# Patient Record
Sex: Male | Born: 1940
Health system: Southern US, Community
[De-identification: ages and names within clinical notes are randomized; demographics above are authoritative.]

## PROBLEM LIST (undated history)

## (undated) DIAGNOSIS — K8050 Calculus of bile duct without cholangitis or cholecystitis without obstruction: Secondary | ICD-10-CM

## (undated) DIAGNOSIS — E785 Hyperlipidemia, unspecified: Secondary | ICD-10-CM

## (undated) DIAGNOSIS — I219 Acute myocardial infarction, unspecified: Secondary | ICD-10-CM

## (undated) DIAGNOSIS — I209 Angina pectoris, unspecified: Secondary | ICD-10-CM

## (undated) DIAGNOSIS — I10 Essential (primary) hypertension: Secondary | ICD-10-CM

## (undated) DIAGNOSIS — E663 Overweight: Secondary | ICD-10-CM

## (undated) DIAGNOSIS — H269 Unspecified cataract: Secondary | ICD-10-CM

## (undated) DIAGNOSIS — H3530 Unspecified macular degeneration: Secondary | ICD-10-CM

## (undated) DIAGNOSIS — I2510 Atherosclerotic heart disease of native coronary artery without angina pectoris: Secondary | ICD-10-CM

## (undated) DIAGNOSIS — G459 Transient cerebral ischemic attack, unspecified: Secondary | ICD-10-CM

## (undated) DIAGNOSIS — K810 Acute cholecystitis: Secondary | ICD-10-CM

## (undated) HISTORY — DX: Unspecified macular degeneration: H35.30

## (undated) HISTORY — PX: CATARACT EXTRACTION W/ INTRAOCULAR LENS  IMPLANT, BILATERAL: SHX1307

## (undated) HISTORY — DX: Acute cholecystitis: K81.0

## (undated) HISTORY — PX: KNEE SURGERY: SHX244

## (undated) HISTORY — DX: Unspecified cataract: H26.9

## (undated) HISTORY — DX: Hyperlipidemia, unspecified: E78.5

## (undated) HISTORY — DX: Essential (primary) hypertension: I10

## (undated) HISTORY — DX: Overweight: E66.3

## (undated) HISTORY — DX: Atherosclerotic heart disease of native coronary artery without angina pectoris: I25.10

---
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
## 1978-11-12 HISTORY — PX: KNEE SURGERY: SHX244

## 1998-04-11 ENCOUNTER — Encounter: Payer: Self-pay | Admitting: Emergency Medicine

## 1998-04-11 ENCOUNTER — Emergency Department (HOSPITAL_COMMUNITY): Admission: EM | Admit: 1998-04-11 | Discharge: 1998-04-11 | Payer: Self-pay | Admitting: Emergency Medicine

## 2000-08-17 ENCOUNTER — Emergency Department (HOSPITAL_COMMUNITY): Admission: EM | Admit: 2000-08-17 | Discharge: 2000-08-17 | Payer: Self-pay

## 2003-01-28 ENCOUNTER — Emergency Department (HOSPITAL_COMMUNITY): Admission: EM | Admit: 2003-01-28 | Discharge: 2003-01-28 | Payer: Self-pay | Admitting: Emergency Medicine

## 2005-03-13 DIAGNOSIS — I2510 Atherosclerotic heart disease of native coronary artery without angina pectoris: Secondary | ICD-10-CM

## 2005-03-13 DIAGNOSIS — I219 Acute myocardial infarction, unspecified: Secondary | ICD-10-CM

## 2005-03-13 HISTORY — DX: Atherosclerotic heart disease of native coronary artery without angina pectoris: I25.10

## 2005-03-13 HISTORY — DX: Acute myocardial infarction, unspecified: I21.9

## 2005-03-16 ENCOUNTER — Inpatient Hospital Stay (HOSPITAL_COMMUNITY): Admission: EM | Admit: 2005-03-16 | Discharge: 2005-03-19 | Payer: Self-pay | Admitting: Emergency Medicine

## 2005-03-17 ENCOUNTER — Encounter: Payer: Self-pay | Admitting: Cardiology

## 2005-03-17 HISTORY — PX: CARDIAC CATHETERIZATION: SHX172

## 2006-10-23 ENCOUNTER — Ambulatory Visit: Payer: Self-pay | Admitting: Internal Medicine

## 2006-11-06 ENCOUNTER — Ambulatory Visit: Payer: Self-pay | Admitting: Internal Medicine

## 2006-11-06 ENCOUNTER — Encounter: Payer: Self-pay | Admitting: Internal Medicine

## 2007-09-24 ENCOUNTER — Encounter: Payer: Self-pay | Admitting: Cardiology

## 2008-03-13 DIAGNOSIS — H269 Unspecified cataract: Secondary | ICD-10-CM

## 2008-03-13 HISTORY — DX: Unspecified cataract: H26.9

## 2010-04-25 ENCOUNTER — Ambulatory Visit (INDEPENDENT_AMBULATORY_CARE_PROVIDER_SITE_OTHER): Payer: Medicare Other | Admitting: Cardiology

## 2010-04-25 DIAGNOSIS — I252 Old myocardial infarction: Secondary | ICD-10-CM

## 2010-04-25 DIAGNOSIS — E7800 Pure hypercholesterolemia, unspecified: Secondary | ICD-10-CM

## 2010-04-25 DIAGNOSIS — I2510 Atherosclerotic heart disease of native coronary artery without angina pectoris: Secondary | ICD-10-CM

## 2010-04-25 DIAGNOSIS — I10 Essential (primary) hypertension: Secondary | ICD-10-CM

## 2010-07-29 NOTE — Discharge Summary (Signed)
[name], [name]              ACCOUNT NO.:  [id number]   MEDICAL RECORD NO.:  [id number]          PATIENT TYPE:  INP   LOCATION:  3735                         FACILITY:  [facility]   PHYSICIAN:  Marcie Mowers, M.D.DATE OF BIRTH:  [birth year]   DATE OF ADMISSION:  03/16/2005  DATE OF DISCHARGE:  03/19/2005                                 DISCHARGE SUMMARY   PRIMARY CARE PHYSICIAN:  The patient does not have a primary care physician.  He does plan to see Dr. Jarome Matin one week after discharge.  He has  the contact information and will call and make appointment.   DISCHARGE DIAGNOSIS:  1.  Acute non-ST-elevation myocardial infarction.  2.  Dyslipidemia.  3.  Overweight with body mass index of 29.  4.  Low-grade fever.   DISCHARGE MEDICATIONS:  1.  Metoprolol 12.5 mg 1 pill twice daily.  2.  Aspirin 325 mg 1 pill daily.  3.  Zocor 40 mg p.o. daily.  4.  Avelox 400 mg 1 pill daily for 7 days.  5.  Sublingual nitroglycerin tablets 0.4 mg 1 pill to be repeated every 5      minutes for a total of 3 times for acute chest pain.   CONSULTATIONS:  Kindly provided by Dr. Peter Swaziland, cardiology.   DISPOSITION:  The patient is discharged to home to follow up with his  primary care physician.   PROCEDURES:  The patient underwent a cardiac catheterization on March 17, 2005.  The findings were as follows.  1.  Left main coronary normal.  2.  A 50 to 70% stenosis of mid LAD.  3.  Occlusion of left circumflex coronary artery without collateral flow.  4.  Right coronary artery large and dominant and normal in appearance.  5.  Focal hypokinesia of mid inferior lateral wall with overall well      preserved left ventricular systolic function and ejection fraction of      55%.   The left circumflex coronary artery was thought to be the culprit lesion.   HISTORY AND PHYSICAL:  For detailed History and Physical, please refer to  the note from March 16, 2004.  Briefly, a  70 year old Caucasian male was  admitted secondary to 3-day history of atypical chest pain which was  intermittent in nature with periodic sharp and dull episodes, and there was  some radiation to the right upper quadrant.  This was not associated with  any nausea, vomiting, or diaphoresis.  The patient's risk factors include  age, family history.   LABORATORY DATA:  For admission lab data, please refer to the H&P note.  Overall, initially 3 sets of cardiac enzymes were ordered.  By the third  set, his troponin had turned positive at 1.35 units.  At this time, the  patient was still with chest pain.  TSH was normal at 0.952.  A fasting  lipid profile was abnormal with total cholesterol of 129, HDL very low at  29, triglycerides at 65 mg/dl, and LDL cholesterol normal at 97 mg/dl.  Urinalysis done on the day of December was normal.  There was some increase  in his white blood cell count on the day of discharge with white blood cell  count being 14,000, hemoglobin 13.5, hematocrit 44.3, platelet count of  177,000.  His basic metabolic panel was within normal limits.   Radiology data: Chest x-ray obtained on March 16, 2005, showed bibasilar  atelectasis with low lung volumes, mild cardiomegaly.  Number 2 chest x-ray  done PA and lateral March 19, 2005, showed improved aeration in lower lobes  with residual left retrocardiac subsegmental atelectasis versus infiltrate  and stable mild cardiomegaly.   HOSPITAL COURSE:  #1.  NON-ST-ELEVATION MYOCARDIAL INFARCTION:  With the chief complaint of  chest pain, the patient was admitted to telemetry.  With cardiac enzymes  turning positive, cardiology was consulted, and the patient underwent  cardiac catheterization with results explained above.  The patient was  medically managed.  He was started on metoprolol, aspirin.  He was placed on  heparin drip until he underwent cardiac catheterization, and he was also  started on a statin.  Overall, his  hospital stay was uneventful.  He did  well after the cardiac catheterization.  His chest pain had resolved by the  day of discharge.  The patient will follow up with Dr. Swaziland in one week  after discharge.   #2.  PYREXIA: The patient had a low-grade temperature of 100 degrees F on  the day of discharge.  Therefore, urinalysis and chest x-ray were obtained  with results as explained above.  The patient did not have any new symptoms  like cough, headache, chest pain, shortness of breath, abdominal complaints,  urinary complaints, etc.  Given no clinical symptoms, only a lab finding of  leukocytosis and low-grade temperature, chest x-ray finding of possible  infiltrate which was improving, the patient was discharged with 7-day course  of Avelox p.o.   #3.  DYSLIPIDEMIA:  The patient was found to have a low HDL of 29.  The  patient was started initially on Crestor, being discharged on Zocor.   Up until this current admission, the patient did not have a primary care  physician and did not have regular medical followup.  The patient does plan  to see Dr. Jarome Matin, has contact information, and will make an  appointment to see him one week after discharge.  The patient will also  follow up with Dr. Swaziland one week after discharge.           ______________________________  Marcie Mowers, M.D.     PMJ/MEDQ  D:  03/19/2005  T:  03/19/2005  Job:  [id number]   cc:   Barry Dienes. Eloise Harman, M.D.  Fax: [phone]   Peter M. Swaziland, M.D.  Fax: [phone]

## 2010-07-29 NOTE — Cardiovascular Report (Signed)
[name], [name]              ACCOUNT NO.:  [id number]   MEDICAL RECORD NO.:  [id number]          PATIENT TYPE:  INP   LOCATION:  3735                         FACILITY:  [facility]   PHYSICIAN:  Peter M. Swaziland, M.D.  DATE OF BIRTH:  1940-12-24   DATE OF PROCEDURE:  03/17/2005  DATE OF DISCHARGE:                              CARDIAC CATHETERIZATION   INDICATIONS FOR PROCEDURE:  The patient is a 70 year old white male who  presents with a non-Q-wave myocardial infarction.  No prior history of  coronary disease or family history of early coronary disease   PROCEDURE:  Left heart catheterization, coronary left ventricular  angiography.   EQUIPMENT USED:  6-French 4 cm right and left Judkins catheter, 6-  Frenchpigtail catheter, 6-French arterial sheath.   ACCESS:  Via the right femoral artery using standard Seldinger technique.   MEDICATIONS:  Local anesthesia 1% Xylocaine.   CONTRAST:  150 mL of Omnipaque.   HEMODYNAMIC DATA:  Aortic pressure was 135/77 with a mean of 101.  Left  ventricle pressure is 137 with EDP of 19 mmHg.   ANGIOGRAPHIC DATA:  The left coronary arises and distributes normally.   Left main coronary appears normal.   The left anterior descending artery has 50-70% stenosis in the mid vessel  and is somewhat eccentric.  The first diagonal is without significant  disease.   The left circumflex coronary is a moderate size vessel.  It is occluded in  the mid vessel without collateral flow   The right coronary artery is a large dominant vessel and appears normal.   Left ventricular angiography was performed in RAO and LAO cranial views.  This demonstrates focal hypokinesia of the mid inferior lateral wall with  overall well-preserved left ventricular systolic function.  Ejection  fraction is estimated at 55%. There is no mitral regurgitation or prolapse.   FINAL INTERPRETATION:  1.  Single-vessel obstructive atherosclerotic coronary disease with total  occlusion of the mid left circumflex      coronary. There is borderline obstructive disease in the mid left      anterior descending.  2.  Well-preserved left ventricular systolic function.   PLAN:  Would recommend medical therapy.           ______________________________  Peter M. Swaziland, M.D.     PMJ/MEDQ  D:  03/17/2005  T:  03/18/2005  Job:  [id number]   cc:   Isidor Holts, M.D.

## 2010-07-29 NOTE — Consult Note (Signed)
[name], [name]              ACCOUNT NO.:  [id number]   MEDICAL RECORD NO.:  [id number]          PATIENT TYPE:  INP   LOCATION:  3735                         FACILITY:  [facility]   PHYSICIAN:  Peter M. Swaziland, M.D.  DATE OF BIRTH:  06/08/1940   DATE OF CONSULTATION:  03/17/2005  DATE OF DISCHARGE:                                   CONSULTATION   HISTORY OF PRESENT ILLNESS:  Mr. Lipsky is a very pleasant 70 year old  white male previously healthy, who presented to the emergency room  department last night for evaluation of chest pain.  Patient reports he had  a GI illness approximately six days ago with nausea, vomiting and diarrhea.  This resolved over the next two days.  Yesterday, he developed acute onset  of lower substernal and epigastric pain and fullness.  There was some  radiation to his right pectoral region.  He had no associated shortness of  breath.  He did have some diaphoresis.  He states the severe pain lasts  approximately 30 minutes.  He came to the emergency room.  He received  sublingual and IV nitroglycerin without relief.  He did finally get relief  after IV morphine.  He had no further chest pain during the night.  Patient  has no history of hypertension or diabetes.  His cholesterol status has been  unknown.  He has no primary physician.   PAST MEDICAL HISTORY:  His past medical history is benign.  He does have a  history of arthritis.   MEDICATIONS:  He has been on no medications.   ALLERGIES:  NO KNOWN ALLERGIES.   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  He is single and has no children.  He denies tobacco or  alcohol use.   FAMILY HISTORY:  Father died at age 54 with myocardial infarction.  His  mother and three sisters are alive and well.   REVIEW OF SYSTEMS:  He has had no further diarrhea or abdominal pain.  He  has had no history of bleeding.  No history of TIA or stroke.  Denies any  edema,  orthopnea, or PND.  Other review of systems are negative.   PHYSICAL EXAMINATION:  GENERAL APPEARANCE:  The patient is an obese white  male in no apparent distress.  VITAL SIGNS:  Blood pressure is 101/60, pulse is 50 and sinus rhythm.  He is  afebrile.  HEENT:  Normocephalic and atraumatic.  Pupils are equal, round, reactive to  light and accommodation.  Extraocular movements are full.  Oropharynx is  clear.  NECK:  Supple without JVD, adenopathy, thyromegaly or bruits.  LUNGS:  Clear to auscultation and percussion.  CARDIOVASCULAR:  Regular rate and rhythm without murmurs, rubs, gallops, or  clicks.  ABDOMEN:  Soft and nontender and obese.  He has no masses or  hepatosplenomegaly.  EXTREMITIES:  His femoral and pedal pulses are 2+ and symmetric.  He has no  edema or cyanosis.  NEUROLOGIC:  Nonfocal.   LABORATORY DATA:  His chest x-ray shows mild cardiomegaly with  bibasilar  atelectasis.   ECG on admission does show some subtle ST elevation inferiorly.  His ECG is  now normal.   Point of care cardiac enzymes were negative x3, but subsequent CPK was 384  with 66.2 MB and troponin was elevated at 1.35.  CBC, BMET and coags are  normal.  BNP level is 29.  D-dimer level is less than 0.22.   IMPRESSION:  1.  Non-Q-wave myocardial infarction.  2.  Obesity.   PLAN:  Will continue on aspirin, IV nitroglycerin and heparin and low dose  beta-blockade.  Will check his lipid status.  Will plan cardiac  catheterization with possible intervention later today.           ______________________________  Peter M. Swaziland, M.D.     PMJ/MEDQ  D:  03/17/2005  T:  03/17/2005  Job:  [id number]   cc:   Isidor Holts, M.D.

## 2010-07-29 NOTE — H&P (Signed)
[name], [name]              ACCOUNT NO.:  [id number]   MEDICAL RECORD NO.:  [id number]          PATIENT TYPE:  EMS   LOCATION:  MAJO                         FACILITY:  [facility]   PHYSICIAN:  Danae Chen, M.D.DATE OF BIRTH:  [birth year]   DATE OF ADMISSION:  03/16/2005  DATE OF DISCHARGE:                                HISTORY & PHYSICAL   PRIMARY CARE PHYSICIAN:  Unassigned.   CHIEF COMPLAINT:  Chest pain.   HISTORY OF PRESENT ILLNESS:  Patient is a pleasant 70 year old gentleman  with no primary care physician who has not sought any medical attention in  many years who presents with a three-day history of nonspecific chest pain.  He describes it as intermittent in nature, sometimes sharp and sometimes  dull in the right upper quadrant.  This chest pain is non-radiating, is not  associated with nausea, vomiting, or diaphoresis.  Not associated with  exertion.  He does get some relief with antacid use.  Cardiac risk factors  include age.  He has never smoked and quit drinking 15 years ago.  Is  unaware whether he has high cholesterol.  No history of hypertension or  diabetes to his knowledge.  His father did die of a heart attack at age 64.  He has siblings which are still alive with no evidence of heart disease that  he is aware of.  He has no recent weight gain or weight loss; however, four  days prior to the onset of his chest discomfort he did have a viral  gastroenteritis with some diarrhea and decreased appetite.   PAST MEDICAL HISTORY:  As noted above.   PAST SURGICAL HISTORY:  Left knee replacement.   FAMILY HISTORY:  Father died of coronary artery disease at age 66.   SOCIAL HISTORY:  He has never smoked and quit drinking 15 years ago.  He is  single, never married.  No children.  He is a retired Academic librarian.   REVIEW OF SYSTEMS:  Per the HPI.   PHYSICAL EXAMINATION:  GENERAL:  Alert and oriented, no acute distress.  VITAL  SIGNS:  Temperature 97.5, blood pressure 120/72, pulse 73, O2  saturation is 97% on room air.  HEENT:  Oropharynx is clear.  Neck is supple.  No lymphadenopathy.  Pupils  are equal and reactive.  LUNGS:  Clear to auscultation.  HEART:  Regular with normal S1, S2.  No murmurs appreciated.  ABDOMEN:  Soft, nontender.  No rebound.  No guarding.  He has no chest wall  tenderness to palpation.  EXTREMITIES:  He has 2+ femoral pulses, 2+ dorsalis pedis pulses.  No  peripheral edema.  NEUROLOGIC:  Nonfocal.   EKG showing normal sinus rhythm.   LABORATORIES:  Sodium 136, potassium 3.7, BUN 15, creatinine 1.1.  Initial  point of care cardiac enzymes:  Myoglobin 136, CK-MB 1.7, troponin less than  0.05.  Second set troponin less than 0.05.  White count of 8.6, hemoglobin  15.9, platelet of 214.  INR of 1.  Chest x-ray showing some bibasilar  atelectasis with mild cardiomegaly.   IMPRESSION:  71 year old with minimal cardiac risk factors presenting with  atypical chest pain.  Given the patient's age and family history it is  advisable to admit the patient to telemetry, get serial cardiac enzymes,  place him on low dose beta blocker, aspirin, pain control, oxygen, and  telemetry monitoring.  In the morning we will ask cardiology to see him for  risk stratification.  He is a good candidate for a Cardiolite study.  Will  keep him n.p.o. after midnight for that.  Also check a fasting lipid panel  and a thyroid panel as well and check a 2-D echocardiogram.      Danae Chen, M.D.  Electronically Signed     RLK/MEDQ  D:  03/16/2005  T:  03/17/2005  Job:  [id number]

## 2010-08-05 ENCOUNTER — Other Ambulatory Visit: Payer: Self-pay | Admitting: *Deleted

## 2010-08-05 MED ORDER — SIMVASTATIN 80 MG PO TABS: 80.0000 mg | ORAL_TABLET | Freq: Every day | ORAL | Status: DC

## 2010-08-05 MED ORDER — METOPROLOL SUCCINATE ER 50 MG PO TB24: 50.0000 mg | ORAL_TABLET | Freq: Every day | ORAL | Status: DC

## 2010-08-05 MED ORDER — RAMIPRIL 10 MG PO CAPS: 10.0000 mg | ORAL_CAPSULE | Freq: Every day | ORAL | Status: DC

## 2010-08-05 NOTE — Telephone Encounter (Signed)
escribe medication per fax request  

## 2011-03-09 ENCOUNTER — Emergency Department (HOSPITAL_COMMUNITY)
Admission: EM | Admit: 2011-03-09 | Discharge: 2011-03-09 | Disposition: A | Discharge status: Home / Self Care | Payer: Medicare Other | Source: Home / Self Care

## 2011-03-09 ENCOUNTER — Encounter: Payer: Self-pay | Admitting: Emergency Medicine

## 2011-03-09 DIAGNOSIS — R05 Cough: Secondary | ICD-10-CM

## 2011-03-09 DIAGNOSIS — J069 Acute upper respiratory infection, unspecified: Secondary | ICD-10-CM

## 2011-03-09 HISTORY — DX: Acute myocardial infarction, unspecified: I21.9

## 2011-03-09 MED ORDER — BENZONATATE 100 MG PO CAPS: ORAL_CAPSULE | ORAL | Status: AC

## 2011-03-09 NOTE — ED Notes (Signed)
Monday night reports chills, "just could't get warm". Then developed a tickle in throat thought to be drainage in throat.   The next day had a cough that has continued.

## 2011-03-09 NOTE — ED Provider Notes (Signed)
Medical screening examination/treatment/procedure(s) were performed by non-physician practitioner and as supervising physician I was immediately available for consultation/collaboration.   KINDL,JAMES DOUGLAS MD.    James Douglas Kindl, MD 03/09/11 1617 

## 2011-03-09 NOTE — ED Provider Notes (Signed)
History     CSN: [id number]  Arrival date & time 03/09/11  1055   None     Chief Complaint  Patient presents with  . URI    (Consider location/radiation/quality/duration/timing/severity/associated sxs/prior treatment) HPI Comments: Pt states she felt chilled 3 nights ago. The next day developed post nasal drainage which is causing a tickle in his throat and making him cough. "Feels just like a feather." The cough is nonproductive. No fever. His symptoms improve with use of cough drops. He was unable to get an appt with his PCP today and is concerned that he may be getting the flu. No dyspnea or wheezing.   The history is provided by the patient.    Past Medical History  Diagnosis Date  . Myocardial infarction   . High cholesterol     Past Surgical History  Procedure Date  . Knee surgery     History reviewed. No pertinent family history.  History  Substance Use Topics  . Smoking status: Never Smoker   . Smokeless tobacco: Not on file  . Alcohol Use: No      Review of Systems  Constitutional: Positive for chills. Negative for fever.  HENT: Positive for postnasal drip. Negative for ear pain, congestion, sore throat, rhinorrhea, sneezing, trouble swallowing and sinus pressure.   Respiratory: Positive for cough. Negative for shortness of breath and wheezing.   Cardiovascular: Negative for chest pain and leg swelling.    Allergies  Review of patient's allergies indicates no known allergies.  Home Medications   Current Outpatient Rx  Name Route Sig Dispense Refill  . ASPIRIN 81 MG PO TABS Oral Take 81 mg by mouth daily.      Marland Kitchen BENZONATATE 100 MG PO CAPS  1-2 caps every 8 hrs prn cough 30 capsule 0  . METOPROLOL SUCCINATE ER 50 MG PO TB24 Oral Take 1 tablet (50 mg total) by mouth daily. 90 tablet 3  . RAMIPRIL 10 MG PO CAPS Oral Take 1 capsule (10 mg total) by mouth daily. 90 capsule 3  . SIMVASTATIN 80 MG PO TABS Oral Take 1 tablet (80 mg total) by mouth at  bedtime. 90 tablet 3    BP 144/84  Pulse 85  Temp(Src) 98.4 F (36.9 C) (Oral)  Resp 16  SpO2 96%  Physical Exam  Nursing note and vitals reviewed. Constitutional: He appears well-developed and well-nourished. No distress.  HENT:  Head: Normocephalic and atraumatic.  Right Ear: Tympanic membrane, external ear and ear canal normal.  Left Ear: Tympanic membrane, external ear and ear canal normal.  Nose: Nose normal.  Mouth/Throat: Uvula is midline, oropharynx is clear and moist and mucous membranes are normal. No oropharyngeal exudate, posterior oropharyngeal edema or posterior oropharyngeal erythema.  Neck: Neck supple.  Cardiovascular: Normal rate, regular rhythm and normal heart sounds.   Pulmonary/Chest: Effort normal and breath sounds normal. No respiratory distress.       No cough noted during visit  Lymphadenopathy:    He has no cervical adenopathy.  Neurological: He is alert.  Skin: Skin is warm and dry.  Psychiatric: He has a normal mood and affect.    ED Course  Procedures (including critical care time)  Labs Reviewed - No data to display No results found.   1. Acute URI   2. Cough       MDM  Exam neg, afebrile.         Melody Comas, [place] 03/09/11 1550

## 2011-03-09 NOTE — ED Notes (Signed)
Provided coke at patient request, ice as well

## 2011-04-26 DIAGNOSIS — H3530 Unspecified macular degeneration: Secondary | ICD-10-CM | POA: Diagnosis not present

## 2011-04-26 DIAGNOSIS — Z961 Presence of intraocular lens: Secondary | ICD-10-CM | POA: Diagnosis not present

## 2011-04-26 DIAGNOSIS — H26499 Other secondary cataract, unspecified eye: Secondary | ICD-10-CM | POA: Diagnosis not present

## 2011-05-25 ENCOUNTER — Ambulatory Visit: Payer: Medicare Other | Admitting: Cardiology

## 2011-05-26 ENCOUNTER — Encounter: Payer: Self-pay | Admitting: Cardiology

## 2011-05-26 ENCOUNTER — Ambulatory Visit (INDEPENDENT_AMBULATORY_CARE_PROVIDER_SITE_OTHER): Payer: Medicare Other | Admitting: Cardiology

## 2011-05-26 VITALS — BP 154/82 | HR 79 | Ht 74.0 in | Wt 238.0 lb

## 2011-05-26 DIAGNOSIS — I10 Essential (primary) hypertension: Secondary | ICD-10-CM

## 2011-05-26 DIAGNOSIS — I219 Acute myocardial infarction, unspecified: Secondary | ICD-10-CM | POA: Insufficient documentation

## 2011-05-26 DIAGNOSIS — I2510 Atherosclerotic heart disease of native coronary artery without angina pectoris: Secondary | ICD-10-CM | POA: Diagnosis not present

## 2011-05-26 DIAGNOSIS — E785 Hyperlipidemia, unspecified: Secondary | ICD-10-CM | POA: Diagnosis not present

## 2011-05-26 NOTE — Assessment & Plan Note (Signed)
Continue simvastatin. We'll followup on his fasting lab work in 3 weeks.

## 2011-05-26 NOTE — Patient Instructions (Signed)
We will schedule you for a nuclear stress test.  Continue your current medication.  I will see you back in 6 months.

## 2011-05-26 NOTE — Assessment & Plan Note (Signed)
Blood pressure is elevated today but he reports it has been better. He can reassess when he comes in for his stress test and he has a visit with Dr. Wylene Simmer in 3 weeks. If it remains elevated we may need to up his medical therapy.

## 2011-05-26 NOTE — Progress Notes (Signed)
[name] Date of Birth: October 29, 1940 Medical Record #[id number]  History of Present Illness: [name] is seen today for followup. He has a known history of coronary disease with a myocardial infarction in 2007. Cardiac catheterization at that time demonstrated occlusion of the mid left circumflex coronary. He had a 50-70% stenosis in the mid LAD that was eccentric. He continues to do very well. He has had no significant chest pain or shortness of breath. He walks 4-5 miles a day 4 days a week. He denies any palpitations or edema. He thinks that his blood pressure has been under control. He is scheduled for complete physical in 3 weeks with Dr. Wylene Simmer and will have blood work done at that time. His last stress test was in July of 2009. He was able to walk 8 minutes on the Bruce protocol. He had 1 mm of ST depression in leads 2, V4 through V6. His Myoview study showed some infarct and ischemia in the inferior lateral wall segments. Ejection fraction was 54%.  Current Outpatient Prescriptions on File Prior to Visit  Medication Sig Dispense Refill  . aspirin 81 MG tablet Take 81 mg by mouth daily.        . metoprolol (TOPROL XL) 50 MG 24 hr tablet Take 1 tablet (50 mg total) by mouth daily.  90 tablet  3  . ramipril (ALTACE) 10 MG capsule Take 1 capsule (10 mg total) by mouth daily.  90 capsule  3  . simvastatin (ZOCOR) 80 MG tablet Take 1 tablet (80 mg total) by mouth at bedtime.  90 tablet  3    No Known Allergies  Past Medical History  Diagnosis Date  . Myocardial infarction 1/07    Acute non-ST-elevation myocardial infarction  . Dyslipidemia   . Overweight     with body mass index of 29  . HTN (hypertension)   . CAD (coronary artery disease) 2007    occlusion of mid LCX, 50-70% LAD    Past Surgical History  Procedure Date  . Knee surgery     arthroscopic left knee surgery  . Cardiac catheterization 03/17/2005    Ejection fraction is estimated at 55%    History    Smoking status  . Never Smoker   Smokeless tobacco  . Not on file    History  Alcohol Use No    Family History  Problem Relation Age of Onset  . Heart disease Father     Review of Systems: As noted in history of present illness.  All other systems were reviewed and are negative.  Physical Exam: BP 154/82  Pulse 79  Ht 6\' 2"  (1.88 m)  Wt 238 lb (107.956 kg)  BMI 30.56 kg/m2 The patient is alert and oriented x 3.  The mood and affect are normal.  The skin is warm and dry.  Color is normal.  The HEENT exam reveals that the sclera are nonicteric.  The mucous membranes are moist.  The carotids are 2+ without bruits.  There is no thyromegaly.  There is no JVD.  The lungs are clear.  The chest wall is non tender.  The heart exam reveals a regular rate with a normal S1 and S2.  There are no murmurs, gallops, or rubs.  The PMI is not displaced.   Abdominal exam reveals good bowel sounds.  There is no guarding or rebound.  There is no hepatosplenomegaly or tenderness.  There are no masses.  Exam of the legs reveal no  clubbing, cyanosis, or edema.  The legs are without rashes.  The distal pulses are intact.  Cranial nerves II - XII are intact.  Motor and sensory functions are intact.  The gait is normal.  LABORATORY DATA: ECG today demonstrates normal sinus rhythm with a normal ECG.  Assessment / Plan:

## 2011-05-26 NOTE — Assessment & Plan Note (Signed)
He has been occlusion of the midcircumflex. He has been asymptomatic. He is due for followup stress test and we will schedule him for a stress Myoview study. Understandably this will continue to show ischemia in the inferior lateral distribution but we are really concerned about any new perfusion abnormality.

## 2011-06-08 DIAGNOSIS — I10 Essential (primary) hypertension: Secondary | ICD-10-CM | POA: Diagnosis not present

## 2011-06-08 DIAGNOSIS — E785 Hyperlipidemia, unspecified: Secondary | ICD-10-CM | POA: Diagnosis not present

## 2011-06-08 DIAGNOSIS — R7301 Impaired fasting glucose: Secondary | ICD-10-CM | POA: Diagnosis not present

## 2011-06-08 DIAGNOSIS — Z125 Encounter for screening for malignant neoplasm of prostate: Secondary | ICD-10-CM | POA: Diagnosis not present

## 2011-06-15 DIAGNOSIS — Z1212 Encounter for screening for malignant neoplasm of rectum: Secondary | ICD-10-CM | POA: Diagnosis not present

## 2011-06-16 ENCOUNTER — Other Ambulatory Visit: Payer: Self-pay | Admitting: Cardiology

## 2011-06-16 DIAGNOSIS — I2510 Atherosclerotic heart disease of native coronary artery without angina pectoris: Secondary | ICD-10-CM | POA: Diagnosis not present

## 2011-06-16 DIAGNOSIS — Z125 Encounter for screening for malignant neoplasm of prostate: Secondary | ICD-10-CM | POA: Diagnosis not present

## 2011-06-16 DIAGNOSIS — E785 Hyperlipidemia, unspecified: Secondary | ICD-10-CM | POA: Diagnosis not present

## 2011-06-16 DIAGNOSIS — Z0000 Encounter for general adult medical examination without abnormal findings: Secondary | ICD-10-CM | POA: Diagnosis not present

## 2011-06-16 DIAGNOSIS — I10 Essential (primary) hypertension: Secondary | ICD-10-CM | POA: Diagnosis not present

## 2011-07-04 ENCOUNTER — Other Ambulatory Visit: Payer: Self-pay | Admitting: *Deleted

## 2011-07-04 MED ORDER — RAMIPRIL 10 MG PO CAPS: 10.0000 mg | ORAL_CAPSULE | Freq: Every day | ORAL | Status: DC

## 2011-07-04 MED ORDER — METOPROLOL SUCCINATE ER 50 MG PO TB24: 50.0000 mg | ORAL_TABLET | Freq: Every day | ORAL | Status: DC

## 2011-07-04 MED ORDER — SIMVASTATIN 80 MG PO TABS: 80.0000 mg | ORAL_TABLET | Freq: Every day | ORAL | Status: DC

## 2011-07-05 ENCOUNTER — Other Ambulatory Visit: Payer: Self-pay | Admitting: Cardiology

## 2011-07-05 NOTE — Telephone Encounter (Signed)
Pt needs refill of metoprolol 50mg , ramipril 10mg , and simvastatin 80mg  called into [facility]

## 2011-07-10 ENCOUNTER — Encounter (HOSPITAL_COMMUNITY): Payer: Medicare Other

## 2011-08-08 ENCOUNTER — Ambulatory Visit (HOSPITAL_COMMUNITY): Payer: Medicare Other

## 2011-08-14 ENCOUNTER — Ambulatory Visit (HOSPITAL_COMMUNITY): Payer: Medicare Other | Attending: Cardiology | Admitting: Radiology

## 2011-08-14 VITALS — BP 163/93 | Ht 74.0 in | Wt 234.0 lb

## 2011-08-14 DIAGNOSIS — R079 Chest pain, unspecified: Secondary | ICD-10-CM

## 2011-08-14 DIAGNOSIS — I2510 Atherosclerotic heart disease of native coronary artery without angina pectoris: Secondary | ICD-10-CM | POA: Insufficient documentation

## 2011-08-14 DIAGNOSIS — I252 Old myocardial infarction: Secondary | ICD-10-CM | POA: Insufficient documentation

## 2011-08-14 DIAGNOSIS — I10 Essential (primary) hypertension: Secondary | ICD-10-CM | POA: Diagnosis not present

## 2011-08-14 DIAGNOSIS — R61 Generalized hyperhidrosis: Secondary | ICD-10-CM | POA: Diagnosis not present

## 2011-08-14 DIAGNOSIS — E785 Hyperlipidemia, unspecified: Secondary | ICD-10-CM | POA: Insufficient documentation

## 2011-08-14 DIAGNOSIS — Z8249 Family history of ischemic heart disease and other diseases of the circulatory system: Secondary | ICD-10-CM | POA: Diagnosis not present

## 2011-08-14 MED ORDER — TECHNETIUM TC 99M TETROFOSMIN IV KIT
11.0000 | PACK | Freq: Once | INTRAVENOUS | Status: AC | PRN
  Administered 2011-08-14: 11 via INTRAVENOUS

## 2011-08-14 MED ORDER — TECHNETIUM TC 99M TETROFOSMIN IV KIT
33.0000 | PACK | Freq: Once | INTRAVENOUS | Status: AC | PRN
  Administered 2011-08-14: 33 via INTRAVENOUS

## 2011-08-14 NOTE — Progress Notes (Signed)
[facility] SITE 3 NUCLEAR MED [address] [place] [address] [phone]  Cardiology Nuclear Med Study  [name] is a 71 y.o. male     MRN : [id number]     DOB: April 21, 1940  Procedure Date: 08/14/2011  Nuclear Med Background Indication for Stress Test:  Evaluation for Ischemia with history of Known ischemia in inferolateral region History:  '07 MI,Heart Catherization:CFX occluded and LAD 50-70% eccentric and EF=55%;Echo:EF=55-65% and '09 MPS:EF=54% and infarct/ischemia of inferolateral wall Cardiac Risk Factors: Family History - CAD, Hypertension and Lipids  Symptoms:  Diaphoresis   Nuclear Pre-Procedure Caffeine/Decaff Intake:  None > 12 hrs NPO After: 7:00pm   Lungs:  clear  IV 0.9% NS with Angio Cath:  20g  IV Site: R Hand x 1, tolerated well IV Started by:  Irean Hong, RN  Chest Size (in):  46 Cup Size: n/a  Height: 6\' 2"  (1.88 m)  Weight:  234 lb (106.142 kg)  BMI:  Body mass index is 30.04 kg/(m^2). Tech Comments:  Held toprol x 24 hrs    Nuclear Med Study 1 or 2 day study: 1 day  Stress Test Type:  Stress  Reading MD: Willa Rough, MD  Order Authorizing Provider:  Peter Swaziland, MD  Resting Radionuclide: Technetium 58m Tetrofosmin  Resting Radionuclide Dose: 11.0 mCi   Stress Radionuclide:  Technetium 60m Tetrofosmin  Stress Radionuclide Dose: 33.0 mCi           Stress Protocol Rest HR: 77 Stress HR: 136  Rest BP: 163/93 Stress BP: 206/94  Exercise Time (min): 6:49 METS: 7.0   Predicted Max HR: 148 bpm % Max HR: 91.89 bpm Rate Pressure Product: 84696   Dose of Adenosine (mg):  n/a Dose of Lexiscan: n/a mg  Dose of Atropine (mg): n/a Dose of Dobutamine: n/a mcg/kg/min (at max HR)  Stress Test Technologist: Cathlyn Parsons, RN  Nuclear Technologist:  Domenic Polite, CNMT     Rest Procedure:  Myocardial perfusion imaging was performed at rest 45 minutes following the intravenous administration of Technetium 17m  Tetrofosmin. Rest ECG: NSR - Normal EKG  Stress Procedure:  The patient performed treadmill exercise using a Bruce  Protocol for 6:49 minutes. The patient stopped due to fatigue and patient had chest tightness 2/10.  There were  ST-T wave changes noted. Patient had PVC's with couplets and rare PAC. Technetium 21m Tetrofosmin was injected at peak exercise and myocardial perfusion imaging was performed after a brief delay. Stress ECG: There is mild inferolateral ST depression that occurs at peak stress and in the early recovery period.  QPS Raw Data Images:  Patient motion noted; appropriate software correction applied. Stress Images:  There is a moderate area of myocardium affected  with decreased activity with stress. This includes moderate decreased activity at the base of the inferior wall, moderate to severe decreased activity at the base and mid and apical inferolateral wall. There is also mild to moderate decreased activity at the base and mid anterolateral wall. Rest Images:  At rest there is only mild decreased activity at the base of the inferior wall. There is only moderate decreased activity in the segments of the inferolateral wall. There is mild decreased activity at the base and mid anterolateral wall. Subtraction (SDS):  There is reversibility in the segments that are outlined above. This is compatible with some peri-infarct ischemia in the inferior, inferolateral, and anterolateral walls. Transient Ischemic Dilatation (Normal <1.22):  1.01 Lung/Heart Ratio (Normal <0.45):  0.35  Quantitative Gated Spect Images QGS EDV:  101 ml QGS ESV:  40 ml  Impression Exercise Capacity:  There is fair exercise capacity. BP Response:  Early in stress the patient's systolic pressure increases from 160-206. The pressure stays in this area during stress. Clinical Symptoms:  The patient did have mild chest pain. ECG Impression:  EKGs reveal mild ST changes. This is compatible with mild ischemia. In  addition,  two ventricular couplets are seen during stress. Comparison with Prior Nuclear Study:   Please refer to the comments below  Overall Impression:  The study is abnormal. There is a moderate sized scar that affects the base of the inferior wall, base and mid inferolateral wall, and the base and mid anterolateral wall. There is also moderate ischemia within this area. There is a paper copy  of the original hardcopy from the study of 2009. The impression is given that the current findings are similar to 2009. However it is impossible to compare them completely.  LV Ejection Fraction: 60%.  LV Wall Motion:  There is some decreased motion in the inferolateral wall. However overall ejection fraction is well maintained.  Willa Rough, MD

## 2011-09-15 DIAGNOSIS — H35059 Retinal neovascularization, unspecified, unspecified eye: Secondary | ICD-10-CM | POA: Diagnosis not present

## 2011-09-15 DIAGNOSIS — Z961 Presence of intraocular lens: Secondary | ICD-10-CM | POA: Diagnosis not present

## 2011-09-15 DIAGNOSIS — H3530 Unspecified macular degeneration: Secondary | ICD-10-CM | POA: Diagnosis not present

## 2011-09-15 DIAGNOSIS — H353290 Exudative age-related macular degeneration, unspecified eye, stage unspecified: Secondary | ICD-10-CM | POA: Diagnosis not present

## 2011-10-03 DIAGNOSIS — H353290 Exudative age-related macular degeneration, unspecified eye, stage unspecified: Secondary | ICD-10-CM | POA: Diagnosis not present

## 2011-10-03 DIAGNOSIS — H35059 Retinal neovascularization, unspecified, unspecified eye: Secondary | ICD-10-CM | POA: Diagnosis not present

## 2011-10-24 ENCOUNTER — Encounter: Payer: Self-pay | Admitting: Internal Medicine

## 2011-11-10 DIAGNOSIS — H35059 Retinal neovascularization, unspecified, unspecified eye: Secondary | ICD-10-CM | POA: Diagnosis not present

## 2011-11-10 DIAGNOSIS — H353290 Exudative age-related macular degeneration, unspecified eye, stage unspecified: Secondary | ICD-10-CM | POA: Diagnosis not present

## 2011-12-01 ENCOUNTER — Emergency Department (HOSPITAL_COMMUNITY): Payer: Medicare Other

## 2011-12-01 ENCOUNTER — Inpatient Hospital Stay (HOSPITAL_COMMUNITY)
Admission: EM | Admit: 2011-12-01 | Discharge: 2011-12-07 | DRG: 057 | Disposition: A | Discharge status: Skilled Nursing Facility | Payer: Medicare Other | Attending: Internal Medicine | Admitting: Internal Medicine

## 2011-12-01 ENCOUNTER — Encounter (HOSPITAL_COMMUNITY): Payer: Self-pay | Admitting: Emergency Medicine

## 2011-12-01 DIAGNOSIS — I6789 Other cerebrovascular disease: Secondary | ICD-10-CM | POA: Diagnosis not present

## 2011-12-01 DIAGNOSIS — Z23 Encounter for immunization: Secondary | ICD-10-CM

## 2011-12-01 DIAGNOSIS — G20A1 Parkinson's disease without dyskinesia, without mention of fluctuations: Principal | ICD-10-CM | POA: Diagnosis present

## 2011-12-01 DIAGNOSIS — E46 Unspecified protein-calorie malnutrition: Secondary | ICD-10-CM | POA: Diagnosis present

## 2011-12-01 DIAGNOSIS — I10 Essential (primary) hypertension: Secondary | ICD-10-CM | POA: Diagnosis present

## 2011-12-01 DIAGNOSIS — R509 Fever, unspecified: Secondary | ICD-10-CM | POA: Diagnosis not present

## 2011-12-01 DIAGNOSIS — Z7982 Long term (current) use of aspirin: Secondary | ICD-10-CM

## 2011-12-01 DIAGNOSIS — R5383 Other fatigue: Secondary | ICD-10-CM | POA: Diagnosis not present

## 2011-12-01 DIAGNOSIS — E860 Dehydration: Secondary | ICD-10-CM | POA: Diagnosis not present

## 2011-12-01 DIAGNOSIS — J0100 Acute maxillary sinusitis, unspecified: Secondary | ICD-10-CM | POA: Diagnosis present

## 2011-12-01 DIAGNOSIS — D72829 Elevated white blood cell count, unspecified: Secondary | ICD-10-CM | POA: Diagnosis present

## 2011-12-01 DIAGNOSIS — E538 Deficiency of other specified B group vitamins: Secondary | ICD-10-CM | POA: Diagnosis not present

## 2011-12-01 DIAGNOSIS — E785 Hyperlipidemia, unspecified: Secondary | ICD-10-CM | POA: Diagnosis present

## 2011-12-01 DIAGNOSIS — R410 Disorientation, unspecified: Secondary | ICD-10-CM | POA: Diagnosis present

## 2011-12-01 DIAGNOSIS — Z79899 Other long term (current) drug therapy: Secondary | ICD-10-CM

## 2011-12-01 DIAGNOSIS — G20 Parkinson's disease: Principal | ICD-10-CM | POA: Diagnosis present

## 2011-12-01 DIAGNOSIS — E663 Overweight: Secondary | ICD-10-CM | POA: Diagnosis present

## 2011-12-01 DIAGNOSIS — D696 Thrombocytopenia, unspecified: Secondary | ICD-10-CM | POA: Diagnosis present

## 2011-12-01 DIAGNOSIS — I252 Old myocardial infarction: Secondary | ICD-10-CM

## 2011-12-01 DIAGNOSIS — R531 Weakness: Secondary | ICD-10-CM | POA: Diagnosis present

## 2011-12-01 DIAGNOSIS — R6889 Other general symptoms and signs: Secondary | ICD-10-CM | POA: Diagnosis not present

## 2011-12-01 DIAGNOSIS — R404 Transient alteration of awareness: Secondary | ICD-10-CM | POA: Diagnosis not present

## 2011-12-01 DIAGNOSIS — I2510 Atherosclerotic heart disease of native coronary artery without angina pectoris: Secondary | ICD-10-CM | POA: Diagnosis present

## 2011-12-01 DIAGNOSIS — R269 Unspecified abnormalities of gait and mobility: Secondary | ICD-10-CM | POA: Diagnosis present

## 2011-12-01 HISTORY — DX: Angina pectoris, unspecified: I20.9

## 2011-12-01 LAB — CBC WITH DIFFERENTIAL/PLATELET
Basophils Absolute: 0 10*3/uL (ref 0.0–0.1)
Basophils Relative: 0 % (ref 0–1)
Lymphocytes Relative: 12 % (ref 12–46)
MCHC: 36 g/dL (ref 30.0–36.0)
Neutro Abs: 9.9 10*3/uL — ABNORMAL HIGH (ref 1.7–7.7)
Neutrophils Relative %: 82 % — ABNORMAL HIGH (ref 43–77)
Platelets: 134 10*3/uL — ABNORMAL LOW (ref 150–400)
RDW: 12.8 % (ref 11.5–15.5)
WBC: 12 10*3/uL — ABNORMAL HIGH (ref 4.0–10.5)

## 2011-12-01 LAB — BASIC METABOLIC PANEL
Chloride: 98 mEq/L (ref 96–112)
Creatinine, Ser: 1.01 mg/dL (ref 0.50–1.35)
GFR calc Af Amer: 84 mL/min — ABNORMAL LOW (ref >90)
Potassium: 3.4 mEq/L — ABNORMAL LOW (ref 3.5–5.1)
Sodium: 135 mEq/L (ref 135–145)

## 2011-12-01 LAB — AMMONIA: Ammonia: 10 umol/L — ABNORMAL LOW (ref 11–60)

## 2011-12-01 MED ORDER — SODIUM CHLORIDE 0.9 % IV SOLN
1000.0000 mL | Freq: Once | INTRAVENOUS | Status: AC
  Administered 2011-12-01: 1000 mL via INTRAVENOUS

## 2011-12-01 MED ORDER — SODIUM CHLORIDE 0.9 % IV SOLN
1000.0000 mL | INTRAVENOUS | Status: DC
  Administered 2011-12-02: 1000 mL via INTRAVENOUS

## 2011-12-01 MED ORDER — SODIUM CHLORIDE 0.9 % IV SOLN
1000.0000 mL | Freq: Once | INTRAVENOUS | Status: AC
  Administered 2011-12-02: 1000 mL via INTRAVENOUS

## 2011-12-01 NOTE — ED Notes (Signed)
Pt here with generalized body aches and fever with weakness x 1 week; pt denies cough

## 2011-12-01 NOTE — ED Provider Notes (Signed)
History     CSN: [id number]  Arrival date & time 12/01/11  1729   First MD Initiated Contact with Patient 12/01/11 2122      Chief Complaint  Patient presents with  . Weakness  . Generalized Body Aches  . Fever    (Consider location/radiation/quality/duration/timing/severity/associated sxs/prior treatment) HPI Comments: [name] is a 71 y.o. Male who has been ill for several days with general achiness, low-grade fever, and decreased appetite. He has not taken his temperature at home. When he stands to walk he is unsteady. His sister feels like he is very different from his baseline. He denies recent fever, chills, nausea, vomiting, weakness, or dizziness. He is taking his usual medications. There are no other aggravating or palliative factors.  Patient is a 71 y.o. male presenting with weakness and fever. The history is provided by the patient.  Weakness The primary symptoms include fever.  Additional symptoms include weakness.  Fever Primary symptoms of the febrile illness include fever.    Past Medical History  Diagnosis Date  . Myocardial infarction 1/07    Acute non-ST-elevation myocardial infarction  . Dyslipidemia   . Overweight     with body mass index of 29  . HTN (hypertension)   . CAD (coronary artery disease) 2007    occlusion of mid LCX, 50-70% LAD    Past Surgical History  Procedure Date  . Knee surgery     arthroscopic left knee surgery  . Cardiac catheterization 03/17/2005    Ejection fraction is estimated at 55%    Family History  Problem Relation Age of Onset  . Heart disease Father     History  Substance Use Topics  . Smoking status: Never Smoker   . Smokeless tobacco: Not on file  . Alcohol Use: No      Review of Systems  Constitutional: Positive for fever.  Neurological: Positive for weakness.  All other systems reviewed and are negative.    Allergies  Review of patient's allergies indicates no known allergies.  Home  Medications   Current Outpatient Rx  Name Route Sig Dispense Refill  . ASPIRIN 81 MG PO TABS Oral Take 81 mg by mouth daily.      Marland Kitchen METOPROLOL SUCCINATE ER 50 MG PO TB24 Oral Take 1 tablet (50 mg total) by mouth daily. 90 tablet 3  . RAMIPRIL 10 MG PO CAPS Oral Take 1 capsule (10 mg total) by mouth daily. 90 capsule 3  . SIMVASTATIN 80 MG PO TABS Oral Take 1 tablet (80 mg total) by mouth at bedtime. 90 tablet 3    BP 139/79  Pulse 113  Temp 98.2 F (36.8 C) (Oral)  Resp 18  SpO2 100%  Physical Exam  Nursing note and vitals reviewed. Constitutional: He is oriented to person, place, and time. He appears well-developed and well-nourished.  HENT:  Head: Normocephalic and atraumatic.  Right Ear: External ear normal.  Left Ear: External ear normal.       Dry mucous membranes  Eyes: Conjunctivae normal and EOM are normal. Pupils are equal, round, and reactive to light.  Neck: Normal range of motion and phonation normal. Neck supple.  Cardiovascular: Normal rate, regular rhythm, normal heart sounds and intact distal pulses.   Pulmonary/Chest: Effort normal and breath sounds normal. No respiratory distress. He has no wheezes. He exhibits no bony tenderness.  Abdominal: Soft. Normal appearance. There is no tenderness.  Musculoskeletal: Normal range of motion. He exhibits no edema and no tenderness.  Mild weakness, requiring help to sit up, and stay in a seated position on a stretcher.  Neurological: He is alert and oriented to person, place, and time. He has normal strength. No cranial nerve deficit or sensory deficit. He exhibits normal muscle tone. Coordination normal.  Skin: Skin is warm, dry and intact.  Psychiatric: He has a normal mood and affect. His behavior is normal. Judgment and thought content normal.    ED Course  Procedures (including critical care time) IV fluid bolus, 2 L, and maintenance fluids, with normal saline  Orthostatics positive for  hypotension  Ambulation trial. He was able to ambulate, with mild one person assistance    Labs Reviewed  CBC WITH DIFFERENTIAL - Abnormal; Notable for the following:    WBC 12.0 (*)     Platelets 134 (*)     Neutrophils Relative 82 (*)     Neutro Abs 9.9 (*)     All other components within normal limits  BASIC METABOLIC PANEL - Abnormal; Notable for the following:    Potassium 3.4 (*)     Glucose, Bld 141 (*)     GFR calc non Af Amer 72 (*)     GFR calc Af Amer 84 (*)     All other components within normal limits  AMMONIA - Abnormal; Notable for the following:    Ammonia 10 (*)     All other components within normal limits  URINALYSIS, ROUTINE W REFLEX MICROSCOPIC  URINE RAPID DRUG SCREEN (HOSP PERFORMED)  BLOOD GAS, ARTERIAL   Dg Chest 2 View  12/01/2011  *RADIOLOGY REPORT*  Clinical Data: Weakness.  Generalized body aches.  Fever.  CHEST - 2 VIEW  Comparison: Chest x-ray 03/19/2005.  Findings: Lung volumes are low.  No definite consolidative airspace disease.  No pleural effusions.  Pulmonary vasculature and the cardiomediastinal silhouette are within normal limits allowing for the low lung volumes and patient rotation to the left. Atherosclerosis in the thoracic aorta.  IMPRESSION: 1.  Low lung volumes without radiographic evidence of acute cardiopulmonary disease. 2.  Atherosclerosis.   Original Report Authenticated By: Florencia Reasons, M.D.    Ct Head Wo Contrast  12/01/2011  *RADIOLOGY REPORT*  Clinical Data: Weakness, fever, body aches  CT HEAD WITHOUT CONTRAST  Technique:  Contiguous axial images were obtained from the base of the skull through the vertex without contrast.  Comparison: None.  Findings: No evidence of parenchymal hemorrhage or extra-axial fluid collection. No mass lesion, mass effect, or midline shift.  No CT evidence of acute infarction.  Old right basal ganglia lacunar infarct.  Subcortical white matter and periventricular small vessel ischemic changes.  Intracranial atherosclerosis.  Near complete opacification of the left maxillary sinus, likely chronic.  The mastoid air cells are unopacified.  No evidence of calvarial fracture.  IMPRESSION: No evidence of acute intracranial abnormality.  Near complete opacification of the left maxillary sinus, likely chronic.   Original Report Authenticated By: Charline Bills, M.D.      1. Delirium   2. Dehydration       MDM  Nonspecific delirium, with dehydration. No evident source for infection. Doubt encephalitis, or meningitis. No significant metabolic instability. Doubt impending vascular collapse.   Plan: Admit- PCP service       Flint Melter, MD 12/02/11 [phone]

## 2011-12-02 ENCOUNTER — Encounter (HOSPITAL_COMMUNITY): Payer: Self-pay | Admitting: Internal Medicine

## 2011-12-02 DIAGNOSIS — R488 Other symbolic dysfunctions: Secondary | ICD-10-CM | POA: Diagnosis not present

## 2011-12-02 DIAGNOSIS — R509 Fever, unspecified: Secondary | ICD-10-CM | POA: Diagnosis not present

## 2011-12-02 DIAGNOSIS — R404 Transient alteration of awareness: Secondary | ICD-10-CM | POA: Diagnosis not present

## 2011-12-02 DIAGNOSIS — E46 Unspecified protein-calorie malnutrition: Secondary | ICD-10-CM | POA: Diagnosis present

## 2011-12-02 DIAGNOSIS — E539 Vitamin B deficiency, unspecified: Secondary | ICD-10-CM | POA: Diagnosis not present

## 2011-12-02 DIAGNOSIS — G20 Parkinson's disease: Secondary | ICD-10-CM | POA: Diagnosis not present

## 2011-12-02 DIAGNOSIS — Z23 Encounter for immunization: Secondary | ICD-10-CM | POA: Diagnosis not present

## 2011-12-02 DIAGNOSIS — I252 Old myocardial infarction: Secondary | ICD-10-CM | POA: Diagnosis not present

## 2011-12-02 DIAGNOSIS — E860 Dehydration: Secondary | ICD-10-CM | POA: Diagnosis not present

## 2011-12-02 DIAGNOSIS — I119 Hypertensive heart disease without heart failure: Secondary | ICD-10-CM | POA: Diagnosis not present

## 2011-12-02 DIAGNOSIS — R5381 Other malaise: Secondary | ICD-10-CM | POA: Diagnosis not present

## 2011-12-02 DIAGNOSIS — D696 Thrombocytopenia, unspecified: Secondary | ICD-10-CM | POA: Diagnosis present

## 2011-12-02 DIAGNOSIS — R269 Unspecified abnormalities of gait and mobility: Secondary | ICD-10-CM | POA: Diagnosis present

## 2011-12-02 DIAGNOSIS — Z79899 Other long term (current) drug therapy: Secondary | ICD-10-CM | POA: Diagnosis not present

## 2011-12-02 DIAGNOSIS — E785 Hyperlipidemia, unspecified: Secondary | ICD-10-CM | POA: Diagnosis present

## 2011-12-02 DIAGNOSIS — R279 Unspecified lack of coordination: Secondary | ICD-10-CM | POA: Diagnosis not present

## 2011-12-02 DIAGNOSIS — E663 Overweight: Secondary | ICD-10-CM | POA: Diagnosis present

## 2011-12-02 DIAGNOSIS — R531 Weakness: Secondary | ICD-10-CM | POA: Diagnosis present

## 2011-12-02 DIAGNOSIS — D72829 Elevated white blood cell count, unspecified: Secondary | ICD-10-CM | POA: Diagnosis present

## 2011-12-02 DIAGNOSIS — I2510 Atherosclerotic heart disease of native coronary artery without angina pectoris: Secondary | ICD-10-CM | POA: Diagnosis present

## 2011-12-02 DIAGNOSIS — R4182 Altered mental status, unspecified: Secondary | ICD-10-CM | POA: Diagnosis not present

## 2011-12-02 DIAGNOSIS — I10 Essential (primary) hypertension: Secondary | ICD-10-CM | POA: Diagnosis present

## 2011-12-02 DIAGNOSIS — J0100 Acute maxillary sinusitis, unspecified: Secondary | ICD-10-CM | POA: Diagnosis present

## 2011-12-02 DIAGNOSIS — R262 Difficulty in walking, not elsewhere classified: Secondary | ICD-10-CM | POA: Diagnosis not present

## 2011-12-02 DIAGNOSIS — M6281 Muscle weakness (generalized): Secondary | ICD-10-CM | POA: Diagnosis not present

## 2011-12-02 DIAGNOSIS — G20A1 Parkinson's disease without dyskinesia, without mention of fluctuations: Secondary | ICD-10-CM | POA: Diagnosis not present

## 2011-12-02 DIAGNOSIS — J329 Chronic sinusitis, unspecified: Secondary | ICD-10-CM | POA: Diagnosis not present

## 2011-12-02 DIAGNOSIS — R410 Disorientation, unspecified: Secondary | ICD-10-CM | POA: Diagnosis present

## 2011-12-02 DIAGNOSIS — Z7982 Long term (current) use of aspirin: Secondary | ICD-10-CM | POA: Diagnosis not present

## 2011-12-02 DIAGNOSIS — E538 Deficiency of other specified B group vitamins: Secondary | ICD-10-CM | POA: Diagnosis not present

## 2011-12-02 LAB — URINALYSIS, ROUTINE W REFLEX MICROSCOPIC
Ketones, ur: 40 mg/dL — AB
Leukocytes, UA: NEGATIVE
Nitrite: NEGATIVE
Specific Gravity, Urine: 1.035 — ABNORMAL HIGH (ref 1.005–1.030)
pH: 5.5 (ref 5.0–8.0)

## 2011-12-02 LAB — URINE MICROSCOPIC-ADD ON

## 2011-12-02 LAB — CREATININE, SERUM
Creatinine, Ser: 1.02 mg/dL (ref 0.50–1.35)
GFR calc Af Amer: 83 mL/min — ABNORMAL LOW (ref >90)

## 2011-12-02 LAB — RAPID URINE DRUG SCREEN, HOSP PERFORMED
Barbiturates: NOT DETECTED
Cocaine: NOT DETECTED

## 2011-12-02 LAB — CBC
Hemoglobin: 15.2 g/dL (ref 13.0–17.0)
MCV: 86.2 fL (ref 78.0–100.0)
Platelets: 110 10*3/uL — ABNORMAL LOW (ref 150–400)
RBC: 4.94 MIL/uL (ref 4.22–5.81)
WBC: 12.4 10*3/uL — ABNORMAL HIGH (ref 4.0–10.5)

## 2011-12-02 LAB — INFLUENZA PANEL BY PCR (TYPE A & B): Influenza A By PCR: NEGATIVE

## 2011-12-02 MED ORDER — ASPIRIN 81 MG PO TABS: 81.0000 mg | ORAL_TABLET | Freq: Every day | ORAL | Status: DC

## 2011-12-02 MED ORDER — SODIUM CHLORIDE 0.9 % IJ SOLN
3.0000 mL | Freq: Two times a day (BID) | INTRAMUSCULAR | Status: DC
  Administered 2011-12-05 – 2011-12-06 (×2): 3 mL via INTRAVENOUS

## 2011-12-02 MED ORDER — ACETAMINOPHEN 650 MG RE SUPP: 650.0000 mg | Freq: Four times a day (QID) | RECTAL | Status: DC | PRN

## 2011-12-02 MED ORDER — ALUM & MAG HYDROXIDE-SIMETH 200-200-20 MG/5ML PO SUSP: 30.0000 mL | Freq: Four times a day (QID) | ORAL | Status: DC | PRN

## 2011-12-02 MED ORDER — ONDANSETRON HCL 4 MG/2ML IJ SOLN
4.0000 mg | Freq: Three times a day (TID) | INTRAMUSCULAR | Status: AC | PRN
  Administered 2011-12-02: 4 mg via INTRAVENOUS
  Filled 2011-12-02: qty 2

## 2011-12-02 MED ORDER — ASPIRIN EC 81 MG PO TBEC
81.0000 mg | DELAYED_RELEASE_TABLET | Freq: Every day | ORAL | Status: DC
  Administered 2011-12-02 – 2011-12-07 (×6): 81 mg via ORAL
  Filled 2011-12-02 (×6): qty 1

## 2011-12-02 MED ORDER — ACETAMINOPHEN 325 MG PO TABS: 650.0000 mg | ORAL_TABLET | Freq: Four times a day (QID) | ORAL | Status: DC | PRN

## 2011-12-02 MED ORDER — ASPIRIN EC 81 MG PO TBEC: 81.0000 mg | DELAYED_RELEASE_TABLET | Freq: Every day | ORAL | Status: DC

## 2011-12-02 MED ORDER — ZOLPIDEM TARTRATE 5 MG PO TABS: 5.0000 mg | ORAL_TABLET | Freq: Every evening | ORAL | Status: DC | PRN

## 2011-12-02 MED ORDER — POTASSIUM CHLORIDE IN NACL 20-0.9 MEQ/L-% IV SOLN
INTRAVENOUS | Status: DC
  Administered 2011-12-02 – 2011-12-06 (×4): via INTRAVENOUS
  Filled 2011-12-02 (×8): qty 1000

## 2011-12-02 MED ORDER — OXYMETAZOLINE HCL 0.05 % NA SOLN
1.0000 | Freq: Two times a day (BID) | NASAL | Status: DC
  Administered 2011-12-02 – 2011-12-06 (×8): 1 via NASAL
  Filled 2011-12-02: qty 15

## 2011-12-02 MED ORDER — ENOXAPARIN SODIUM 40 MG/0.4ML ~~LOC~~ SOLN
40.0000 mg | SUBCUTANEOUS | Status: DC
  Administered 2011-12-02 – 2011-12-03 (×2): 40 mg via SUBCUTANEOUS
  Filled 2011-12-02 (×3): qty 0.4

## 2011-12-02 MED ORDER — METOPROLOL SUCCINATE ER 50 MG PO TB24
50.0000 mg | ORAL_TABLET | Freq: Every day | ORAL | Status: DC
  Administered 2011-12-02 – 2011-12-07 (×6): 50 mg via ORAL
  Filled 2011-12-02 (×6): qty 1

## 2011-12-02 MED ORDER — PROMETHAZINE HCL 12.5 MG PO TABS: 12.5000 mg | ORAL_TABLET | Freq: Four times a day (QID) | ORAL | Status: DC | PRN

## 2011-12-02 MED ORDER — SODIUM CHLORIDE 0.9 % IV SOLN
INTRAVENOUS | Status: DC
  Administered 2011-12-02: 04:00:00 via INTRAVENOUS

## 2011-12-02 MED ORDER — ATORVASTATIN CALCIUM 40 MG PO TABS
40.0000 mg | ORAL_TABLET | Freq: Every day | ORAL | Status: DC
  Administered 2011-12-02 – 2011-12-06 (×5): 40 mg via ORAL
  Filled 2011-12-02 (×6): qty 1

## 2011-12-02 MED ORDER — DEXTROSE 5 % IV SOLN
1.0000 g | INTRAVENOUS | Status: DC
  Administered 2011-12-02 – 2011-12-03 (×2): 1 g via INTRAVENOUS
  Filled 2011-12-02 (×4): qty 10

## 2011-12-02 NOTE — ED Notes (Signed)
Darcie (sister): [phone] (cell), [phone] (home).

## 2011-12-02 NOTE — H&P (Signed)
[name] is an 71 y.o. male.   Chief Complaint: I feel very weak HPI:  The patient is a 71 year old Caucasian man with several medical problems who was brought to the emergency room by his sister for evaluation of several days of generalized achiness, low-grade fever, and markedly decreased food and fluid intake. He has recently become unsteady with gait as very different from his baseline, according to his sister who was present during exam, [name] (cell telephone [phone] and home telephone [phone]). The patient also complained of mild rhinitis, but denied headache, change in vision, neck pain, cough, shortness of breath, chest pain, abdominal pain, nausea, vomiting, diarrhea, dysuria, frequency, or recent fall. This evening he had a workup by the emergency room physician there was most significant for leukocytosis with a head CT scan indicating left maxillary sinusitis. Because of his profound weakness associated with dehydration and gait instability, he was admitted for further evaluation.  Past Medical History  Diagnosis Date  . Myocardial infarction 1/07    Acute non-ST-elevation myocardial infarction  . Dyslipidemia   . Overweight     with body mass index of 29  . HTN (hypertension)   . CAD (coronary artery disease) 2007    occlusion of mid LCX, 50-70% LAD    Medications Prior to Admission  Medication Sig Dispense Refill  . aspirin 81 MG tablet Take 81 mg by mouth daily.        . metoprolol succinate (TOPROL XL) 50 MG 24 hr tablet Take 1 tablet (50 mg total) by mouth daily.  90 tablet  3  . ramipril (ALTACE) 10 MG capsule Take 1 capsule (10 mg total) by mouth daily.  90 capsule  3  . simvastatin (ZOCOR) 80 MG tablet Take 1 tablet (80 mg total) by mouth at bedtime.  90 tablet  3    ADDITIONAL HOME MEDICATIONS: See medication administration record  PHYSICIANS INVOLVED IN CARE: Guerry Bruin (primary care)  Past Surgical History  Procedure Date  . Knee surgery      arthroscopic left knee surgery  . Cardiac catheterization 03/17/2005    Ejection fraction is estimated at 55%    Family History  Problem Relation Age of Onset  . Heart disease Father      Social History:  reports that he has never smoked. He does not have any smokeless tobacco history on file. He reports that he does not drink alcohol or use illicit drugs.  Allergies: No Known Allergies   ROS: Heart disease and high blood pressure  PHYSICAL EXAM: Blood pressure 158/69, pulse 86, temperature 98.9 F (37.2 C), temperature source Oral, resp. rate 29, height 6\' 1"  (1.854 m), weight 99.791 kg (220 lb), SpO2 93.00%. In general, patient is an elderly white man who was sleepy but easily arousable while lying at 30 elevation head of bed. He was a good historian. HEENT exam was significant for mild rhinitis, neck was supple without jugular venous distention or carotid bruit, chest was clear to auscultation, heart had a regular rate and rhythm without significant murmur or gallop, abdomen had normal bowel sounds and no hepatosplenomegaly or tenderness, extremities were without cyanosis, clubbing, or edema and the pedal pulses were normal. He was alert and oriented x3, and able to move all extremities well. Cerebellar function and gait were not assessed.  Results for orders placed during the hospital encounter of 12/01/11 (from the past 48 hour(s))  CBC WITH DIFFERENTIAL     Status: Abnormal   Collection Time  12/01/11  5:38 PM      Component Value Range Comment   WBC 12.0 (*) 4.0 - 10.5 K/uL    RBC 5.44  4.22 - 5.81 MIL/uL    Hemoglobin 16.7  13.0 - 17.0 g/dL    HCT 16.1  09.6 - 04.5 %    MCV 85.3  78.0 - 100.0 fL    MCH 30.7  26.0 - 34.0 pg    MCHC 36.0  30.0 - 36.0 g/dL    RDW 40.9  81.1 - 91.4 %    Platelets 134 (*) 150 - 400 K/uL    Neutrophils Relative 82 (*) 43 - 77 %    Neutro Abs 9.9 (*) 1.7 - 7.7 K/uL    Lymphocytes Relative 12  12 - 46 %    Lymphs Abs 1.4  0.7 - 4.0 K/uL     Monocytes Relative 6  3 - 12 %    Monocytes Absolute 0.7  0.1 - 1.0 K/uL    Eosinophils Relative 0  0 - 5 %    Eosinophils Absolute 0.0  0.0 - 0.7 K/uL    Basophils Relative 0  0 - 1 %    Basophils Absolute 0.0  0.0 - 0.1 K/uL   BASIC METABOLIC PANEL     Status: Abnormal   Collection Time   12/01/11  5:38 PM      Component Value Range Comment   Sodium 135  135 - 145 mEq/L    Potassium 3.4 (*) 3.5 - 5.1 mEq/L    Chloride 98  96 - 112 mEq/L    CO2 21  19 - 32 mEq/L    Glucose, Bld 141 (*) 70 - 99 mg/dL    BUN 14  6 - 23 mg/dL    Creatinine, Ser 7.82  0.50 - 1.35 mg/dL    Calcium 9.0  8.4 - 95.6 mg/dL    GFR calc non Af Amer 72 (*) >90 mL/min    GFR calc Af Amer 84 (*) >90 mL/min   AMMONIA     Status: Abnormal   Collection Time   12/01/11 10:31 PM      Component Value Range Comment   Ammonia 10 (*) 11 - 60 umol/L   URINALYSIS, ROUTINE W REFLEX MICROSCOPIC     Status: Abnormal   Collection Time   12/02/11  1:14 AM      Component Value Range Comment   Color, Urine AMBER (*) YELLOW BIOCHEMICALS MAY BE AFFECTED BY COLOR   APPearance CLOUDY (*) CLEAR    Specific Gravity, Urine 1.035 (*) 1.005 - 1.030    pH 5.5  5.0 - 8.0    Glucose, UA NEGATIVE  NEGATIVE mg/dL    Hgb urine dipstick NEGATIVE  NEGATIVE    Bilirubin Urine MODERATE (*) NEGATIVE    Ketones, ur 40 (*) NEGATIVE mg/dL    Protein, ur 213 (*) NEGATIVE mg/dL    Urobilinogen, UA 1.0  0.0 - 1.0 mg/dL    Nitrite NEGATIVE  NEGATIVE    Leukocytes, UA NEGATIVE  NEGATIVE   URINE RAPID DRUG SCREEN (HOSP PERFORMED)     Status: Normal   Collection Time   12/02/11  1:14 AM      Component Value Range Comment   Opiates NONE DETECTED  NONE DETECTED    Cocaine NONE DETECTED  NONE DETECTED    Benzodiazepines NONE DETECTED  NONE DETECTED    Amphetamines NONE DETECTED  NONE DETECTED    Tetrahydrocannabinol NONE DETECTED  NONE  DETECTED    Barbiturates NONE DETECTED  NONE DETECTED   URINE MICROSCOPIC-ADD ON     Status: Abnormal    Collection Time   12/02/11  1:14 AM      Component Value Range Comment   Squamous Epithelial / LPF RARE  RARE    WBC, UA 0-2  <3 WBC/hpf    RBC / HPF 0-2  <3 RBC/hpf    Bacteria, UA FEW (*) RARE    Urine-Other MUCOUS PRESENT      Dg Chest 2 View  12/01/2011  *RADIOLOGY REPORT*  Clinical Data: Weakness.  Generalized body aches.  Fever.  CHEST - 2 VIEW  Comparison: Chest x-ray 03/19/2005.  Findings: Lung volumes are low.  No definite consolidative airspace disease.  No pleural effusions.  Pulmonary vasculature and the cardiomediastinal silhouette are within normal limits allowing for the low lung volumes and patient rotation to the left. Atherosclerosis in the thoracic aorta.  IMPRESSION: 1.  Low lung volumes without radiographic evidence of acute cardiopulmonary disease. 2.  Atherosclerosis.   Original Report Authenticated By: Florencia Reasons, M.D.    Ct Head Wo Contrast  12/01/2011  *RADIOLOGY REPORT*  Clinical Data: Weakness, fever, body aches  CT HEAD WITHOUT CONTRAST  Technique:  Contiguous axial images were obtained from the base of the skull through the vertex without contrast.  Comparison: None.  Findings: No evidence of parenchymal hemorrhage or extra-axial fluid collection. No mass lesion, mass effect, or midline shift.  No CT evidence of acute infarction.  Old right basal ganglia lacunar infarct.  Subcortical white matter and periventricular small vessel ischemic changes. Intracranial atherosclerosis.  Near complete opacification of the left maxillary sinus, likely chronic.  The mastoid air cells are unopacified.  No evidence of calvarial fracture.  IMPRESSION: No evidence of acute intracranial abnormality.  Near complete opacification of the left maxillary sinus, likely chronic.   Original Report Authenticated By: Charline Bills, M.D.      Assessment/Plan #1 Fever and Fatigue:  Unclear cause and likely due to acute left maxillary sinusitis versus a viral syndrome. We will treat him  with rocephin and afrin and check a rapid flu test. #2 Dehydration: mild and will be treated with continued IVF.  #3 CAD: stable on current meds, willl hold altace for now due to dehydration.   Tad Fancher G 12/02/2011, 7:41 AM

## 2011-12-02 NOTE — ED Notes (Signed)
Pt ambulated in hall with assistance 

## 2011-12-03 DIAGNOSIS — I10 Essential (primary) hypertension: Secondary | ICD-10-CM | POA: Diagnosis not present

## 2011-12-03 DIAGNOSIS — R509 Fever, unspecified: Secondary | ICD-10-CM | POA: Diagnosis not present

## 2011-12-03 DIAGNOSIS — E860 Dehydration: Secondary | ICD-10-CM | POA: Diagnosis not present

## 2011-12-03 DIAGNOSIS — M6281 Muscle weakness (generalized): Secondary | ICD-10-CM | POA: Diagnosis not present

## 2011-12-03 LAB — CBC
HCT: 41.1 % (ref 39.0–52.0)
Hemoglobin: 14.1 g/dL (ref 13.0–17.0)
RBC: 4.66 MIL/uL (ref 4.22–5.81)
WBC: 12.5 10*3/uL — ABNORMAL HIGH (ref 4.0–10.5)

## 2011-12-03 LAB — COMPREHENSIVE METABOLIC PANEL
BUN: 15 mg/dL (ref 6–23)
Calcium: 8.3 mg/dL — ABNORMAL LOW (ref 8.4–10.5)
GFR calc Af Amer: 85 mL/min — ABNORMAL LOW (ref >90)
Glucose, Bld: 105 mg/dL — ABNORMAL HIGH (ref 70–99)
Total Protein: 5.9 g/dL — ABNORMAL LOW (ref 6.0–8.3)

## 2011-12-03 NOTE — Progress Notes (Signed)
Subjective: He is feeling better today with less weakness and his appetite is improving. He denies headache, nausea, or abdominal pain Objective: Vital signs in last 24 hours: Temp:  [97.7 F (36.5 C)-100.5 F (38.1 C)] 97.7 F (36.5 C) (09/22 0603) Pulse Rate:  [82-85] 85  (09/22 0603) Resp:  [20-21] 20  (09/22 0603) BP: (127-180)/(70-81) 149/79 mmHg (09/22 0603) SpO2:  [91 %-93 %] 92 % (09/22 0603) Weight change:    Intake/Output from previous day: 09/21 0701 - 09/22 0700 In: 1080 [P.O.:480; I.V.:600] Out: -    General appearance: alert, cooperative and no distress Resp: clear to auscultation bilaterally Cardio: regular rate and rhythm, S1, S2 normal, no murmur, click, rub or gallop GI: soft, non-tender; bowel sounds normal; no masses,  no organomegaly Neurologic: Grossly normal  Lab Results:  Basename 12/03/11 0540 12/02/11 0751  WBC 12.5* 12.4*  HGB 14.1 15.2  HCT 41.1 42.6  PLT 105* 110*   BMET  Basename 12/03/11 0540 12/02/11 0751 12/01/11 1738  NA 136 -- 135  K 3.6 -- 3.4*  CL 102 -- 98  CO2 24 -- 21  GLUCOSE 105* -- 141*  BUN 15 -- 14  CREATININE 1.00 1.02 --  CALCIUM 8.3* -- 9.0   CMET CMP     Component Value Date/Time   NA 136 12/03/2011 0540   K 3.6 12/03/2011 0540   CL 102 12/03/2011 0540   CO2 24 12/03/2011 0540   GLUCOSE 105* 12/03/2011 0540   BUN 15 12/03/2011 0540   CREATININE 1.00 12/03/2011 0540   CALCIUM 8.3* 12/03/2011 0540   PROT 5.9* 12/03/2011 0540   ALBUMIN 3.2* 12/03/2011 0540   AST 29 12/03/2011 0540   ALT 43 12/03/2011 0540   ALKPHOS 52 12/03/2011 0540   BILITOT 1.6* 12/03/2011 0540   GFRNONAA 73* 12/03/2011 0540   GFRAA 85* 12/03/2011 0540    CBG (last 3)  No results found for this basename: GLUCAP:3 in the last 72 hours  INR RESULTS:   No results found for this basename: INR, PROTIME     Studies/Results: Dg Chest 2 View  12/01/2011  *RADIOLOGY REPORT*  Clinical Data: Weakness.  Generalized body aches.  Fever.  CHEST - 2 VIEW   Comparison: Chest x-ray 03/19/2005.  Findings: Lung volumes are low.  No definite consolidative airspace disease.  No pleural effusions.  Pulmonary vasculature and the cardiomediastinal silhouette are within normal limits allowing for the low lung volumes and patient rotation to the left. Atherosclerosis in the thoracic aorta.  IMPRESSION: 1.  Low lung volumes without radiographic evidence of acute cardiopulmonary disease. 2.  Atherosclerosis.   Original Report Authenticated By: Florencia Reasons, M.D.    Ct Head Wo Contrast  12/01/2011  *RADIOLOGY REPORT*  Clinical Data: Weakness, fever, body aches  CT HEAD WITHOUT CONTRAST  Technique:  Contiguous axial images were obtained from the base of the skull through the vertex without contrast.  Comparison: None.  Findings: No evidence of parenchymal hemorrhage or extra-axial fluid collection. No mass lesion, mass effect, or midline shift.  No CT evidence of acute infarction.  Old right basal ganglia lacunar infarct.  Subcortical white matter and periventricular small vessel ischemic changes. Intracranial atherosclerosis.  Near complete opacification of the left maxillary sinus, likely chronic.  The mastoid air cells are unopacified.  No evidence of calvarial fracture.  IMPRESSION: No evidence of acute intracranial abnormality.  Near complete opacification of the left maxillary sinus, likely chronic.   Original Report Authenticated By: Charline Bills, M.D.  Medications: I have reviewed the patient's current medications.  Assessment/Plan: #1 Weakness:  From acute sinusitis and improving with IV antibiotics. We will have PT evaluation today and if ambulation has improved then he may be able to be discharged in 24-48 hours #2 Acute Sinusitis: improved with rocephin #3 Thrombocytopenia: mild and of uncertain cause and we will recheck tomorrow #4 CAD: stable with no angina sxs  LOS: 2 days   Sayre Mazor G 12/03/2011, 12:30 PM

## 2011-12-04 DIAGNOSIS — R509 Fever, unspecified: Secondary | ICD-10-CM | POA: Diagnosis not present

## 2011-12-04 DIAGNOSIS — M6281 Muscle weakness (generalized): Secondary | ICD-10-CM | POA: Diagnosis not present

## 2011-12-04 DIAGNOSIS — I10 Essential (primary) hypertension: Secondary | ICD-10-CM | POA: Diagnosis not present

## 2011-12-04 DIAGNOSIS — E860 Dehydration: Secondary | ICD-10-CM | POA: Diagnosis not present

## 2011-12-04 LAB — CBC
Hemoglobin: 13.5 g/dL (ref 13.0–17.0)
MCH: 30 pg (ref 26.0–34.0)
MCV: 86.9 fL (ref 78.0–100.0)
RBC: 4.5 MIL/uL (ref 4.22–5.81)

## 2011-12-04 MED ORDER — AMOXICILLIN-POT CLAVULANATE 875-125 MG PO TABS
1.0000 | ORAL_TABLET | Freq: Two times a day (BID) | ORAL | Status: DC
  Administered 2011-12-04 – 2011-12-07 (×7): 1 via ORAL
  Filled 2011-12-04 (×8): qty 1

## 2011-12-04 NOTE — Progress Notes (Signed)
Utilization review completed.  

## 2011-12-04 NOTE — Progress Notes (Signed)
Subjective: Ate a small amount of breakfast this AM.  In bed all day yesterday as his PT was ordered after noon.  Feels ok, but a bit tired this morning.  Objective: Vital signs in last 24 hours: Temp:  [98.5 F (36.9 C)-99.3 F (37.4 C)] 98.5 F (36.9 C) (09/23 0500) Pulse Rate:  [68-78] 68  (09/23 0500) Resp:  [20-21] 21  (09/23 0500) BP: (150-161)/(75-88) 150/82 mmHg (09/23 0500) SpO2:  [94 %-95 %] 95 % (09/23 0500) Weight change:  Last BM Date: 11/29/11  Intake/Output from previous day: 09/22 0701 - 09/23 0700 In: 1160 [P.O.:960; I.V.:200] Out: -  Intake/Output this shift:  General appearance: alert, cooperative and no distress  Resp: clear to auscultation bilaterally  Cardio: regular rate and rhythm, S1, S2 normal, no murmur, click, rub or gallop  GI: soft, non-tender; bowel sounds normal; no masses, no organomegaly  Neurologic: Grossly normal, no focal deficits, memory ok, but a bit less spry than his usual.   Lab Results:  Basename 12/04/11 0458 12/03/11 0540  WBC 8.3 12.5*  HGB 13.5 14.1  HCT 39.1 41.1  PLT 92* 105*   BMET  Basename 12/03/11 0540 12/02/11 0751 12/01/11 1738  NA 136 -- 135  K 3.6 -- 3.4*  CL 102 -- 98  CO2 24 -- 21  GLUCOSE 105* -- 141*  BUN 15 -- 14  CREATININE 1.00 1.02 --  CALCIUM 8.3* -- 9.0    Studies/Results: No results found.  Medications:  I have reviewed the patient's current medications. Scheduled:   . amoxicillin-clavulanate  1 tablet Oral Q12H  . aspirin EC  81 mg Oral Daily  . atorvastatin  40 mg Oral q1800  . metoprolol succinate  50 mg Oral Daily  . oxymetazoline  1 spray Each Nare BID  . sodium chloride  3 mL Intravenous Q12H  . DISCONTD: cefTRIAXone (ROCEPHIN)  IV  1 g Intravenous Q24H  . DISCONTD: enoxaparin (LOVENOX) injection  40 mg Subcutaneous Q24H   Continuous:   . 0.9 % NaCl with KCl 20 mEq / L 50 mL/hr at 12/03/11 1235   ZOX:[id number], acetaminophen, alum & mag hydroxide-simeth, promethazine,  zolpidem  Assessment/Plan: 1) Sinusitis.  Will change to PO Augmentin today to make sure that he tolerates it. 2) Weakness.  Will be seen by PT today and assessed.  May need SNF time if is not able to function at home by himself.  Will also get social work to help arrange home health monitoring. 3) CAD.  Continue current meds, no sign of recurrence. 4) Thrombocytopenia- As is a bit lower this AM, will d/c his Lovenox and change to SCD's.  No signs of blood loss.  Dispo: Depends on how he does with PT, home vs SNF short term.  LOS: 3 days   Idabell Picking W 12/04/2011, 8:00 AM

## 2011-12-04 NOTE — Care Management Note (Signed)
    Page 1 of 2   12/07/2011     1:27:12 PM   CARE MANAGEMENT NOTE 12/07/2011  Patient:  [name], [name]   Account Number:  [id number]  Date Initiated:  12/04/2011  Documentation initiated by:  Donn Pierini  Subjective/Objective Assessment:   Pt admitted with weakness/ Sinusitis     Action/Plan:   PTA pt lived at home with sister, PT eval   Anticipated DC Date:  12/07/2011   Anticipated DC Plan:  SKILLED NURSING FACILITY  In-house referral  Clinical Social Worker      DC Planning Services  CM consult      Choice offered to / List presented to:             [facility] agency  [facility]   Status of service:  Completed, signed off Medicare Important Message given?  YES (If response is "NO", the following Medicare IM given date fields will be blank) Date Medicare IM given:  12/01/2011 Date Additional Medicare IM given:    Discharge Disposition:  SKILLED NURSING FACILITY  Per UR Regulation:  Reviewed for med. necessity/level of care/duration of stay  If discussed at Long Length of Stay Meetings, dates discussed:   12/07/2011    Comments:  PCP- Guerry Bruin W  12/06/11- 1400- Donn Pierini RN, BSN [phone] Pt more confused plan now is for SNF, CSW consulted for placement needs.  12/04/11- 1400- Donn Pierini RN, BSN [phone] Spoke with pt at bedside regarding PT recommendations for HH-PT- per conversation pt states that he lives at home with sister (who still drives) and that he does not use any DME at home. Pt agreeable to [facility] if needed and would possibly like a RW for home (wants to see how he does with therapy). List of HH agencies for [employer] given to pt- per pt choice he would like to use [facility] for any HH and DME needs.  Per pt he uses mail order for his prescription needs and will have transportation home. NCM to follow for [facility] orders and d/c planning

## 2011-12-04 NOTE — Evaluation (Signed)
Physical Therapy Evaluation Patient Details Name: [name] MRN: [id number] DOB: 1940/08/30 Today's Date: 12/04/2011 Time: 4098-1191 PT Time Calculation (min): 34 min  PT Assessment / Plan / Recommendation Clinical Impression  Pt. was admitted with delirium and dehydration, weakness from acute sinusitis.  Presents to PT with decrease in his usual state of independence due to generalized weakness and deconditioning from acute illness.  will benefit from acute PT to address these issues for return home at time of DC.      PT Assessment  Patient needs continued PT services    Follow Up Recommendations  Home health PT;Supervision/Assistance - 24 hour;Other (comment) (initial 24 hour assist)    Barriers to Discharge None Pt. lives with sister who he says drives and can assist with anything he may need.    Equipment Recommendations  Rolling walker with 5" wheels;Other (comment) (possible need for RW, further TBD)    Recommendations for Other Services OT consult   Frequency Min 3X/week    Precautions / Restrictions Precautions Precautions: Fall Restrictions Weight Bearing Restrictions: No   Pertinent Vitals/Pain Npo pain, O2 sats stable on room air with walking      Mobility  Bed Mobility Bed Mobility: Supine to Sit;Sit to Supine Supine to Sit: 6: Modified independent (Device/Increase time);With rails;HOB elevated Sit to Supine: Not Tested (comment) Details for Bed Mobility Assistance: Pt. managed transition well Transfers Transfers: Sit to Stand;Stand to Sit Sit to Stand: 4: Min guard;From bed;With upper extremity assist Stand to Sit: 4: Min guard;With upper extremity assist;To chair/3-in-1 Details for Transfer Assistance: Need for slight increased time to complete, good technique Ambulation/Gait Ambulation/Gait Assistance: 4: Min guard Ambulation Distance (Feet): 200 Feet Assistive device: None Ambulation/Gait Assistance Details: Pt. ambluates with slight forward  inclination of his trunk which decreases his stability minimally.  Good tolerance for longer distance of around nursing unit, though fatigues more quickly than his usual baseline. Gait Pattern: Step-through pattern;Trunk flexed Stairs: No    Exercises General Exercises - Lower Extremity Ankle Circles/Pumps: Both;AROM;15 reps;Seated Long Arc Quad: AROM;Both;15 reps;Seated Hip ABduction/ADduction: AROM;15 reps;Both;Seated Hip Flexion/Marching: AROM;Both;15 reps;Seated   PT Diagnosis: Generalized weakness;Difficulty walking  PT Problem List: Decreased strength;Decreased activity tolerance;Decreased balance;Decreased mobility;Decreased knowledge of use of DME PT Treatment Interventions: DME instruction;Gait training;Stair training;Functional mobility training;Therapeutic activities;Therapeutic exercise;Balance training;Patient/family education   PT Goals Acute Rehab PT Goals PT Goal Formulation: With patient Time For Goal Achievement: 12/11/11 Potential to Achieve Goals: Good Pt will go Supine/Side to Sit: Independently PT Goal: Supine/Side to Sit - Progress: Goal set today Pt will go Sit to Supine/Side: Independently PT Goal: Sit to Supine/Side - Progress: Goal set today Pt will go Sit to Stand: with modified independence PT Goal: Sit to Stand - Progress: Goal set today Pt will go Stand to Sit: with modified independence PT Goal: Stand to Sit - Progress: Goal set today Pt will Ambulate: >150 feet;with modified independence;with least restrictive assistive device PT Goal: Ambulate - Progress: Goal set today Pt will Go Up / Down Stairs: 3-5 stairs;with rail(s);with min assist PT Goal: Up/Down Stairs - Progress: Goal set today  Visit Information  Last PT Received On: 12/04/11 Assistance Needed: +1    Subjective Data  Subjective: "I feel weak" Patient Stated Goal: Hopes to return home and get stronger   Prior Functioning  Home Living Lives With: Family;Other (Comment)  (sister) Available Help at Discharge: Family;Available 24 hours/day Type of Home: House Home Access: Stairs to enter [employer] of Steps: 3 Entrance Stairs-Rails: Can  reach both;Right;Left Home Layout: One level Bathroom Shower/Tub: Tub/shower unit;Curtain Firefighter: Standard Home Adaptive Equipment: Walker - standard;Bedside commode/3-in-1;Straight cane Prior Function Level of Independence: Independent Able to Take Stairs?: Yes Driving: Yes Vocation: Retired Musician: No difficulties Dominant Hand: Right    Cognition  Overall Cognitive Status: Appears within functional limits for tasks assessed/performed Arousal/Alertness: Awake/alert Orientation Level: Oriented X4 / Intact Behavior During Session: WFL for tasks performed    Extremity/Trunk Assessment Right Upper Extremity Assessment RUE ROM/Strength/Tone: [facility] for tasks assessed Left Upper Extremity Assessment LUE ROM/Strength/Tone: WFL for tasks assessed Right Lower Extremity Assessment RLE ROM/Strength/Tone: Within functional levels RLE Sensation: WFL - Light Touch RLE Coordination: WFL - gross/fine motor Left Lower Extremity Assessment LLE ROM/Strength/Tone: Within functional levels LLE Sensation: WFL - Light Touch LLE Coordination: WFL - gross/fine motor Trunk Assessment Trunk Assessment: Normal   Balance Balance Balance Assessed: Yes Static Standing Balance Static Standing - Balance Support: No upper extremity supported;During functional activity Static Standing - Level of Assistance: 5: Stand by assistance Single Leg Stance - Right Leg: 3  Single Leg Stance - Left Leg: 2   End of Session PT - End of Session Equipment Utilized During Treatment: Gait belt Activity Tolerance: Patient limited by fatigue Patient left: in chair;with call bell/phone within reach;with chair alarm set Nurse Communication: Mobility status  GP     [name] 12/04/2011, 10:08 AM Weldon Picking PT Acute Rehab Services [phone] Bee[place] [phone]

## 2011-12-04 NOTE — Progress Notes (Signed)
CSW received referral for potential disposition needs. Per chart review, and physical therapy evaluation, pt is recommended to return home with home health services and initial 24 hour assistance. CSW informed RN case Production designer, theatre/television/film. Please re-consult csw if further csw needs arise. Marland KitchenNo further Clinical Social Work needs, signing off.   Catha Gosselin, Theresia Majors  [phone] .12/04/2011 11:09am

## 2011-12-05 ENCOUNTER — Encounter (HOSPITAL_COMMUNITY): Payer: Self-pay | Admitting: General Practice

## 2011-12-05 DIAGNOSIS — M6281 Muscle weakness (generalized): Secondary | ICD-10-CM | POA: Diagnosis not present

## 2011-12-05 DIAGNOSIS — E860 Dehydration: Secondary | ICD-10-CM | POA: Diagnosis not present

## 2011-12-05 DIAGNOSIS — R404 Transient alteration of awareness: Secondary | ICD-10-CM | POA: Diagnosis not present

## 2011-12-05 DIAGNOSIS — R509 Fever, unspecified: Secondary | ICD-10-CM | POA: Diagnosis not present

## 2011-12-05 DIAGNOSIS — I10 Essential (primary) hypertension: Secondary | ICD-10-CM | POA: Diagnosis not present

## 2011-12-05 LAB — BASIC METABOLIC PANEL
BUN: 8 mg/dL (ref 6–23)
Calcium: 8.4 mg/dL (ref 8.4–10.5)
Chloride: 105 mEq/L (ref 96–112)
Creatinine, Ser: 0.85 mg/dL (ref 0.50–1.35)
GFR calc Af Amer: >90 mL/min (ref >90)
GFR calc non Af Amer: 85 mL/min — ABNORMAL LOW (ref >90)

## 2011-12-05 LAB — CBC
HCT: 39.4 % (ref 39.0–52.0)
MCHC: 34.8 g/dL (ref 30.0–36.0)
Platelets: 110 10*3/uL — ABNORMAL LOW (ref 150–400)
RDW: 12.9 % (ref 11.5–15.5)
WBC: 7.5 10*3/uL (ref 4.0–10.5)

## 2011-12-05 LAB — TROPONIN I: Troponin I: <0.3 ng/mL (ref <0.30)

## 2011-12-05 LAB — CK TOTAL AND CKMB (NOT AT ARMC): Relative Index: INVALID (ref 0.0–2.5)

## 2011-12-05 LAB — VITAMIN B12: Vitamin B-12: 202 pg/mL — ABNORMAL LOW (ref 211–911)

## 2011-12-05 LAB — TSH: TSH: 0.993 u[IU]/mL (ref 0.350–4.500)

## 2011-12-05 MED ORDER — INFLUENZA VIRUS VACC SPLIT PF IM SUSP
0.5000 mL | INTRAMUSCULAR | Status: AC
  Administered 2011-12-06: 0.5 mL via INTRAMUSCULAR
  Filled 2011-12-05: qty 0.5

## 2011-12-05 MED ORDER — CYANOCOBALAMIN 1000 MCG/ML IJ SOLN
1000.0000 ug | Freq: Once | INTRAMUSCULAR | Status: AC
  Administered 2011-12-05: 1000 ug via INTRAMUSCULAR
  Filled 2011-12-05: qty 1

## 2011-12-05 NOTE — Progress Notes (Signed)
Physical Therapy Treatment Patient Details Name: [name] MRN: [id number] DOB: 1940/11/27 Today's Date: 12/05/2011 Time: 4132-4401 PT Time Calculation (min): 39 min  PT Assessment / Plan / Recommendation Comments on Treatment Session  pt presents with Delirium.  pt with decline in status today, requiring A for all aspects of mobility and demos tremors throughout session.  pt with flat affect, slow to process, festinating gait, flexed posture, freezes with turns.  pt to go to MRI today.  Feel at present, pt not safe to D/C to home with sister with disabilities, pt will need SNF at D/C.      Follow Up Recommendations  Skilled nursing facility    Barriers to Discharge        Equipment Recommendations  Rolling walker with 5" wheels    Recommendations for Other Services OT consult  Frequency Min 3X/week   Plan Discharge plan needs to be updated;Frequency remains appropriate    Precautions / Restrictions Precautions Precautions: Fall Restrictions Weight Bearing Restrictions: No   Pertinent Vitals/Pain Denies pain.      Mobility  Bed Mobility Bed Mobility: Supine to Sit;Sitting - Scoot to Edge of Bed Supine to Sit: 4: Min assist;With rails;HOB elevated Sitting - Scoot to Edge of Bed: 5: Supervision Details for Bed Mobility Assistance: pt moves slowly and needed increased time for initiation.   Transfers Transfers: Sit to Stand;Stand to Sit Sit to Stand: 4: Min assist;With upper extremity assist;From bed;From chair/3-in-1;With armrests Stand to Sit: 4: Min guard;With upper extremity assist;To chair/3-in-1 Details for Transfer Assistance: cues for UE use, anterior wt shift with sit to stand.  pt needs increased time to process and initiate movement.  Mildly unsteady.   Ambulation/Gait Ambulation/Gait Assistance: 4: Min guard;4: Min Environmental consultant (Feet): 200 Feet Assistive device: Rolling walker Ambulation/Gait Assistance Details: pt unsteady and requires  MinGuard- MinA to maintain balance.  pt with difficulty negotiating obstacles with RW.  pt with full body tremors throughout mobility and demos festinating gait with very short strides and increased number of steps needed to complete turns.   Gait Pattern: Step-through pattern;Decreased stride length Gait velocity: slow Stairs: No Wheelchair Mobility Wheelchair Mobility: No    Exercises     PT Diagnosis:    PT Problem List:   PT Treatment Interventions:     PT Goals Acute Rehab PT Goals Time For Goal Achievement: 12/11/11 Potential to Achieve Goals: Good PT Goal: Supine/Side to Sit - Progress: Progressing toward goal PT Goal: Sit to Supine/Side - Progress: Progressing toward goal PT Goal: Sit to Stand - Progress: Progressing toward goal PT Goal: Stand to Sit - Progress: Progressing toward goal PT Goal: Ambulate - Progress: Progressing toward goal  Visit Information  Last PT Received On: 12/05/11 Assistance Needed: +1    Subjective Data  Subjective: I feel worse today.     Cognition  Overall Cognitive Status: Impaired Area of Impairment: Attention;Memory;Problem solving Arousal/Alertness: Awake/alert Orientation Level: Oriented X4 / Intact Behavior During Session: WFL for tasks performed Current Attention Level: Selective Memory Deficits: pt with decreased recall of events of the last few days.   Problem Solving: pt seems slow to process and needs increased time to answer questions.      Balance  Balance Balance Assessed: Yes Static Standing Balance Static Standing - Balance Support: Bilateral upper extremity supported Static Standing - Level of Assistance: 5: Stand by assistance  End of Session PT - End of Session Equipment Utilized During Treatment: Gait belt Activity Tolerance: Patient limited by  fatigue Patient left: in chair;with call bell/phone within reach;with chair alarm set;with family/visitor present Nurse Communication: Mobility status   GP      Sunny Schlein, [place] [phone] 12/05/2011, 12:23 PM

## 2011-12-05 NOTE — Progress Notes (Signed)
Subjective: He is clearly not back to baseline.  Had difficulty getting from bathroom to bed this AM, needed help of nurse and PT.  His mind is not clear either.  He is oriented, but slower than normal.  Denies CP/SOB, but felt "weak" this AM.  Objective: Vital signs in last 24 hours: Temp:  [97.4 F (36.3 C)-98.6 F (37 C)] 97.8 F (36.6 C) (09/24 0500) Pulse Rate:  [72-84] 72  (09/24 0500) Resp:  [18-22] 18  (09/24 0500) BP: (135-163)/(79-96) 135/79 mmHg (09/24 0500) SpO2:  [94 %-96 %] 95 % (09/24 0500) Weight change:  Last BM Date: 12/04/11  Intake/Output from previous day: 09/23 0701 - 09/24 0700 In: 1200 [P.O.:600; I.V.:600] Out: -  Intake/Output this shift:   General appearance: alert, cooperative and no distress  Resp: clear to auscultation bilaterally  Cardio: regular rate and rhythm, S1, S2 normal, no murmur, click, rub or gallop  GI: soft, non-tender; bowel sounds normal; no masses, no organomegaly  Neurologic: Grossly normal, no focal deficits, memory ok but processes still slow.  Lab Results:  Basename 12/05/11 0421 12/04/11 0458  WBC 7.5 8.3  HGB 13.7 13.5  HCT 39.4 39.1  PLT 110* 92*   BMET  Basename 12/05/11 0421 12/03/11 0540  NA 140 136  K 3.5 3.6  CL 105 102  CO2 25 24  GLUCOSE 109* 105*  BUN 8 15  CREATININE 0.85 1.00  CALCIUM 8.4 8.3*    Studies/Results: No results found.  Medications:  I have reviewed the patient's current medications. Scheduled:   . amoxicillin-clavulanate  1 tablet Oral Q12H  . aspirin EC  81 mg Oral Daily  . atorvastatin  40 mg Oral q1800  . cyanocobalamin  1,000 mcg Intramuscular Once  . metoprolol succinate  50 mg Oral Daily  . oxymetazoline  1 spray Each Nare BID  . sodium chloride  3 mL Intravenous Q12H   Continuous:   . 0.9 % NaCl with KCl 20 mEq / L 50 mL/hr at 12/04/11 1800   ZOX:[id number], acetaminophen, alum & mag hydroxide-simeth, promethazine, zolpidem  Assessment/Plan: 1) Sinusitis. PO  Augmentin being tolerated well. 2) Weakness. Has been seen by PT, but Soc wk thinks he can return home.  I spoke with his sister [name], who is usually in charge of her family's medical issues.  She is not the sister that lives with him.  I agree with her that he is clearly not in a position to return home and SNF needs to be looked into as an endpoint for him.  I reconsulted Soc Wk after our discussion. 3) CAD. Continue current meds, no sign of recurrence, but will check EKG and enzymes given this AM's episode.  4) Thrombocytopenia- Better with the holding of Lovenox, continue to follow. 5) Vit B12 deificiency.  Order for IM today as was in low 200 range. 6) MRI Brain ordered yesterday to eval for poss CVA.  Will have Neuro consult as well.  I suspect that he may have had an event that did not show up at admission despite his negative CT per ER.  B12 as above, TSH ok.  No abnormalities on tele at this point. 7) Dispo- See above, clearly cannot return home as cannot get from bathroom to bed without 2 people helping him.  Soc wk to put FL-2 on chart and call his sister Darcie.  LOS: 4 days   [name],[name] 12/05/2011, 8:03 AM

## 2011-12-05 NOTE — Clinical Social Work Psychosocial (Signed)
     Clinical Social Work Department BRIEF PSYCHOSOCIAL ASSESSMENT 12/05/2011  Patient:  [name], [name]     Account Number:  [id number]     Admit date:  12/01/2011  Clinical Social Worker:  Margaree Mackintosh  Date/Time:  12/05/2011 11:39 AM  Referred by:  Physician  Date Referred:  12/05/2011 Referred for  SNF Placement   Other Referral:   Interview type:  Patient Other interview type:   with sister-[name]-present.    PSYCHOSOCIAL DATA Living Status:  ALONE Admitted from facility:   Level of care:   Primary support name:  [name] Primary support relationship to patient:   Degree of support available:   Adequate.    CURRENT CONCERNS Current Concerns  Post-Acute Placement   Other Concerns:    SOCIAL WORK ASSESSMENT / PLAN Clinical Social Worker recieved referral indicating pt would benefit from post acute rehab to assist with improving pt's independence and mobility. CSW reviewed chart and met with pt.  CSW introduced self, explained role, and provided support. CSW reviewed PT recommendations, pt agreeable.  CSW reviewed SNF process. CSW to begin process and assist as needed.   Assessment/plan status:  Information/Referral to [facility] Other assessment/ plan:   Information/referral to community resources:   SNF    PATIENTS/FAMILYS RESPONSE TO PLAN OF CARE: Pt appeared tired and engaged in conversation.  Pt thanked CSW for intervention.

## 2011-12-06 ENCOUNTER — Inpatient Hospital Stay (HOSPITAL_COMMUNITY): Payer: Medicare Other

## 2011-12-06 DIAGNOSIS — R404 Transient alteration of awareness: Secondary | ICD-10-CM | POA: Diagnosis not present

## 2011-12-06 DIAGNOSIS — M6281 Muscle weakness (generalized): Secondary | ICD-10-CM | POA: Diagnosis not present

## 2011-12-06 DIAGNOSIS — I10 Essential (primary) hypertension: Secondary | ICD-10-CM | POA: Diagnosis not present

## 2011-12-06 DIAGNOSIS — E860 Dehydration: Secondary | ICD-10-CM | POA: Diagnosis not present

## 2011-12-06 DIAGNOSIS — R4182 Altered mental status, unspecified: Secondary | ICD-10-CM | POA: Diagnosis not present

## 2011-12-06 DIAGNOSIS — R509 Fever, unspecified: Secondary | ICD-10-CM | POA: Diagnosis not present

## 2011-12-06 LAB — COMPREHENSIVE METABOLIC PANEL
Alkaline Phosphatase: 58 U/L (ref 39–117)
BUN: 6 mg/dL (ref 6–23)
Chloride: 103 mEq/L (ref 96–112)
Creatinine, Ser: 0.88 mg/dL (ref 0.50–1.35)
GFR calc Af Amer: >90 mL/min (ref >90)
Glucose, Bld: 115 mg/dL — ABNORMAL HIGH (ref 70–99)
Potassium: 3.6 mEq/L (ref 3.5–5.1)
Total Bilirubin: 1.4 mg/dL — ABNORMAL HIGH (ref 0.3–1.2)
Total Protein: 5.8 g/dL — ABNORMAL LOW (ref 6.0–8.3)

## 2011-12-06 LAB — CBC
HCT: 39.4 % (ref 39.0–52.0)
Hemoglobin: 13.7 g/dL (ref 13.0–17.0)
MCHC: 34.8 g/dL (ref 30.0–36.0)
MCV: 86.4 fL (ref 78.0–100.0)

## 2011-12-06 MED ORDER — CARBIDOPA-LEVODOPA 25-100 MG PO TABS
1.0000 | ORAL_TABLET | Freq: Three times a day (TID) | ORAL | Status: DC
  Administered 2011-12-06 – 2011-12-07 (×2): 1 via ORAL
  Filled 2011-12-06 (×5): qty 1

## 2011-12-06 MED ORDER — RAMIPRIL 10 MG PO CAPS
10.0000 mg | ORAL_CAPSULE | Freq: Every day | ORAL | Status: DC
  Administered 2011-12-06 – 2011-12-07 (×2): 10 mg via ORAL
  Filled 2011-12-06 (×2): qty 1

## 2011-12-06 NOTE — Progress Notes (Signed)
Pt discharge delayed. CSW informed skilled nursing facility. Pt paper work is completed at [facility]. .Clinical social worker continuing to follow pt to assist with pt dc plans and further csw needs.   Catha Gosselin, Theresia Majors  [phone] .12/06/2011 1525pm

## 2011-12-06 NOTE — Progress Notes (Signed)
Subjective: Just came back from MRI.  I was told that there was machine downtime this weekend and they are a day behind at [facility].  He is still a bit shaky and nervous appearing and slower than he has been as an outpatient, where he was independent and fully functional.  Received B12 shot yesteday.  Objective: Vital signs in last 24 hours: Temp:  [97.4 F (36.3 C)-98.8 F (37.1 C)] 97.9 F (36.6 C) (09/25 0755) Pulse Rate:  [67-85] 85  (09/25 0755) Resp:  [18-21] 21  (09/25 0755) BP: (156-177)/(83-107) 163/89 mmHg (09/25 0755) SpO2:  [93 %-94 %] 94 % (09/25 0755) Weight change:  Last BM Date: 12/05/11  Intake/Output from previous day:   Intake/Output this shift:   General appearance: alert, cooperative and no distress  Resp: clear to auscultation bilaterally  Cardio: regular rate and rhythm, S1, S2 normal, no murmur, click, rub or gallop  GI: soft, non-tender; bowel sounds normal; no masses, no organomegaly  Neurologic: Grossly normal, no focal deficits, memory ok but processes still slow.  Unchanged from prior day   Lab Results:  [facility] 12/06/11 0517 12/05/11 0421  WBC 9.3 7.5  HGB 13.7 13.7  HCT 39.4 39.4  PLT 136* 110*   BMET  Basename 12/06/11 0517 12/05/11 0421  NA 139 140  K 3.6 3.5  CL 103 105  CO2 25 25  GLUCOSE 115* 109*  BUN 6 8  CREATININE 0.88 0.85  CALCIUM 8.4 8.4    Studies/Results: Mr [name] Contrast  12/06/2011  *RADIOLOGY REPORT*  Clinical Data: Altered mental status.  MRI HEAD WITHOUT AND WITH CONTRAST  Technique:  Multiplanar, multiecho pulse sequences of the brain and surrounding structures were obtained according to standard protocol without and with intravenous contrast  Contrast:  20 ml MultiHance.  Comparison: 12/01/2011 head CT.  No comparison MR.  Findings: No acute infarct.  No intracranial hemorrhage.  Very mild small vessel disease type changes.  Mild age related atrophy without hydrocephalus.  No intracranial mass or abnormal  enhancement.  Incidentally noted are prominent perivascular spaces within the basal ganglia.  Major intracranial vascular structures are patent.  Complete opacification left maxillary sinus.  Minimal to mild mucosal thickening ethmoid sinus air cells with minimal mucosal thickening frontal sinuses.  Partially empty sella.  Question mild spinal stenosis C2 level.  IMPRESSION: No acute infarct or intracranial mass.  Complete opacification left maxillary sinus.  Please see above.   Original Report Authenticated By: Fuller Canada, M.D.     Medications:  I have reviewed the patient's current medications. Scheduled:   . amoxicillin-clavulanate  1 tablet Oral Q12H  . aspirin EC  81 mg Oral Daily  . atorvastatin  40 mg Oral q1800  . cyanocobalamin  1,000 mcg Intramuscular Once  . influenza  inactive virus vaccine  0.5 mL Intramuscular Tomorrow-1000  . metoprolol succinate  50 mg Oral Daily  . oxymetazoline  1 spray Each Nare BID  . ramipril  10 mg Oral Daily  . sodium chloride  3 mL Intravenous Q12H   Continuous:   . 0.9 % NaCl with KCl 20 mEq / L 50 mL/hr at 12/06/11 7829   FAO:[id number], acetaminophen, alum & mag hydroxide-simeth, promethazine, zolpidem  Assessment/Plan: 1) Sinusitis. PO Augmentin being tolerated well.  2) Weakness. Has been seen by PT, He clearly is not at his baseline. Will get formal Neuro consult now that MRI completed.  Radiology read yet to be posted at this time. 3)Low platelets  holding of Lovenox, continue to follow.  4)Vit B12 deificiency. Order for IM today as was in low 200 range.  5)MRI Brain completed, overread pending 6) Will resume his Ramipril as he is hydrated, normal renal function and BP high Dispo- FL-2 signed.  If Neuro does not have a clear idea, will need rehab and reevaluation.   LOS: 5 days   Kura Bethards W 12/06/2011, 8:03 AM

## 2011-12-06 NOTE — Clinical Social Work Placement (Signed)
     Clinical Social Work Department CLINICAL SOCIAL WORK PLACEMENT NOTE 12/06/2011  Patient:  BASEL, REHKOP  Account Number:  [id number] Admit date:  12/01/2011  Clinical Social Worker:  Doree Albee  Date/time:  12/06/2011 11:55 AM  Clinical Social Work is seeking post-discharge placement for this patient at the following level of care:   SKILLED NURSING   (*CSW will update this form in Epic as items are completed)   12/05/2011  Patient/family provided with [facility] Department of Clinical Social Works list of facilities offering this level of care within the geographic area requested by the patient (or if unable, by the patients family).  12/05/2011  Patient/family informed of their freedom to choose among providers that offer the needed level of care, that participate in Medicare, Medicaid or managed care program needed by the patient, have an available bed and are willing to accept the patient.  12/05/2011  Patient/family informed of [facility] ownership interest in [facility], as well as of the fact that they are under no obligation to receive care at this facility.  PASARR submitted to EDS on 12/05/2011 PASARR number received from EDS on 12/05/2011  FL2 transmitted to all facilities in geographic area requested by pt/family on  12/05/2011 FL2 transmitted to all facilities within larger geographic area on   Patient informed that his/her managed care company has contracts with or will negotiate with  certain facilities, including the following:     Patient/family informed of bed offers received:  12/06/2011 Patient chooses bed at [facility] Physician recommends and patient chooses bed at    Patient to be transferred to  on   Patient to be transferred to facility by   The following physician request were entered in Epic:   Additional Comments: 12/06/2011 [facility] or [facility] pending availability. Pt dc anticipated  for today pending MRI results.

## 2011-12-06 NOTE — Consult Note (Signed)
Consulting physician: Dr. Guerry Bruin Reason for consult, Ataxia, Tremors, cognitive decline Date of Consult: 12/06/2011: Charts, medications, labs and images reviewed   CC:  Ataxia, Cognitive decline, tremors  HPI: This is a 71 y/o RHWM  Who  Has progressive decline in cognition, ataxia, decreased po intake and was brought in by his sister for evaluation. Patient was diagnosed with sinusitis and treated appropriately. MRI was done to rule out possible stroke, MRI was negative for stroke and or hydrocephalus. Neurology was consulted for management of the above symptoms Patient seen at the bedside, he is awake, alert and follows commands. Significant slowing in mentation. Patient, has resting tremors which gets worse with distraction. He denies chest pain, no sob, no abdominal pain. He mentions he has difficulty with swallowing.  He has flat affect and  No expressions. He agrees to urinate on himself because he cannot get to the bathroom immediately. He has been having trouble ambulating and falls when he generally turns. He denies hallucinations or delusions,  But agrees that he has poor sleep  Hygiene And based on the  Symptoms he provides he has features of REM sleep disorder.  Additionally, he complaints of memory decline.      Past Medical History  Diagnosis Date  . Dyslipidemia   . Overweight     with body mass index of 29  . HTN (hypertension)   . CAD (coronary artery disease) 2007    occlusion of mid LCX, 50-70% LAD  . Myocardial infarction 1/07    Acute non-ST-elevation myocardial infarction  . Anginal pain     Past Surgical History  Procedure Date  . Cardiac catheterization 03/17/2005    Ejection fraction is estimated at 55%  . Cataract extraction w/ intraocular lens  implant, bilateral ~ 2010  . Knee surgery 1980's    "took knee out and put it back in" ; left     Family History  Problem Relation Age of Onset  . Heart disease Father     Social History:  reports  that he has never smoked. He has never used smokeless tobacco. He reports that he does not drink alcohol or use illicit drugs.  No Known Allergies  Medications:  Scheduled:   . amoxicillin-clavulanate  1 tablet Oral Q12H  . aspirin EC  81 mg Oral Daily  . atorvastatin  40 mg Oral q1800  . influenza  inactive virus vaccine  0.5 mL Intramuscular Tomorrow-1000  . metoprolol succinate  50 mg Oral Daily  . oxymetazoline  1 spray Each Nare BID  . ramipril  10 mg Oral Daily  . sodium chloride  3 mL Intravenous Q12H    ROS: All 12 point review of systems were obtained and pertinent are mentioned in the HPI  Physical Examination: Blood pressure 162/91, pulse 78, temperature 97.9 F (36.6 C), temperature source Oral, resp. rate 21, height 6\' 1"  (1.854 m), weight 99.791 kg (220 lb), SpO2 94.00%.   General: Patient is awake, alert and follows commands HEENT: PEERLA, poor saccades and slow pursuits, no upward gaze palsy, no nystagmus, no ptosis, no  Field cut,  Neck: SUpple, no JVD, no carotid bruit Lungs: CTA Heart: RRR Abdomen; Bowel sounds present Skin; no rash Extremities; Significant muscle wasting, peri-tibial wasting  Neurologic Examination Mental Status:  Awake, alert and follows commands.  Memory: Poor Cognition: : able to count, unable to provide me the names of animals, or fruits = Impaired   Attention and concentration : poor   NO dishinhibition  Pollicomental  Palmomental Glabellar + Myerson+   Speech: impaired  Cranial Nerves: II: visual fields grossly normal, pupils equal, round, reactive to light and accommodation III,IV, VI: ptosis not present, extra-ocular motions intact bilaterally V,VII: smile symmetric, facial light touch sensation normal bilaterally VIII: hearing impaired  bilaterally IX,X: gag reflex present XI: trapezius strength/neck flexion strength normal bilaterally XII: tongue strength normal   Motor: Able to move all the 4 extremities  antigravity.  Tone: Cogwheel rigidity in UE Resting tremors gets worse with distraction No titubation noticed Stooped posture Unable to stand from a sitting position with arms crossed Unable to snap fingers  Unable to carry on fine motor skills Reflexes 1 throughout Plantar: Downgoing b/l  Sensory: intact  Sensory: Pinprick and light touch intact throughout, bilaterally  Gait: Significant slow and static, shuffling,  Unable to turn.  Patient was unable to initiate steps.     Laboratory Studies:   Basic Metabolic Panel:  Lab 12/06/11 1610 12/05/11 0421 12/03/11 0540 12/02/11 0751 12/01/11 1738  NA 139 140 136 -- 135  K 3.6 3.5 3.6 -- 3.4*  CL 103 105 102 -- 98  CO2 25 25 24  -- 21  GLUCOSE 115* 109* 105* -- 141*  BUN 6 8 15  -- 14  CREATININE 0.88 0.85 1.00 1.02 1.01  CALCIUM 8.4 8.4 8.3* -- --  MG -- -- -- -- --  PHOS -- -- -- -- --    Liver Function Tests:  Lab 12/06/11 0517 12/03/11 0540  AST 17 29  ALT 29 43  ALKPHOS 58 52  BILITOT 1.4* 1.6*  PROT 5.8* 5.9*  ALBUMIN 3.0* 3.2*   No results found for this basename: LIPASE:5,AMYLASE:5 in the last 168 hours  Lab 12/01/11 2231  AMMONIA 10*    CBC:  Lab 12/06/11 0517 12/05/11 0421 12/04/11 0458 12/03/11 0540 12/02/11 0751 12/01/11 1738  WBC 9.3 7.5 8.3 12.5* 12.4* --  NEUTROABS -- -- -- -- -- 9.9*  HGB 13.7 13.7 13.5 14.1 15.2 --  HCT 39.4 39.4 39.1 41.1 42.6 --  MCV 86.4 86.4 86.9 88.2 86.2 --  PLT 136* 110* 92* 105* 110* --    Cardiac Enzymes:  Lab 12/05/11 2011 12/05/11 1351 12/05/11 0900  CKTOTAL -- -- 93  CKMB -- -- 3.1  CKMBINDEX -- -- --  TROPONINI <0.30 <0.30 <0.30    BNP: No components found with this basename: POCBNP:5  CBG: No results found for this basename: GLUCAP:5 in the last 168 hours  Microbiology: No results found for this or any previous visit.  Coagulation Studies: No results found for this basename: LABPROT:5,INR:5 in the last 72 hours  Urinalysis:  Lab 12/02/11 0114   COLORURINE AMBER*  LABSPEC 1.035*  PHURINE 5.5  GLUCOSEU NEGATIVE  HGBUR NEGATIVE  BILIRUBINUR MODERATE*  KETONESUR 40*  PROTEINUR 100*  UROBILINOGEN 1.0  NITRITE NEGATIVE  LEUKOCYTESUR NEGATIVE    Lipid Panel:  No results found for this basename: chol, trig, hdl, cholhdl, vldl, ldlcalc    HgbA1C:  No results found for this basename: HGBA1C    Urine Drug Screen:     Component Value Date/Time   LABOPIA NONE DETECTED 12/02/2011 0114   COCAINSCRNUR NONE DETECTED 12/02/2011 0114   LABBENZ NONE DETECTED 12/02/2011 0114   AMPHETMU NONE DETECTED 12/02/2011 0114   THCU NONE DETECTED 12/02/2011 0114   LABBARB NONE DETECTED 12/02/2011 0114    Alcohol Level: No results found for this basename: ETH:2 in the last 168 hours  Other results: EKG: normal EKG, normal  sinus rhythm, unchanged from previous tracings.  Imaging: [name] ZO Contrast  12/06/2011  *RADIOLOGY REPORT*  Clinical Data: Altered mental status.  MRI HEAD WITHOUT AND WITH CONTRAST  Technique:  Multiplanar, multiecho pulse sequences of the brain and surrounding structures were obtained according to standard protocol without and with intravenous contrast  Contrast:  20 ml MultiHance.  Comparison: 12/01/2011 head CT.  No comparison MR.  Findings: No acute infarct.  No intracranial hemorrhage.  Very mild small vessel disease type changes.  Mild age related atrophy without hydrocephalus.  No intracranial mass or abnormal enhancement.  Incidentally noted are prominent perivascular spaces within the basal ganglia.  Major intracranial vascular structures are patent.  Complete opacification left maxillary sinus.  Minimal to mild mucosal thickening ethmoid sinus air cells with minimal mucosal thickening frontal sinuses.  Partially empty sella.  Question mild spinal stenosis C2 level.  IMPRESSION: No acute infarct or intracranial mass.  Complete opacification left maxillary sinus.  Please see above.   Original Report Authenticated By: Fuller Canada, M.D.      Assessment/Plan:    Patient with significant decline in cognition, gait imbalance, resting tremors, rigidity, REM sleep disorder Primitive reflexes, comes with  Decreased PO  Intake Differential Diagnosis 1) Parkinsons Plus  Syndrome 2) Multiple system atrophy 3) There is no visual hallucinations  Not sure if he has at home , if he does that would support Lewy body dementia 4) Neurodegenerative  Disorders 5) Less likely to be NPH even though NPH has parkinsons features  Recommendations: 1) Sinemet 25/100 tid.  If he tolerates will increase the dose,  If hallucinations occur will start on Seroquel 2) PT/OT 3) Fall and Aspiration    Spent approximately 60 minutes of critical care time, reviewing charts, medications, labs and images Jowana Thumma V-P Eilleen Kempf., MD., Ph.D.,MS 12/06/2011 4:54 PM

## 2011-12-07 DIAGNOSIS — G20 Parkinson's disease: Secondary | ICD-10-CM | POA: Diagnosis not present

## 2011-12-07 DIAGNOSIS — J329 Chronic sinusitis, unspecified: Secondary | ICD-10-CM | POA: Diagnosis not present

## 2011-12-07 DIAGNOSIS — R509 Fever, unspecified: Secondary | ICD-10-CM | POA: Diagnosis not present

## 2011-12-07 DIAGNOSIS — E539 Vitamin B deficiency, unspecified: Secondary | ICD-10-CM | POA: Diagnosis not present

## 2011-12-07 DIAGNOSIS — R488 Other symbolic dysfunctions: Secondary | ICD-10-CM | POA: Diagnosis not present

## 2011-12-07 DIAGNOSIS — R262 Difficulty in walking, not elsewhere classified: Secondary | ICD-10-CM | POA: Diagnosis not present

## 2011-12-07 DIAGNOSIS — E860 Dehydration: Secondary | ICD-10-CM | POA: Diagnosis not present

## 2011-12-07 DIAGNOSIS — M6281 Muscle weakness (generalized): Secondary | ICD-10-CM | POA: Diagnosis not present

## 2011-12-07 DIAGNOSIS — I119 Hypertensive heart disease without heart failure: Secondary | ICD-10-CM | POA: Diagnosis not present

## 2011-12-07 DIAGNOSIS — E785 Hyperlipidemia, unspecified: Secondary | ICD-10-CM | POA: Diagnosis not present

## 2011-12-07 DIAGNOSIS — R630 Anorexia: Secondary | ICD-10-CM | POA: Diagnosis not present

## 2011-12-07 DIAGNOSIS — E46 Unspecified protein-calorie malnutrition: Secondary | ICD-10-CM | POA: Diagnosis not present

## 2011-12-07 DIAGNOSIS — I10 Essential (primary) hypertension: Secondary | ICD-10-CM | POA: Diagnosis not present

## 2011-12-07 DIAGNOSIS — R279 Unspecified lack of coordination: Secondary | ICD-10-CM | POA: Diagnosis not present

## 2011-12-07 DIAGNOSIS — I2510 Atherosclerotic heart disease of native coronary artery without angina pectoris: Secondary | ICD-10-CM | POA: Diagnosis not present

## 2011-12-07 LAB — CORTISOL-AM, BLOOD: Cortisol - AM: 17.6 ug/dL (ref 4.3–22.4)

## 2011-12-07 MED ORDER — CARBIDOPA-LEVODOPA 25-100 MG PO TABS: 1.0000 | ORAL_TABLET | Freq: Three times a day (TID) | ORAL | Status: DC

## 2011-12-07 MED ORDER — AMOXICILLIN-POT CLAVULANATE 875-125 MG PO TABS: 1.0000 | ORAL_TABLET | Freq: Two times a day (BID) | ORAL | Status: DC

## 2011-12-07 MED ORDER — ACETAMINOPHEN 325 MG PO TABS
650.0000 mg | ORAL_TABLET | Freq: Four times a day (QID) | ORAL | Status: DC | PRN
Start: 2011-12-07 — End: 2012-02-14

## 2011-12-07 MED ORDER — ALUM & MAG HYDROXIDE-SIMETH 200-200-20 MG/5ML PO SUSP: 30.0000 mL | Freq: Four times a day (QID) | ORAL | Status: DC | PRN

## 2011-12-07 MED ORDER — ZOLPIDEM TARTRATE 5 MG PO TABS: 5.0000 mg | ORAL_TABLET | Freq: Every evening | ORAL | Status: DC | PRN

## 2011-12-07 NOTE — Progress Notes (Addendum)
.  Clinical social worker assisted with patient discharge to skilled nursing facility, [facility]. .Patient transportation will be provided by [employer] with patient chart copy. .No further Clinical Social Work needs, signing off.   Catha Gosselin, Theresia Majors  [phone] .12/07/2011 1058am   Pt transportation scheduled for 1230 by [facility] as agreed up on by pt family and pt rn.

## 2011-12-07 NOTE — Progress Notes (Signed)
Physical Therapy Treatment Patient Details Name: [name] MRN: [id number] DOB: [birth year] Today's Date: 12/07/2011 Time: 4098-1191 PT Time Calculation (min): 16 min  PT Assessment / Plan / Recommendation Comments on Treatment Session  pt presents with Delirium and new dx of Parkinson's.  pt able to tell PT that he has Parkinson's, but otherwise has demonstrated a decrease and cognition and awareness.  pt will continue to need SNF at D/C.      Follow Up Recommendations  Skilled nursing facility    Barriers to Discharge        Equipment Recommendations  Rolling walker with 5" wheels    Recommendations for Other Services OT consult  Frequency Min 3X/week   Plan Discharge plan needs to be updated;Frequency remains appropriate    Precautions / Restrictions Precautions Precautions: Fall Restrictions Weight Bearing Restrictions: No   Pertinent Vitals/Pain Denies pain.      Mobility  Bed Mobility Bed Mobility: Not assessed (pt sitting EOB) Transfers Transfers: Sit to Stand;Stand to Sit Sit to Stand: 4: Min assist;With upper extremity assist;From bed;From toilet Stand to Sit: 4: Min guard;With upper extremity assist;To toilet;To chair/3-in-1;With armrests Details for Transfer Assistance: cues for use of UEs, sequencing, safe technique.   Ambulation/Gait Ambulation/Gait Assistance: 4: Min assist Ambulation Distance (Feet): 15 Feet (15 x2) Assistive device: Rolling walker Ambulation/Gait Assistance Details: pt with festinating gait, flexed posture, improved cadence and decreased freezing with rhythmic cueing Gait Pattern: Step-through pattern;Decreased stride length;Festinating;Trunk flexed Gait velocity: slow Stairs: No Wheelchair Mobility Wheelchair Mobility: No    Exercises     PT Diagnosis:    PT Problem List:   PT Treatment Interventions:     PT Goals Acute Rehab PT Goals Time For Goal Achievement: 12/11/11 PT Goal: Sit to Stand - Progress: Progressing  toward goal PT Goal: Stand to Sit - Progress: Progressing toward goal PT Goal: Ambulate - Progress: Progressing toward goal  Visit Information  Last PT Received On: 12/07/11 Assistance Needed: +1    Subjective Data  Subjective: Can I get up on my own?     Cognition  Overall Cognitive Status: Impaired Area of Impairment: Attention;Memory;Safety/judgement;Awareness of deficits;Problem solving Arousal/Alertness: Awake/alert Orientation Level: Disoriented to;Time Behavior During Session: Flat affect Current Attention Level: Selective Memory Deficits: pt with decreased recall of events of the last few days.   Safety/Judgement: Decreased safety judgement for tasks assessed;Impulsive;Decreased awareness of need for assistance Safety/Judgement - Other Comments: pt attempting to get OOB on his own, with phone cord across legs, sheet under feet, and bed alarm going off.   Awareness of Deficits: pt with decreased awareness of deficits today, stating he thinks he is walking "pretty good".  Just 2 days ago pt had good awareness and was able to tell PT without prompting that he is declining.   Problem Solving: Slow to process and unable to work through safety problems even with cueing.   Cognition - Other Comments: pt's cognition declined today from previous session.      Balance  Balance Balance Assessed: No  End of Session PT - End of Session Equipment Utilized During Treatment: Gait belt Activity Tolerance: Patient limited by fatigue Patient left: in chair;with call bell/phone within reach;with chair alarm set Nurse Communication: Mobility status   GP     Sunny Schlein, [place] [phone] 12/07/2011, 9:05 AM

## 2011-12-07 NOTE — Discharge Summary (Addendum)
DISCHARGE SUMMARY  [name]  MR#: [id number]  DOB:[birth year]  Date of Admission: 12/01/2011 Date of Discharge: 12/07/2011  Attending Physician:[name]  Patient's Timothy W, MD  Consults:Treatment Team:  Timothy Groom, MD  Discharge Diagnoses:  Parkinsons, rapidly progressive with multiple system atrophy  Active Problems:  HTN (hypertension)  CAD (coronary artery disease)  Dehydration, resolved  Fever, resolved  Weakness generalized  Gait disorder related to Parkinson's Falls risk related to Parkinsons Protein calorie malnutrition Vitamin B12 deficiency Sinusitis, left maxillary  Discharge Medications:   Medication List     As of 12/07/2011  6:56 AM    TAKE these medications         acetaminophen 325 MG tablet   Commonly known as: TYLENOL   Take 2 tablets (650 mg total) by mouth every 6 (six) hours as needed (or Fever >/= 101).      alum & mag hydroxide-simeth 200-200-20 MG/5ML suspension   Commonly known as: MAALOX/MYLANTA   Take 30 mLs by mouth every 6 (six) hours as needed (dyspepsia).      amoxicillin-clavulanate 875-125 MG per tablet   Commonly known as: AUGMENTIN   Take 1 tablet by mouth every 12 (twelve) hours.      aspirin EC 81 MG tablet   Take 81 mg by mouth daily.      carbidopa-levodopa 25-100 MG per tablet   Commonly known as: SINEMET IR   Take 1 tablet by mouth 3 (three) times daily.      metoprolol succinate 50 MG 24 hr tablet   Commonly known as: TOPROL-XL   Take 1 tablet (50 mg total) by mouth daily.      ramipril 10 MG capsule   Commonly known as: ALTACE   Take 1 capsule (10 mg total) by mouth daily.      simvastatin 80 MG tablet   Commonly known as: ZOCOR   Take 1 tablet (80 mg total) by mouth at bedtime.      zolpidem 5 MG tablet   Commonly known as: AMBIEN   Take 1 tablet (5 mg total) by mouth at bedtime as needed for sleep (insomnia).          Vitamin B12   IM monthly    Hospital  Procedures: Dg Chest 2 View  12/01/2011  *RADIOLOGY REPORT*  Clinical Data: Weakness.  Generalized body aches.  Fever.  CHEST - 2 VIEW  Comparison: Chest x-ray 03/19/2005.  Findings: Lung volumes are low.  No definite consolidative airspace disease.  No pleural effusions.  Pulmonary vasculature and the cardiomediastinal silhouette are within normal limits allowing for the low lung volumes and patient rotation to the left. Atherosclerosis in the thoracic aorta.  IMPRESSION: 1.  Low lung volumes without radiographic evidence of acute cardiopulmonary disease. 2.  Atherosclerosis.   Original Report Authenticated By: [name], M.D.    Ct Head Wo Contrast  12/01/2011  *RADIOLOGY REPORT*  Clinical Data: Weakness, fever, body aches  CT HEAD WITHOUT CONTRAST  Technique:  Contiguous axial images were obtained from the base of the skull through the vertex without contrast.  Comparison: None.  Findings: No evidence of parenchymal hemorrhage or extra-axial fluid collection. No mass lesion, mass effect, or midline shift.  No CT evidence of acute infarction.  Old right basal ganglia lacunar infarct.  Subcortical white matter and periventricular small vessel ischemic changes. Intracranial atherosclerosis.  Near complete opacification of the left maxillary sinus, likely chronic.  The mastoid air cells are unopacified.  No  evidence of calvarial fracture.  IMPRESSION: No evidence of acute intracranial abnormality.  Near complete opacification of the left maxillary sinus, likely chronic.   Original Report Authenticated By: [name], M.D.    Mr [name] Wo Contrast  12/06/2011  *RADIOLOGY REPORT*  Clinical Data: Altered mental status.  MRI HEAD WITHOUT AND WITH CONTRAST  Technique:  Multiplanar, multiecho pulse sequences of the brain and surrounding structures were obtained according to standard protocol without and with intravenous contrast  Contrast:  20 ml MultiHance.  Comparison: 12/01/2011 head CT.  No  comparison MR.  Findings: No acute infarct.  No intracranial hemorrhage.  Very mild small vessel disease type changes.  Mild age related atrophy without hydrocephalus.  No intracranial mass or abnormal enhancement.  Incidentally noted are prominent perivascular spaces within the basal ganglia.  Major intracranial vascular structures are patent.  Complete opacification left maxillary sinus.  Minimal to mild mucosal thickening ethmoid sinus air cells with minimal mucosal thickening frontal sinuses.  Partially empty sella.  Question mild spinal stenosis C2 level.  IMPRESSION: No acute infarct or intracranial mass.  Complete opacification left maxillary sinus.  Please see above.   Original Report Authenticated By: [name], M.D.     History of Present Illness: Patient is a 71 year old male admitted for weakness and tremor  Hospital Course: [name] is normally seen by myself at [facility] and had had a routine visit earlier in the springtime.  He usually requires only a yearly visit as he has been in remarkably good health.  He was brought to the ER with weakness and difficulty with ambulating.  Was found initially to be dehydrated with a sinus infection by my admitting partner.  On seeing him on Monday and knowing how he is usually independent, I spoke with his sister [name] to get more information on how he had been doing.  She said that he had become a bit slow and clearly was not acting right for a while stating "this is not at all like my brother"  An MRI brain was ordered, which, unfortunately took almost 2 days to be performed due to limited availability at [facility].  This revealed his sinusitis and changes c/Skinner aging only.  Meanwhile he was also found to have a B12 deficiency and was given IM B12.  Neurology consultation was undertaken and their opinion was that he had a version of rapidly progressive Parkinson's.  SInemet was started but was told that many cases  of this kind of Parkinsons are Sinemet resistance.  Concern was that Selegeline may cause hallucinations and could be considered after a trial of Sinemet.  Outpatient referral to Dr. Arbutus Leas, who is a movement disorder specialist, is pending at this time.  Mr. Awad had been seen by PT and considering his living situation with an elderly sister, decision was made to have him go to [place] for rehab and PT until he could return home.  Day of Discharge Exam BP 148/83  Pulse 79  Temp 98.2 F (36.8 C) (Oral)  Resp 19  Ht 6\' 1"  (1.854 m)  Wt 99.791 kg (220 lb)  BMI 29.03 kg/m2  SpO2 94%  Physical Exam: General appearance: Normal, alert Eyes: no scleral icterus Throat: oropharynx moist without erythema Resp: alert, cooperative and appears stated age Cardio: Regular rate and rhythm GI: soft, non-tender; bowel sounds normal; no masses,  no organomegaly Neuro: Weakness with tremor c/Skinner Parkinsons. Extremities: no clubbing, cyanosis or edema  Discharge  Labs:  [facility] 12/06/11 0517 12/05/11 0421  NA 139 140  K 3.6 3.5  CL 103 105  CO2 25 25  GLUCOSE 115* 109*  BUN 6 8  CREATININE 0.88 0.85  CALCIUM 8.4 8.4  MG -- --  PHOS -- --    Basename 12/06/11 0517  AST 17  ALT 29  ALKPHOS 58  BILITOT 1.4*  PROT 5.8*  ALBUMIN 3.0*    Basename 12/06/11 0517 12/05/11 0421  WBC 9.3 7.5  NEUTROABS -- --  HGB 13.7 13.7  HCT 39.4 39.4  MCV 86.4 86.4  PLT 136* 110*   No results found for this basename: INR, PROTIME    Basename 12/05/11 2011 12/05/11 1351 12/05/11 0900  CKTOTAL -- -- 93  CKMB -- -- 3.1  CKMBINDEX -- -- --  TROPONINI <0.30 <0.30 <0.30    Basename 12/04/11 1633  TSH 0.993  T4TOTAL --  T3FREE --  THYROIDAB --    Basename 12/04/11 1633  VITAMINB12 202*  FOLATE --  FERRITIN --  TIBC --  IRON --  RETICCTPCT --    Discharge instructions:   Disposition: To [facility] today (SNF) Follow-up Appts: Follow-up with Dr. Wylene Simmer at [facility] week after discharge from [facility].  He will be followed by one of my partners who covers [place] until then. Arrangement pending to see Dr. Arbutus Leas at Neurology in the next couple of weeks. Condition on Discharge: Stable Tests Needing Follow-up:None  Signed: Gaspar Garbe 12/07/2011, 6:56 AM

## 2011-12-08 DIAGNOSIS — R630 Anorexia: Secondary | ICD-10-CM | POA: Diagnosis not present

## 2011-12-08 DIAGNOSIS — E46 Unspecified protein-calorie malnutrition: Secondary | ICD-10-CM | POA: Diagnosis not present

## 2011-12-08 DIAGNOSIS — G20 Parkinson's disease: Secondary | ICD-10-CM | POA: Diagnosis not present

## 2011-12-08 MED ORDER — GADOBENATE DIMEGLUMINE 529 MG/ML IV SOLN
20.0000 mL | Freq: Once | INTRAVENOUS | Status: AC
  Administered 2011-12-07: 20 mL via INTRAVENOUS

## 2011-12-14 DIAGNOSIS — I2510 Atherosclerotic heart disease of native coronary artery without angina pectoris: Secondary | ICD-10-CM | POA: Diagnosis not present

## 2011-12-14 DIAGNOSIS — G20 Parkinson's disease: Secondary | ICD-10-CM | POA: Diagnosis not present

## 2011-12-14 DIAGNOSIS — I10 Essential (primary) hypertension: Secondary | ICD-10-CM | POA: Diagnosis not present

## 2011-12-14 DIAGNOSIS — E785 Hyperlipidemia, unspecified: Secondary | ICD-10-CM | POA: Diagnosis not present

## 2012-01-01 DIAGNOSIS — I2510 Atherosclerotic heart disease of native coronary artery without angina pectoris: Secondary | ICD-10-CM | POA: Diagnosis not present

## 2012-01-01 DIAGNOSIS — I10 Essential (primary) hypertension: Secondary | ICD-10-CM | POA: Diagnosis not present

## 2012-01-01 DIAGNOSIS — G20 Parkinson's disease: Secondary | ICD-10-CM | POA: Diagnosis not present

## 2012-01-08 DIAGNOSIS — I10 Essential (primary) hypertension: Secondary | ICD-10-CM | POA: Diagnosis not present

## 2012-01-08 DIAGNOSIS — E785 Hyperlipidemia, unspecified: Secondary | ICD-10-CM | POA: Diagnosis not present

## 2012-01-08 DIAGNOSIS — F29 Unspecified psychosis not due to a substance or known physiological condition: Secondary | ICD-10-CM | POA: Diagnosis not present

## 2012-01-09 DIAGNOSIS — G20 Parkinson's disease: Secondary | ICD-10-CM | POA: Diagnosis not present

## 2012-01-09 DIAGNOSIS — I2510 Atherosclerotic heart disease of native coronary artery without angina pectoris: Secondary | ICD-10-CM | POA: Diagnosis not present

## 2012-01-09 DIAGNOSIS — I10 Essential (primary) hypertension: Secondary | ICD-10-CM | POA: Diagnosis not present

## 2012-01-09 DIAGNOSIS — E785 Hyperlipidemia, unspecified: Secondary | ICD-10-CM | POA: Diagnosis not present

## 2012-01-30 DIAGNOSIS — H35059 Retinal neovascularization, unspecified, unspecified eye: Secondary | ICD-10-CM | POA: Diagnosis not present

## 2012-01-30 DIAGNOSIS — H3530 Unspecified macular degeneration: Secondary | ICD-10-CM | POA: Diagnosis not present

## 2012-02-14 ENCOUNTER — Encounter (HOSPITAL_COMMUNITY): Payer: Self-pay | Admitting: *Deleted

## 2012-02-14 ENCOUNTER — Emergency Department (HOSPITAL_COMMUNITY): Payer: Medicare Other

## 2012-02-14 ENCOUNTER — Inpatient Hospital Stay (HOSPITAL_COMMUNITY)
Admission: EM | Admit: 2012-02-14 | Discharge: 2012-02-18 | DRG: 418 | Disposition: A | Discharge status: Home / Self Care | Payer: Medicare Other | Attending: General Surgery | Admitting: General Surgery

## 2012-02-14 DIAGNOSIS — K819 Cholecystitis, unspecified: Secondary | ICD-10-CM | POA: Diagnosis not present

## 2012-02-14 DIAGNOSIS — E663 Overweight: Secondary | ICD-10-CM | POA: Diagnosis present

## 2012-02-14 DIAGNOSIS — Z9089 Acquired absence of other organs: Secondary | ICD-10-CM | POA: Diagnosis not present

## 2012-02-14 DIAGNOSIS — K810 Acute cholecystitis: Principal | ICD-10-CM

## 2012-02-14 DIAGNOSIS — K8010 Calculus of gallbladder with chronic cholecystitis without obstruction: Secondary | ICD-10-CM | POA: Diagnosis not present

## 2012-02-14 DIAGNOSIS — K8050 Calculus of bile duct without cholangitis or cholecystitis without obstruction: Secondary | ICD-10-CM | POA: Insufficient documentation

## 2012-02-14 DIAGNOSIS — N269 Renal sclerosis, unspecified: Secondary | ICD-10-CM | POA: Diagnosis not present

## 2012-02-14 DIAGNOSIS — K828 Other specified diseases of gallbladder: Secondary | ICD-10-CM | POA: Diagnosis not present

## 2012-02-14 DIAGNOSIS — I252 Old myocardial infarction: Secondary | ICD-10-CM

## 2012-02-14 DIAGNOSIS — R4182 Altered mental status, unspecified: Secondary | ICD-10-CM

## 2012-02-14 DIAGNOSIS — R1013 Epigastric pain: Secondary | ICD-10-CM | POA: Diagnosis not present

## 2012-02-14 DIAGNOSIS — I10 Essential (primary) hypertension: Secondary | ICD-10-CM | POA: Diagnosis present

## 2012-02-14 DIAGNOSIS — I517 Cardiomegaly: Secondary | ICD-10-CM | POA: Diagnosis not present

## 2012-02-14 DIAGNOSIS — K8020 Calculus of gallbladder without cholecystitis without obstruction: Secondary | ICD-10-CM | POA: Diagnosis not present

## 2012-02-14 DIAGNOSIS — J9819 Other pulmonary collapse: Secondary | ICD-10-CM | POA: Diagnosis not present

## 2012-02-14 DIAGNOSIS — R4701 Aphasia: Secondary | ICD-10-CM | POA: Diagnosis not present

## 2012-02-14 DIAGNOSIS — E785 Hyperlipidemia, unspecified: Secondary | ICD-10-CM | POA: Diagnosis present

## 2012-02-14 DIAGNOSIS — R109 Unspecified abdominal pain: Secondary | ICD-10-CM | POA: Diagnosis not present

## 2012-02-14 DIAGNOSIS — K5730 Diverticulosis of large intestine without perforation or abscess without bleeding: Secondary | ICD-10-CM | POA: Diagnosis not present

## 2012-02-14 DIAGNOSIS — I2510 Atherosclerotic heart disease of native coronary artery without angina pectoris: Secondary | ICD-10-CM | POA: Diagnosis not present

## 2012-02-14 DIAGNOSIS — Z01818 Encounter for other preprocedural examination: Secondary | ICD-10-CM | POA: Diagnosis not present

## 2012-02-14 HISTORY — DX: Calculus of bile duct without cholangitis or cholecystitis without obstruction: K80.50

## 2012-02-14 HISTORY — DX: Acute cholecystitis: K81.0

## 2012-02-14 LAB — CBC WITH DIFFERENTIAL/PLATELET
Basophils Absolute: 0 10*3/uL (ref 0.0–0.1)
Eosinophils Relative: 1 % (ref 0–5)
HCT: 42.8 % (ref 39.0–52.0)
Lymphocytes Relative: 10 % — ABNORMAL LOW (ref 12–46)
Lymphs Abs: 1.1 10*3/uL (ref 0.7–4.0)
MCV: 91.3 fL (ref 78.0–100.0)
Neutro Abs: 9 10*3/uL — ABNORMAL HIGH (ref 1.7–7.7)
Platelets: 162 10*3/uL (ref 150–400)
RBC: 4.69 MIL/uL (ref 4.22–5.81)
RDW: 14.2 % (ref 11.5–15.5)
WBC: 10.5 10*3/uL (ref 4.0–10.5)

## 2012-02-14 LAB — URINALYSIS, ROUTINE W REFLEX MICROSCOPIC
Bilirubin Urine: NEGATIVE
Hgb urine dipstick: NEGATIVE
Ketones, ur: NEGATIVE mg/dL
Nitrite: NEGATIVE
Protein, ur: NEGATIVE mg/dL
Specific Gravity, Urine: 1.025 (ref 1.005–1.030)
Urobilinogen, UA: 1 mg/dL (ref 0.0–1.0)

## 2012-02-14 LAB — COMPREHENSIVE METABOLIC PANEL
ALT: 14 U/L (ref 0–53)
AST: 26 U/L (ref 0–37)
Alkaline Phosphatase: 60 U/L (ref 39–117)
CO2: 27 mEq/L (ref 19–32)
Calcium: 9.6 mg/dL (ref 8.4–10.5)
Chloride: 105 mEq/L (ref 96–112)
GFR calc Af Amer: >90 mL/min (ref >90)
GFR calc non Af Amer: 82 mL/min — ABNORMAL LOW (ref >90)
Glucose, Bld: 144 mg/dL — ABNORMAL HIGH (ref 70–99)
Potassium: 4.3 mEq/L (ref 3.5–5.1)
Sodium: 142 mEq/L (ref 135–145)
Total Bilirubin: 1 mg/dL (ref 0.3–1.2)

## 2012-02-14 LAB — TROPONIN I: Troponin I: <0.3 ng/mL (ref <0.30)

## 2012-02-14 LAB — SURGICAL PCR SCREEN: Staphylococcus aureus: POSITIVE — AB

## 2012-02-14 MED ORDER — IOHEXOL 300 MG/ML  SOLN
100.0000 mL | Freq: Once | INTRAMUSCULAR | Status: AC | PRN
  Administered 2012-02-14: 100 mL via INTRAVENOUS

## 2012-02-14 MED ORDER — CHLORHEXIDINE GLUCONATE CLOTH 2 % EX PADS
6.0000 | MEDICATED_PAD | Freq: Every day | CUTANEOUS | Status: DC
  Administered 2012-02-14 – 2012-02-17 (×3): 6 via TOPICAL

## 2012-02-14 MED ORDER — METOPROLOL SUCCINATE ER 50 MG PO TB24
50.0000 mg | ORAL_TABLET | Freq: Every day | ORAL | Status: DC
  Administered 2012-02-15 – 2012-02-18 (×4): 50 mg via ORAL
  Filled 2012-02-14 (×4): qty 1

## 2012-02-14 MED ORDER — SODIUM CHLORIDE 0.9 % IV SOLN
3.0000 g | Freq: Four times a day (QID) | INTRAVENOUS | Status: DC
  Administered 2012-02-14 – 2012-02-17 (×12): 3 g via INTRAVENOUS
  Filled 2012-02-14 (×17): qty 3

## 2012-02-14 MED ORDER — RAMIPRIL 5 MG PO CAPS
5.0000 mg | ORAL_CAPSULE | Freq: Every day | ORAL | Status: DC
  Administered 2012-02-14 – 2012-02-18 (×4): 5 mg via ORAL
  Filled 2012-02-14 (×5): qty 1

## 2012-02-14 MED ORDER — KCL IN DEXTROSE-NACL 20-5-0.45 MEQ/L-%-% IV SOLN
INTRAVENOUS | Status: DC
  Administered 2012-02-14: 14:00:00 via INTRAVENOUS
  Administered 2012-02-15: 100 mL via INTRAVENOUS
  Administered 2012-02-15: 100 mL/h via INTRAVENOUS
  Administered 2012-02-16: 10:00:00 via INTRAVENOUS
  Filled 2012-02-14 (×11): qty 1000

## 2012-02-14 MED ORDER — DIPHENHYDRAMINE HCL 50 MG/ML IJ SOLN
12.5000 mg | Freq: Four times a day (QID) | INTRAMUSCULAR | Status: DC | PRN
  Administered 2012-02-17: 12.5 mg via INTRAVENOUS
  Filled 2012-02-14: qty 1

## 2012-02-14 MED ORDER — MORPHINE SULFATE 2 MG/ML IJ SOLN
1.0000 mg | INTRAMUSCULAR | Status: DC | PRN
  Administered 2012-02-15: 2 mg via INTRAVENOUS
  Filled 2012-02-14: qty 1

## 2012-02-14 MED ORDER — ATORVASTATIN CALCIUM 40 MG PO TABS
40.0000 mg | ORAL_TABLET | Freq: Every day | ORAL | Status: DC
  Administered 2012-02-14 – 2012-02-17 (×4): 40 mg via ORAL
  Filled 2012-02-14 (×5): qty 1

## 2012-02-14 MED ORDER — HYDROMORPHONE HCL PF 1 MG/ML IJ SOLN
0.5000 mg | Freq: Once | INTRAMUSCULAR | Status: AC
  Administered 2012-02-14: 0.5 mg via INTRAVENOUS
  Filled 2012-02-14: qty 1

## 2012-02-14 MED ORDER — MUPIROCIN 2 % EX OINT
1.0000 "application " | TOPICAL_OINTMENT | Freq: Two times a day (BID) | CUTANEOUS | Status: DC
  Administered 2012-02-14 – 2012-02-17 (×5): 1 via NASAL
  Filled 2012-02-14: qty 22

## 2012-02-14 MED ORDER — DIPHENHYDRAMINE HCL 12.5 MG/5ML PO ELIX
12.5000 mg | ORAL_SOLUTION | Freq: Four times a day (QID) | ORAL | Status: DC | PRN
  Filled 2012-02-14: qty 5

## 2012-02-14 MED ORDER — ZOLPIDEM TARTRATE 5 MG PO TABS: 5.0000 mg | ORAL_TABLET | Freq: Every evening | ORAL | Status: DC | PRN

## 2012-02-14 MED ORDER — PANTOPRAZOLE SODIUM 40 MG IV SOLR
40.0000 mg | Freq: Every day | INTRAVENOUS | Status: DC
  Administered 2012-02-14 – 2012-02-16 (×3): 40 mg via INTRAVENOUS
  Filled 2012-02-14 (×4): qty 40

## 2012-02-14 MED ORDER — ONDANSETRON HCL 4 MG/2ML IJ SOLN
4.0000 mg | INTRAMUSCULAR | Status: DC | PRN
  Administered 2012-02-14: 4 mg via INTRAVENOUS
  Filled 2012-02-14: qty 2

## 2012-02-14 NOTE — ED Provider Notes (Addendum)
Received sign-over at )710.  Pt is awaiting a CT scan of the abdomen for further evaluation of mid-abdominal pain.  Will review results as available, reassess and provide the appropriate disposition.  Tobin Chad, MD 02/14/12 [phone]  Pt re-examined.  The CT scan has findings concerning for an acute cholecystitis.  He has no significant upper abdominal or RUG pain.  He has a vague mid abdominal tenderness but no rigidity or guarding.  U/S has been ordered.  If abnormal, will consult general surgery for a bedside evaluation.  Tobin Chad, MD 02/14/12 [phone]  Discussed his CT and ultrasound findings with Dr. Mayford Knife (radiologist).  She again notes evidence of cholecystitis on the U/S.  He continues to only ave vague discomfort.  Consulted general surgery and discussed his evaluation with K. Osbourne PA.  She will perform a bedside evaluation.  Anticipate he will be admitted for further evaluation and cholecystectomy.  Tobin Chad, MD 02/14/12 [phone]  Pt will be admitted by the surgery service for further mgmnt.  Tobin Chad, MD 02/14/12 [phone]

## 2012-02-14 NOTE — ED Notes (Signed)
Pt up to bathroom to give urine sample

## 2012-02-14 NOTE — ED Notes (Addendum)
CT notified that pt is done with contrast.

## 2012-02-14 NOTE — ED Notes (Signed)
Patient present to ED with c/o an acute onset of abdominal pain since 10 pm last night.  Pain is located on his upper epigastric area.  Patient denies nausea.  Pain is stationary on his epigastric area.

## 2012-02-14 NOTE — ED Notes (Signed)
Pt states that he began experiencing abdominal pain around 2200 yesterday. Pt denies any diarrhea, however he did have an episode of vomiting while here. Pt currently denies any nausea. Pt rates his pain as 5/10.

## 2012-02-14 NOTE — ED Notes (Addendum)
Pt states that she has not been feeling well for a few days.  She states that she began having N/V at 2300 yesterday. Pt states that she has also been experiencing diarrhea. Pt states that lower abdominal area is tender. Pt rates her pain as 5/10. Pt states that her grandson and niece have both experienced similar episodes recently.

## 2012-02-14 NOTE — H&P (Signed)
Plan lap chole.  Reviewed that he is at risk for open operation.

## 2012-02-14 NOTE — ED Notes (Signed)
Surgery at bedside for consult

## 2012-02-14 NOTE — ED Notes (Signed)
Patient transported to CT 

## 2012-02-14 NOTE — ED Notes (Addendum)
Called CT to ask how long it'd be before pt is scanned, they said they'll bring him oral contrast in a few minutes and they'll scan him once he finishes the contrast.  Pt and EDP Miller notified.

## 2012-02-14 NOTE — ED Provider Notes (Signed)
History     CSN: [id number]  Arrival date & time 02/14/12  0139   First MD Initiated Contact with Patient 02/14/12 0207      Chief Complaint  Patient presents with  . Abdominal Pain    (Consider location/radiation/quality/duration/timing/severity/associated sxs/prior treatment) HPI Comments: 71 y/o male with no significant abdominal history who does have a history of myocardial infarction, hypertension and dyslipidemia presents with a complaint of midabdominal pain. This pain was acute in onset at 10:00 this evening, it has been persistent, improved on arrival after vomiting once. He denies any nausea vomiting or diarrhea at this time, has not had any blood in his stools and has had normal appearing stools up until this time. This pain does not radiate, it is poorly described but is located just above the umbilicus. He denies chest pain, shortness of breath, cough, fever, rashes, swelling or any other complaints. He has never had abdominal surgery.  Patient is a 71 y.o. male presenting with abdominal pain. The history is provided by the patient and a relative.  Abdominal Pain The primary symptoms of the illness include abdominal pain.    Past Medical History  Diagnosis Date  . Dyslipidemia   . Overweight     with body mass index of 29  . HTN (hypertension)   . CAD (coronary artery disease) 2007    occlusion of mid LCX, 50-70% LAD  . Myocardial infarction 1/07    Acute non-ST-elevation myocardial infarction  . Anginal pain     Past Surgical History  Procedure Date  . Cardiac catheterization 03/17/2005    Ejection fraction is estimated at 55%  . Cataract extraction w/ intraocular lens  implant, bilateral ~ 2010  . Knee surgery 1980's    "took knee out and put it back in" ; left     Family History  Problem Relation Age of Onset  . Heart disease Father     History  Substance Use Topics  . Smoking status: Never Smoker   . Smokeless tobacco: Never Used  . Alcohol Use:  No     Comment: 12/05/2011 "stopped all alcohol > 25 years ago; never drank heavily"      Review of Systems  Gastrointestinal: Positive for abdominal pain.  All other systems reviewed and are negative.    Allergies  Review of patient's allergies indicates no known allergies.  Home Medications   Current Outpatient Rx  Name  Route  Sig  Dispense  Refill  . ACETAMINOPHEN 325 MG PO TABS   Oral   Take 2 tablets (650 mg total) by mouth every 6 (six) hours as needed (or Fever >/= 101).   1 tablet   1   . ALUM & MAG HYDROXIDE-SIMETH 200-200-20 MG/5ML PO SUSP   Oral   Take 30 mLs by mouth every 6 (six) hours as needed (dyspepsia).   355 mL      . AMOXICILLIN-POT CLAVULANATE 875-125 MG PO TABS   Oral   Take 1 tablet by mouth every 12 (twelve) hours.         . ASPIRIN EC 81 MG PO TBEC   Oral   Take 81 mg by mouth daily.         Marland Kitchen CARBIDOPA-LEVODOPA 25-100 MG PO TABS   Oral   Take 1 tablet by mouth 3 (three) times daily.   90 tablet   3   . METOPROLOL SUCCINATE ER 50 MG PO TB24   Oral   Take 1 tablet (50 mg  total) by mouth daily.   90 tablet   3   . RAMIPRIL 10 MG PO CAPS   Oral   Take 1 capsule (10 mg total) by mouth daily.   90 capsule   3   . SIMVASTATIN 80 MG PO TABS   Oral   Take 1 tablet (80 mg total) by mouth at bedtime.   90 tablet   3   . ZOLPIDEM TARTRATE 5 MG PO TABS   Oral   Take 1 tablet (5 mg total) by mouth at bedtime as needed for sleep (insomnia).   30 tablet   5     BP 152/74  Pulse 70  Temp 97.3 F (36.3 C) (Oral)  Resp 19  Ht 6\' 1"  (1.854 m)  Wt 210 lb (95.255 kg)  BMI 27.71 kg/m2  SpO2 98%  Physical Exam  Nursing note and vitals reviewed. Constitutional: He appears well-developed and well-nourished. No distress.  HENT:  Head: Normocephalic and atraumatic.  Mouth/Throat: Oropharynx is clear and moist. No oropharyngeal exudate.  Eyes: Conjunctivae normal and EOM are normal. Pupils are equal, round, and reactive to light.  Right eye exhibits no discharge. Left eye exhibits no discharge. No scleral icterus.  Neck: Normal range of motion. Neck supple. No JVD present. No thyromegaly present.  Cardiovascular: Normal rate, regular rhythm, normal heart sounds and intact distal pulses.  Exam reveals no gallop and no friction rub.   No murmur heard. Pulmonary/Chest: Effort normal and breath sounds normal. No respiratory distress. He has no wheezes. He has no rales.  Abdominal: Soft. Bowel sounds are normal. He exhibits no distension and no mass. There is tenderness ( Midabdominal tenderness is mild, no guarding, no peritoneal signs, no masses or pulsating masses palpated.).  Musculoskeletal: Normal range of motion. He exhibits no edema and no tenderness.  Lymphadenopathy:    He has no cervical adenopathy.  Neurological: He is alert. Coordination normal.  Skin: Skin is warm and dry. No rash noted. No erythema.  Psychiatric: He has a normal mood and affect. His behavior is normal.    ED Course  Procedures (including critical care time)  Labs Reviewed - No data to display No results found.   No diagnosis found.    MDM  At this time the patient's vital signs are normal, he has no significant abdominal tenderness but does have the presence of pain. I will obtain laboratory data, obtain a CT scan to rule out a AAA as well as other colonic issues. Lipase, comprehensive metabolic panel pending, IV Dilaudid and Zofran ordered as needed. Patient's pain is 5/10 at this time   ED ECG REPORT  I personally interpreted this EKG   Date: 02/14/2012   Rate: 60  Rhythm: normal sinus rhythm  QRS Axis: normal  Intervals: QRS length and  ST/T Wave abnormalities: normal  Conduction Disutrbances:nonspecific intraventricular conduction delay  Narrative Interpretation:   Old EKG Reviewed: Compared with 12/05/2011, QRS slightly prolonged, otherwise rate slowed and no other significant changes.  Change of shift - care signed out  to Dr. Lorenso Courier.  On my repeat exam prior to sign out - still has minimal periumbilical ttp without guarding or peritoneal sitns.  Normal WBC/   Vida Roller, MD 02/14/12 [phone]

## 2012-02-14 NOTE — H&P (Signed)
[name] 19-Nov-1940  [id number].   Primary Care MD: Dr. Wylene Simmer Primary cardiologist: Dr. Peter [place] Chief Complaint/Reason for Consult: abdominal pain HPI: This is a 71 yo white male who over the last couple of months has had 2 episodes of epigastric abdominal pain that woke him up at night.  These went away within an hour or so.  Last night around 2200, he developed epigastric abdominal pain that was the worst of these episodes.  It was sever in nature.  He denies any nausea or vomiting until he got here to [facility] where he had one episode of emesis and none since then.  His pain went away and has not come back.  He had a CT scan initially that revealed probable acute cholecystitis and ultrasound was recommended.  This was obtained and revealed gallbladder wall thickening and pericholecystic fluid; however the patient has normal labs and no abdominal pain.  He does however have an elevated neutrophil count at 86.  We have been asked to see the patient for further evaluation.  Review of Systems:  Please see HPI, otherwise all other systems have been reviewed and are negative.  Family History  Problem Relation Age of Onset  . Heart disease Father     Past Medical History  Diagnosis Date  . Dyslipidemia   . Overweight     with body mass index of 29  . HTN (hypertension)   . CAD (coronary artery disease) 2007    occlusion of mid LCX, 50-70% LAD  . Myocardial infarction 1/07    Acute non-ST-elevation myocardial infarction  . Anginal pain     Past Surgical History  Procedure Date  . Cardiac catheterization 03/17/2005    Ejection fraction is estimated at 55%  . Cataract extraction w/ intraocular lens  implant, bilateral ~ 2010  . Knee surgery 1980's    "took knee out and put it back in" ; left     Social History:  reports that he has never smoked. He has never used smokeless tobacco. He reports that he does not drink alcohol or use illicit drugs.  Allergies: No Known  Allergies   (Not in a hospital admission)  Blood pressure 126/77, pulse 83, temperature 97.8 F (36.6 C), temperature source Oral, resp. rate 16, height 6\' 1"  (1.854 m), weight 210 lb (95.255 kg), SpO2 96.00%. Physical Exam: General: pleasant, WD, WN white male who is laying in bed in NAD HEENT: head is normocephalic, atraumatic.  Sclera are noninjected.  PERRL.  Ears and nose without any masses or lesions.  Mouth is pink and moist Heart: regular, rate, and rhythm.  Normal s1,s2. No obvious murmurs, gallops, or rubs noted.  Palpable radial and pedal pulses bilaterally Lungs: CTAB, no wheezes, rhonchi, or rales noted.  Respiratory effort nonlabored Abd: soft, NT, ND, +BS, no masses, hernias, or organomegaly MS: all 4 extremities are symmetrical with no cyanosis, clubbing.  Trace bilateral lower extremity edema Skin: warm and dry with no masses, lesions, or rashes Psych: A&Ox3 with an appropriate affect.    Results for orders placed during the hospital encounter of 02/14/12 (from the past 48 hour(s))  TROPONIN I     Status: Normal   Collection Time   02/14/12  1:38 AM      Component Value Range Comment   Troponin I <0.30  <0.30 ng/mL   CBC WITH DIFFERENTIAL     Status: Abnormal   Collection Time   02/14/12  2:17 AM  Component Value Range Comment   WBC 10.5  4.0 - 10.5 K/uL    RBC 4.69  4.22 - 5.81 MIL/uL    Hemoglobin 14.5  13.0 - 17.0 g/dL    HCT 16.1  09.6 - 04.5 %    MCV 91.3  78.0 - 100.0 fL    MCH 30.9  26.0 - 34.0 pg    MCHC 33.9  30.0 - 36.0 g/dL    RDW 40.9  81.1 - 91.4 %    Platelets 162  150 - 400 K/uL    Neutrophils Relative 86 (*) 43 - 77 %    Neutro Abs 9.0 (*) 1.7 - 7.7 K/uL    Lymphocytes Relative 10 (*) 12 - 46 %    Lymphs Abs 1.1  0.7 - 4.0 K/uL    Monocytes Relative 3  3 - 12 %    Monocytes Absolute 0.3  0.1 - 1.0 K/uL    Eosinophils Relative 1  0 - 5 %    Eosinophils Absolute 0.1  0.0 - 0.7 K/uL    Basophils Relative 0  0 - 1 %    Basophils Absolute  0.0  0.0 - 0.1 K/uL   COMPREHENSIVE METABOLIC PANEL     Status: Abnormal   Collection Time   02/14/12  2:17 AM      Component Value Range Comment   Sodium 142  135 - 145 mEq/L    Potassium 4.3  3.5 - 5.1 mEq/L HEMOLYSIS AT THIS LEVEL MAY AFFECT RESULT   Chloride 105  96 - 112 mEq/L    CO2 27  19 - 32 mEq/L    Glucose, Bld 144 (*) 70 - 99 mg/dL    BUN 11  6 - 23 mg/dL    Creatinine, Ser 7.82  0.50 - 1.35 mg/dL    Calcium 9.6  8.4 - 95.6 mg/dL    Total Protein 6.5  6.0 - 8.3 g/dL    Albumin 3.9  3.5 - 5.2 g/dL    AST 26  0 - 37 U/L    ALT 14  0 - 53 U/L    Alkaline Phosphatase 60  39 - 117 U/L    Total Bilirubin 1.0  0.3 - 1.2 mg/dL    GFR calc non Af Amer 82 (*) >90 mL/min    GFR calc Af Amer >90  >90 mL/min   LIPASE, BLOOD     Status: Normal   Collection Time   02/14/12  2:17 AM      Component Value Range Comment   Lipase 28  11 - 59 U/L   URINALYSIS, ROUTINE W REFLEX MICROSCOPIC     Status: Abnormal   Collection Time   02/14/12  3:38 AM      Component Value Range Comment   Color, Urine YELLOW  YELLOW    APPearance CLOUDY (*) CLEAR    Specific Gravity, Urine 1.025  1.005 - 1.030    pH 6.0  5.0 - 8.0    Glucose, UA NEGATIVE  NEGATIVE mg/dL    Hgb urine dipstick NEGATIVE  NEGATIVE    Bilirubin Urine NEGATIVE  NEGATIVE    Ketones, ur NEGATIVE  NEGATIVE mg/dL    Protein, ur NEGATIVE  NEGATIVE mg/dL    Urobilinogen, UA 1.0  0.0 - 1.0 mg/dL    Nitrite NEGATIVE  NEGATIVE    Leukocytes, UA NEGATIVE  NEGATIVE MICROSCOPIC NOT DONE ON URINES WITH NEGATIVE PROTEIN, BLOOD, LEUKOCYTES, NITRITE, OR GLUCOSE <1000 mg/dL.   US Abdomen  Complete  02/14/2012  *RADIOLOGY REPORT*  Clinical Data:  Abdominal pain with abnormal CT scan.  ABDOMINAL ULTRASOUND COMPLETE  Comparison:  CT abdomen pelvis 02/14/2012  Findings:  Gallbladder:  There is a prominent amount of sludge within the gallbladder lumen.  No definite gallstones are identified.  The gallbladder wall is thickened, measuring 6.8 mm on  ultrasound. There are hypoechoic bands of edema within the gallbladder wall. No discrete stones are seen within the gallbladder lumen.  The sonographer reports that the sonographic Murphy's sign is negative.  Common Bile Duct:  Within normal limits in caliber.  Measures 5.7 mm.  Liver: Limited visualization due to some bowel gas.  Appears normal in echogenicity.  No focal hepatic lesion or biliary ductal dilatation is identified.  The portal vein is patent and demonstrates a patent pedal flow.  IVC:  Appears normal.  Pancreas:  The pancreas is not visualized due to bowel gas.  Spleen:  Within normal limits in size and echotexture.  Right kidney:  Normal in length, with mild diffuse cortical thinning.  Normal parenchymal echogenicity.  No evidence of mass or hydronephrosis.  Left kidney:  Normal in length, with mild diffuse cortical thinning.  Normal parenchymal echogenicity.  No evidence of mass or hydronephrosis.  Abdominal Aorta:  No aneurysm identified.  IMPRESSION: The gallbladder wall is thickened densities 6.8 mm) and edematous and contains a prominent amount of sludge.  The sonographer and Dr. Lorenso Courier report that the patient has a negative sonographic and clinical Murphy's sign, respectively. Given the abnormal appearance of the gallbladder wall, acute or chronic cholecystitis cannot be excluded.  Nuclear medicine hepatic biliary scan could be considered for further evaluation, if felt to be indicated.   Original Report Authenticated By: Britta Mccreedy, M.D.    Ct Abdomen Pelvis W Contrast  02/14/2012  *RADIOLOGY REPORT*  Clinical Data: Mid abdominal pain since last night with nausea vomiting this morning  CT ABDOMEN AND PELVIS WITH CONTRAST  Technique:  Multidetector CT imaging of the abdomen and pelvis was performed following the standard protocol during bolus administration of intravenous contrast.  Contrast: OMNIPAQUE IOHEXOL 300 MG/ML  SOLN  Comparison: None.  Findings: The imaged lung bases  demonstrate a mildly enlarged heart size, with some atherosclerotic calcification visualized in the right coronary artery.  There is dependent atelectasis in both lower lobes.  Negative for pleural effusion.  There is mucosal enhancement and probable thickening of the gallbladder wall.  There is circumferential pericholecystic fluid/stranding.  The cystic duct demonstrates mucosal enhancement, and appears somewhat prominent.  Question tiny noncalcified gallstones in the gallbladder fundus on image number 18.  No intrahepatic biliary ductal dilatation.  Common bile duct is normal in caliber.  There are no focal hepatic lesions.  The spleen, adrenal glands, and pancreas are within normal limits.  The stomach is not very distended and has a normal appearance.  The small bowel loops are normal in caliber wall thickness.  Normal appendix.  Extensive colonic diverticulosis is noted, with diverticula seen throughout the length of the colon.  The surrounding mesenteries appear normal.  No evidence of bowel inflammatory change.  The prostate gland is normal.  Probable 7 mm diverticulum from the lateral wall of the urinary bladder.  Bladder wall is otherwise normal.  Kidneys demonstrate mild cortical atrophy.  Overall renal links remain within normal limits.  There is no hydronephrosis or renal mass.  The ureters are normal in caliber.  Abdominal aorta is normal in caliber demonstrates slight scattered atherosclerotic  change. There is no free fluid in the pelvis.  Left inguinal hernia contains fat only.  Bilateral pars defects at L5 with 5 mm anterolisthesis of L5 on S1. Typical degenerative changes of the lumbar spine.  Negative for fracture or suspicious bony abnormality.  IMPRESSION:  1.  Findings highly suspicious for acute cholecystitis.  Suggest further evaluation with right upper quadrant ultrasound. 2.  Extensive colonic diverticulosis, uncomplicated. 3.  Probable small diverticulum left lateral urinary bladder wall.  4.  Mild renal cortical atrophy.   Original Report Authenticated By: Britta Mccreedy, M.D.        Assessment/Plan 1. Acute cholecystitis 2. CAD, h/o MI in 2007 3. Hypercholesterolemia 4. HTN  Plan: 1. The patient is currently asymptomatic; however, based off of his scans and his neutrophil count, I am concerned the patient has a fairly severe cholecystitis.  We will get him admitted and start him on IVFs and abx therapy.  He can have clear liquids today and NPO after midnight tonight for surgical intervention tomorrow.  I have d/w the patient and his two sisters who are present in the room with him.  They all understand and are willing to proceed with the above stated plan.  Najai Waszak E 02/14/2012, 11:25 AM Pager: [phone]

## 2012-02-15 ENCOUNTER — Inpatient Hospital Stay (HOSPITAL_COMMUNITY): Payer: Medicare Other | Admitting: Anesthesiology

## 2012-02-15 ENCOUNTER — Encounter (HOSPITAL_COMMUNITY): Payer: Self-pay | Admitting: Anesthesiology

## 2012-02-15 ENCOUNTER — Encounter (HOSPITAL_COMMUNITY): Admission: EM | Disposition: A | Discharge status: Home / Self Care | Payer: Self-pay | Source: Home / Self Care

## 2012-02-15 ENCOUNTER — Inpatient Hospital Stay (HOSPITAL_COMMUNITY): Payer: Medicare Other

## 2012-02-15 HISTORY — PX: CHOLECYSTECTOMY: SHX55

## 2012-02-15 LAB — CBC
Hemoglobin: 13.6 g/dL (ref 13.0–17.0)
MCH: 30.6 pg (ref 26.0–34.0)
MCHC: 33.3 g/dL (ref 30.0–36.0)
Platelets: 146 10*3/uL — ABNORMAL LOW (ref 150–400)
RDW: 14.4 % (ref 11.5–15.5)

## 2012-02-15 LAB — COMPREHENSIVE METABOLIC PANEL
ALT: 11 U/L (ref 0–53)
AST: 15 U/L (ref 0–37)
Alkaline Phosphatase: 61 U/L (ref 39–117)
CO2: 26 mEq/L (ref 19–32)
Chloride: 103 mEq/L (ref 96–112)
GFR calc non Af Amer: 83 mL/min — ABNORMAL LOW (ref >90)
Potassium: 3.8 mEq/L (ref 3.5–5.1)
Sodium: 138 mEq/L (ref 135–145)
Total Bilirubin: 2 mg/dL — ABNORMAL HIGH (ref 0.3–1.2)

## 2012-02-15 SURGERY — LAPAROSCOPIC CHOLECYSTECTOMY WITH INTRAOPERATIVE CHOLANGIOGRAM
Anesthesia: General | Site: Abdomen | Wound class: Contaminated

## 2012-02-15 MED ORDER — NEOSTIGMINE METHYLSULFATE 1 MG/ML IJ SOLN
INTRAMUSCULAR | Status: DC | PRN
  Administered 2012-02-15: 4 mg via INTRAVENOUS
  Administered 2012-02-15: 1 mg via INTRAVENOUS

## 2012-02-15 MED ORDER — LACTATED RINGERS IV SOLN
INTRAVENOUS | Status: DC
  Administered 2012-02-15: 11:00:00 via INTRAVENOUS

## 2012-02-15 MED ORDER — BUPIVACAINE-EPINEPHRINE PF 0.25-1:200000 % IJ SOLN
INTRAMUSCULAR | Status: AC
  Filled 2012-02-15: qty 30

## 2012-02-15 MED ORDER — LACTATED RINGERS IV SOLN
INTRAVENOUS | Status: DC | PRN
  Administered 2012-02-15 (×2): via INTRAVENOUS

## 2012-02-15 MED ORDER — ONDANSETRON HCL 4 MG/2ML IJ SOLN
INTRAMUSCULAR | Status: DC | PRN
  Administered 2012-02-15: 4 mg via INTRAVENOUS

## 2012-02-15 MED ORDER — LIDOCAINE HCL (PF) 1 % IJ SOLN
INTRAMUSCULAR | Status: AC
  Filled 2012-02-15: qty 30

## 2012-02-15 MED ORDER — LIDOCAINE HCL 1 % IJ SOLN
INTRAMUSCULAR | Status: DC | PRN
  Administered 2012-02-15: 12:00:00

## 2012-02-15 MED ORDER — LIDOCAINE HCL 4 % MT SOLN
OROMUCOSAL | Status: DC | PRN
  Administered 2012-02-15: 4 mL via TOPICAL

## 2012-02-15 MED ORDER — SODIUM CHLORIDE 0.9 % IV SOLN
INTRAVENOUS | Status: DC | PRN
  Administered 2012-02-15: 12:00:00

## 2012-02-15 MED ORDER — GLYCOPYRROLATE 0.2 MG/ML IJ SOLN
INTRAMUSCULAR | Status: DC | PRN
  Administered 2012-02-15: 0.6 mg via INTRAVENOUS
  Administered 2012-02-15: 0.1 mg via INTRAVENOUS

## 2012-02-15 MED ORDER — HYDROMORPHONE HCL PF 1 MG/ML IJ SOLN: 0.2500 mg | INTRAMUSCULAR | Status: DC | PRN

## 2012-02-15 MED ORDER — OXYCODONE-ACETAMINOPHEN 5-325 MG PO TABS: 1.0000 | ORAL_TABLET | ORAL | Status: DC | PRN

## 2012-02-15 MED ORDER — PROPOFOL 10 MG/ML IV BOLUS
INTRAVENOUS | Status: DC | PRN
  Administered 2012-02-15: 150 mg via INTRAVENOUS

## 2012-02-15 MED ORDER — SODIUM CHLORIDE 0.9 % IR SOLN
Status: DC | PRN
  Administered 2012-02-15: 1000 mL

## 2012-02-15 MED ORDER — LIDOCAINE HCL (CARDIAC) 20 MG/ML IV SOLN
INTRAVENOUS | Status: DC | PRN
  Administered 2012-02-15: 80 mg via INTRAVENOUS

## 2012-02-15 MED ORDER — MIDAZOLAM HCL 5 MG/5ML IJ SOLN
INTRAMUSCULAR | Status: DC | PRN
  Administered 2012-02-15 (×2): 1 mg via INTRAVENOUS

## 2012-02-15 MED ORDER — OXYCODONE HCL 5 MG PO TABS: 5.0000 mg | ORAL_TABLET | Freq: Once | ORAL | Status: DC | PRN

## 2012-02-15 MED ORDER — ONDANSETRON HCL 4 MG/2ML IJ SOLN: 4.0000 mg | Freq: Four times a day (QID) | INTRAMUSCULAR | Status: DC | PRN

## 2012-02-15 MED ORDER — OXYCODONE HCL 5 MG/5ML PO SOLN: 5.0000 mg | Freq: Once | ORAL | Status: DC | PRN

## 2012-02-15 MED ORDER — ROCURONIUM BROMIDE 100 MG/10ML IV SOLN
INTRAVENOUS | Status: DC | PRN
  Administered 2012-02-15: 10 mg via INTRAVENOUS
  Administered 2012-02-15: 50 mg via INTRAVENOUS

## 2012-02-15 MED ORDER — FENTANYL CITRATE 0.05 MG/ML IJ SOLN
INTRAMUSCULAR | Status: DC | PRN
  Administered 2012-02-15 (×4): 50 ug via INTRAVENOUS

## 2012-02-15 MED ORDER — 0.9 % SODIUM CHLORIDE (POUR BTL) OPTIME
TOPICAL | Status: DC | PRN
  Administered 2012-02-15: 1000 mL

## 2012-02-15 SURGICAL SUPPLY — 50 items
APPLIER CLIP ROT 10 11.4 M/L (STAPLE) ×2
BLADE SURG ROTATE 9660 (MISCELLANEOUS) ×2 IMPLANT
CANISTER SUCTION 2500CC (MISCELLANEOUS) ×2 IMPLANT
CHLORAPREP W/TINT 26ML (MISCELLANEOUS) ×2 IMPLANT
CLIP APPLIE ROT 10 11.4 M/L (STAPLE) ×1 IMPLANT
CLOTH BEACON ORANGE TIMEOUT ST (SAFETY) ×2 IMPLANT
COVER MAYO STAND STRL (DRAPES) ×2 IMPLANT
COVER SURGICAL LIGHT HANDLE (MISCELLANEOUS) ×2 IMPLANT
DECANTER SPIKE VIAL GLASS SM (MISCELLANEOUS) IMPLANT
DERMABOND ADHESIVE PROPEN (GAUZE/BANDAGES/DRESSINGS) ×1
DERMABOND ADVANCED (GAUZE/BANDAGES/DRESSINGS) ×1
DERMABOND ADVANCED .7 DNX12 (GAUZE/BANDAGES/DRESSINGS) ×1 IMPLANT
DERMABOND ADVANCED .7 DNX6 (GAUZE/BANDAGES/DRESSINGS) ×1 IMPLANT
DRAPE C-ARM 42X72 X-RAY (DRAPES) ×2 IMPLANT
DRAPE UTILITY 15X26 W/TAPE STR (DRAPE) ×4 IMPLANT
DRAPE WARM FLUID 44X44 (DRAPE) ×2 IMPLANT
ELECT REM PT RETURN 9FT ADLT (ELECTROSURGICAL) ×2
ELECTRODE REM PT RTRN 9FT ADLT (ELECTROSURGICAL) ×1 IMPLANT
FILTER SMOKE EVAC LAPAROSHD (FILTER) IMPLANT
GLOVE BIO SURGEON STRL SZ 6 (GLOVE) ×2 IMPLANT
GLOVE BIO SURGEON STRL SZ7.5 (GLOVE) ×2 IMPLANT
GLOVE BIO SURGEON STRL SZ8 (GLOVE) ×2 IMPLANT
GLOVE BIOGEL PI IND STRL 6.5 (GLOVE) ×2 IMPLANT
GLOVE BIOGEL PI IND STRL 7.0 (GLOVE) ×1 IMPLANT
GLOVE BIOGEL PI IND STRL 8 (GLOVE) ×1 IMPLANT
GLOVE BIOGEL PI INDICATOR 6.5 (GLOVE) ×2
GLOVE BIOGEL PI INDICATOR 7.0 (GLOVE) ×1
GLOVE BIOGEL PI INDICATOR 8 (GLOVE) ×1
GLOVE ECLIPSE 6.5 STRL STRAW (GLOVE) ×2 IMPLANT
GLOVE SURG SS PI 7.0 STRL IVOR (GLOVE) ×2 IMPLANT
GOWN PREVENTION PLUS XLARGE (GOWN DISPOSABLE) ×2 IMPLANT
GOWN PREVENTION PLUS XXLARGE (GOWN DISPOSABLE) ×2 IMPLANT
GOWN STRL NON-REIN LRG LVL3 (GOWN DISPOSABLE) ×4 IMPLANT
KIT BASIN OR (CUSTOM PROCEDURE TRAY) ×2 IMPLANT
KIT ROOM TURNOVER OR (KITS) ×2 IMPLANT
NS IRRIG 1000ML POUR BTL (IV SOLUTION) ×2 IMPLANT
PAD ARMBOARD 7.5X6 YLW CONV (MISCELLANEOUS) ×2 IMPLANT
POUCH SPECIMEN RETRIEVAL 10MM (ENDOMECHANICALS) ×2 IMPLANT
SCISSORS LAP 5X35 DISP (ENDOMECHANICALS) ×2 IMPLANT
SET CHOLANGIOGRAPH 5 50 .035 (SET/KITS/TRAYS/PACK) ×2 IMPLANT
SET IRRIG TUBING LAPAROSCOPIC (IRRIGATION / IRRIGATOR) ×2 IMPLANT
SLEEVE ENDOPATH XCEL 5M (ENDOMECHANICALS) ×2 IMPLANT
SPECIMEN JAR SMALL (MISCELLANEOUS) ×2 IMPLANT
SUT MNCRL AB 4-0 PS2 18 (SUTURE) ×2 IMPLANT
TOWEL OR 17X24 6PK STRL BLUE (TOWEL DISPOSABLE) ×2 IMPLANT
TOWEL OR 17X26 10 PK STRL BLUE (TOWEL DISPOSABLE) ×2 IMPLANT
TRAY LAPAROSCOPIC (CUSTOM PROCEDURE TRAY) ×2 IMPLANT
TROCAR XCEL BLUNT TIP 100MML (ENDOMECHANICALS) ×2 IMPLANT
TROCAR XCEL NON-BLD 11X100MML (ENDOMECHANICALS) ×2 IMPLANT
TROCAR XCEL NON-BLD 5MMX100MML (ENDOMECHANICALS) ×2 IMPLANT

## 2012-02-15 NOTE — Progress Notes (Signed)
T bili up.  Will check cholangiogram.  May need GI involvement if stone on C-gram. Reviewed surgery. Plan lap chole today.

## 2012-02-15 NOTE — Preoperative (Signed)
Beta Blockers   Reason not to administer Beta Blockers:Not Applicable 

## 2012-02-15 NOTE — Transfer of Care (Signed)
Immediate Anesthesia Transfer of Care Note  Patient: [name]  Procedure(s) Performed: Procedure(s) (LRB) with comments: LAPAROSCOPIC CHOLECYSTECTOMY WITH INTRAOPERATIVE CHOLANGIOGRAM (N/A)  Patient Location: PACU  Anesthesia Type:General  Level of Consciousness: awake, alert  and oriented  Airway & Oxygen Therapy: Patient Spontanous Breathing and Patient connected to nasal cannula oxygen  Post-op Assessment: Report given to PACU RN and Post -op Vital signs reviewed and stable  Post vital signs: Reviewed and stable  Complications: No apparent anesthesia complications

## 2012-02-15 NOTE — Anesthesia Preprocedure Evaluation (Signed)
Anesthesia Evaluation  Patient identified by MRN, date of birth, ID band Patient awake    Reviewed: Allergy & Precautions, H&P , NPO status , Patient's Chart, lab work & pertinent test results  Airway Mallampati: II  Neck ROM: full    Dental   Pulmonary          Cardiovascular hypertension, + angina + CAD and + Past MI     Neuro/Psych    GI/Hepatic   Endo/Other    Renal/GU      Musculoskeletal   Abdominal   Peds  Hematology   Anesthesia Other Findings   Reproductive/Obstetrics                           Anesthesia Physical Anesthesia Plan  ASA: III  Anesthesia Plan: General   Post-op Pain Management:    Induction: Intravenous  Airway Management Planned: Oral ETT  Additional Equipment:   Intra-op Plan:   Post-operative Plan: Extubation in OR  Informed Consent: I have reviewed the patients History and Physical, chart, labs and discussed the procedure including the risks, benefits and alternatives for the proposed anesthesia with the patient or authorized representative who has indicated his/her understanding and acceptance.     Plan Discussed with: CRNA and Surgeon  Anesthesia Plan Comments:         Anesthesia Quick Evaluation

## 2012-02-15 NOTE — Anesthesia Postprocedure Evaluation (Signed)
Anesthesia Post Note  Patient: [name]  Procedure(s) Performed: Procedure(s) (LRB): LAPAROSCOPIC CHOLECYSTECTOMY WITH INTRAOPERATIVE CHOLANGIOGRAM (N/A)  Anesthesia type: General  Patient location: PACU  Post pain: Pain level controlled and Adequate analgesia  Post assessment: Post-op Vital signs reviewed, Patient's Cardiovascular Status Stable, Respiratory Function Stable, Patent Airway and Pain level controlled  Last Vitals:  Filed Vitals:   02/15/12 1411  BP: 118/61  Pulse: 87  Temp:   Resp: 21    Post vital signs: Reviewed and stable  Level of consciousness: awake, alert  and oriented  Complications: No apparent anesthesia complications

## 2012-02-15 NOTE — Op Note (Signed)
Laparoscopic Cholecystectomy with IOC Procedure Note  Indications: This patient presents with acute cholecystitis and will undergo laparoscopic cholecystectomy.  Pre-operative Diagnosis: acute cholecystitis  Post-operative Diagnosis: Same  Surgeon: Almond Lint   Assistants: Violeta Gelinas  Anesthesia: General endotracheal anesthesia and local  ASA Class: 3  Procedure Details  The patient was seen again in the Holding Room. The risks, benefits, complications, treatment options, and expected outcomes were discussed with the patient. The possibilities of  bleeding, recurrent infection, damage to nearby structures, the need for additional procedures, failure to diagnose a condition, the possible need to convert to an open procedure, and creating a complication requiring transfusion or operation were discussed with the patient. The likelihood of improving the patient's symptoms with return to their baseline status is good.    The patient and/or family concurred with the proposed plan, giving informed consent. The site of surgery properly noted. The patient was taken to Operating Room, and the procedure verified as Laparoscopic Cholecystectomy with Intraoperative Cholangiogram. A Time Out was held and the above information confirmed.  Prior to the induction of general anesthesia, antibiotic prophylaxis was administered. General endotracheal anesthesia was then administered and tolerated well. After the induction, the abdomen was prepped with Chloraprep and draped in the sterile fashion. The patient was positioned in the supine position.  Local anesthetic agent was injected into the skin near the umbilicus and an incision made. We dissected down to the abdominal fascia with blunt dissection.  The fascia was incised vertically and we entered the peritoneal cavity bluntly.  A pursestring suture of 0-Vicryl was placed around the fascial opening.  The Hasson cannula was inserted and secured with the  stay suture.  Pneumoperitoneum was then created with CO2 and tolerated well without any adverse changes in the patient's vital signs. An 11-mm port was placed in the subxiphoid position.  Two 5-mm ports were placed in the right upper quadrant. All skin incisions were infiltrated with a local anesthetic agent before making the incision and placing the trocars.   We positioned the patient in reverse Trendelenburg, tilted slightly to the patient's left.  The gallbladder was identified, the fundus grasped and retracted cephalad. Adhesions were lysed bluntly and with the electrocautery where indicated, taking care not to injure any adjacent organs or viscus. The infundibulum was grasped and retracted laterally, exposing the peritoneum overlying the triangle of Calot.  The gallbladder was very friable, and there was some biliary spillage when the gallbladder was grasped.  The bile was very sludgelike. This was then divided and exposed in a blunt fashion. A critical view of the cystic duct and cystic artery was obtained.  The cystic duct was clearly identified and bluntly dissected circumferentially. The cystic duct was ligated with a clip distally.   An incision was made in the cystic duct and the [facility] cholangiogram catheter introduced. The catheter was secured using a clip. A cholangiogram was then performed, demonstrating good filling of left and right hepatic ducts and duodenum, no meniscal filling defects.    The cystic duct was then ligated with clips and divided. The cystic artery was identified, dissected free, ligated with clips and divided as well.   The gallbladder was dissected from the liver bed in retrograde fashion with the electrocautery. The gallbladder was removed and placed in an Endocatch bag.  The gallbladder and Endocatch bag were then removed through the umbilical port site.  The liver bed was irrigated and inspected. Hemostasis was achieved with the electrocautery. Copious irrigation was  utilized  and was repeatedly aspirated until clear.    We again inspected the right upper quadrant for hemostasis.  Pneumoperitoneum was released as we removed the trocars.   The pursestring suture was used to close the umbilical fascia.  4-0 Monocryl was used to close the skin.   The skin was cleaned and dry, and Dermabond was applied. The patient was then extubated and brought to the recovery room in stable condition. Instrument, sponge, and needle counts were correct at closure and at the conclusion of the case.   Findings: Acute inflammation.    Estimated Blood Loss: <50         Drains: none          Specimens: Gallbladder to pathology       Complications: None; patient tolerated the procedure well.         Disposition: PACU - hemodynamically stable.         Condition: stable

## 2012-02-15 NOTE — Progress Notes (Signed)
Patient ID: [name], male   DOB: [birth year], 71 y.o.   MRN: [id number]    Subjective: Pt continues to feel ok.  Denies pain  Objective: Vital signs in last 24 hours: Temp:  [97.5 F (36.4 C)-98.7 F (37.1 C)] 98.2 F (36.8 C) (12/05 0510) Pulse Rate:  [65-86] 77  (12/05 0510) Resp:  [14-21] 18  (12/05 0510) BP: (104-127)/(54-77) 107/54 mmHg (12/05 0510) SpO2:  [92 %-99 %] 93 % (12/05 0510) Last BM Date: 02/12/12  Intake/Output from previous day: 12/04 0701 - 12/05 0700 In: 1200 [I.V.:1200] Out: 650 [Urine:650] Intake/Output this shift:    PE: Abd: soft, mildly tender in RUQ, +BS  Lab Results:   [facility] 02/15/12 0610 02/14/12 0217  WBC 9.1 10.5  HGB 13.6 14.5  HCT 40.9 42.8  PLT 146* 162   BMET  Basename 02/14/12 0217  NA 142  K 4.3  CL 105  CO2 27  GLUCOSE 144*  BUN 11  CREATININE 0.90  CALCIUM 9.6   PT/INR No results found for this basename: LABPROT:2,INR:2 in the last 72 hours CMP     Component Value Date/Time   NA 142 02/14/2012 0217   K 4.3 02/14/2012 0217   CL 105 02/14/2012 0217   CO2 27 02/14/2012 0217   GLUCOSE 144* 02/14/2012 0217   BUN 11 02/14/2012 0217   CREATININE 0.90 02/14/2012 0217   CALCIUM 9.6 02/14/2012 0217   PROT 6.5 02/14/2012 0217   ALBUMIN 3.9 02/14/2012 0217   AST 26 02/14/2012 0217   ALT 14 02/14/2012 0217   ALKPHOS 60 02/14/2012 0217   BILITOT 1.0 02/14/2012 0217   GFRNONAA 82* 02/14/2012 0217   GFRAA >90 02/14/2012 0217   Lipase     Component Value Date/Time   LIPASE 28 02/14/2012 0217       Studies/Results: US Abdomen Complete  02/14/2012  *RADIOLOGY REPORT*  Clinical Data:  Abdominal pain with abnormal CT scan.  ABDOMINAL ULTRASOUND COMPLETE  Comparison:  CT abdomen pelvis 02/14/2012  Findings:  Gallbladder:  There is a prominent amount of sludge within the gallbladder lumen.  No definite gallstones are identified.  The gallbladder wall is thickened, measuring 6.8 mm on ultrasound. There are hypoechoic bands of edema  within the gallbladder wall. No discrete stones are seen within the gallbladder lumen.  The sonographer reports that the sonographic Murphy's sign is negative.  Common Bile Duct:  Within normal limits in caliber.  Measures 5.7 mm.  Liver: Limited visualization due to some bowel gas.  Appears normal in echogenicity.  No focal hepatic lesion or biliary ductal dilatation is identified.  The portal vein is patent and demonstrates a patent pedal flow.  IVC:  Appears normal.  Pancreas:  The pancreas is not visualized due to bowel gas.  Spleen:  Within normal limits in size and echotexture.  Right kidney:  Normal in length, with mild diffuse cortical thinning.  Normal parenchymal echogenicity.  No evidence of mass or hydronephrosis.  Left kidney:  Normal in length, with mild diffuse cortical thinning.  Normal parenchymal echogenicity.  No evidence of mass or hydronephrosis.  Abdominal Aorta:  No aneurysm identified.  IMPRESSION: The gallbladder wall is thickened densities 6.8 mm) and edematous and contains a prominent amount of sludge.  The sonographer and Dr. Lorenso Courier report that the patient has a negative sonographic and clinical Murphy's sign, respectively. Given the abnormal appearance of the gallbladder wall, acute or chronic cholecystitis cannot be excluded.  Nuclear medicine hepatic biliary scan could be considered  for further evaluation, if felt to be indicated.   Original Report Authenticated By: Britta Mccreedy, M.D.    Ct Abdomen Pelvis W Contrast  02/14/2012  *RADIOLOGY REPORT*  Clinical Data: Mid abdominal pain since last night with nausea vomiting this morning  CT ABDOMEN AND PELVIS WITH CONTRAST  Technique:  Multidetector CT imaging of the abdomen and pelvis was performed following the standard protocol during bolus administration of intravenous contrast.  Contrast: OMNIPAQUE IOHEXOL 300 MG/ML  SOLN  Comparison: None.  Findings: The imaged lung bases demonstrate a mildly enlarged heart size, with some  atherosclerotic calcification visualized in the right coronary artery.  There is dependent atelectasis in both lower lobes.  Negative for pleural effusion.  There is mucosal enhancement and probable thickening of the gallbladder wall.  There is circumferential pericholecystic fluid/stranding.  The cystic duct demonstrates mucosal enhancement, and appears somewhat prominent.  Question tiny noncalcified gallstones in the gallbladder fundus on image number 18.  No intrahepatic biliary ductal dilatation.  Common bile duct is normal in caliber.  There are no focal hepatic lesions.  The spleen, adrenal glands, and pancreas are within normal limits.  The stomach is not very distended and has a normal appearance.  The small bowel loops are normal in caliber wall thickness.  Normal appendix.  Extensive colonic diverticulosis is noted, with diverticula seen throughout the length of the colon.  The surrounding mesenteries appear normal.  No evidence of bowel inflammatory change.  The prostate gland is normal.  Probable 7 mm diverticulum from the lateral wall of the urinary bladder.  Bladder wall is otherwise normal.  Kidneys demonstrate mild cortical atrophy.  Overall renal links remain within normal limits.  There is no hydronephrosis or renal mass.  The ureters are normal in caliber.  Abdominal aorta is normal in caliber demonstrates slight scattered atherosclerotic change. There is no free fluid in the pelvis.  Left inguinal hernia contains fat only.  Bilateral pars defects at L5 with 5 mm anterolisthesis of L5 on S1. Typical degenerative changes of the lumbar spine.  Negative for fracture or suspicious bony abnormality.  IMPRESSION:  1.  Findings highly suspicious for acute cholecystitis.  Suggest further evaluation with right upper quadrant ultrasound. 2.  Extensive colonic diverticulosis, uncomplicated. 3.  Probable small diverticulum left lateral urinary bladder wall. 4.  Mild renal cortical atrophy.   Original Report  Authenticated By: Britta Mccreedy, M.D.     Anti-infectives: Anti-infectives     Start     Dose/Rate Route Frequency Ordered Stop   02/14/12 1400   Ampicillin-Sulbactam (UNASYN) 3 g in sodium chloride 0.9 % 100 mL IVPB        3 g 100 mL/hr over 60 Minutes Intravenous Every 6 hours 02/14/12 1305             Assessment/Plan  1. Acute cholecystitis  Plan: 1. Plan for OR this morning for cholecystectomy.  Patient agreeable.   LOS: 1 day    Electra Paladino E 02/15/2012, 8:26 AM Pager: [phone]

## 2012-02-16 ENCOUNTER — Encounter (HOSPITAL_COMMUNITY): Payer: Self-pay | Admitting: General Surgery

## 2012-02-16 ENCOUNTER — Inpatient Hospital Stay (HOSPITAL_COMMUNITY): Payer: Medicare Other

## 2012-02-16 DIAGNOSIS — R4182 Altered mental status, unspecified: Secondary | ICD-10-CM

## 2012-02-16 LAB — CBC
Platelets: 141 10*3/uL — ABNORMAL LOW (ref 150–400)
RBC: 4.45 MIL/uL (ref 4.22–5.81)
WBC: 9.9 10*3/uL (ref 4.0–10.5)

## 2012-02-16 LAB — COMPREHENSIVE METABOLIC PANEL
BUN: 6 mg/dL (ref 6–23)
CO2: 28 mEq/L (ref 19–32)
Chloride: 101 mEq/L (ref 96–112)
Creatinine, Ser: 0.86 mg/dL (ref 0.50–1.35)
GFR calc non Af Amer: 84 mL/min — ABNORMAL LOW (ref >90)
Total Bilirubin: 2 mg/dL — ABNORMAL HIGH (ref 0.3–1.2)

## 2012-02-16 MED ORDER — GADOBENATE DIMEGLUMINE 529 MG/ML IV SOLN
20.0000 mL | Freq: Once | INTRAVENOUS | Status: AC
  Administered 2012-02-16: 20 mL via INTRAVENOUS

## 2012-02-16 MED ORDER — HEPARIN SODIUM (PORCINE) 5000 UNIT/ML IJ SOLN
5000.0000 [IU] | Freq: Three times a day (TID) | INTRAMUSCULAR | Status: DC
  Administered 2012-02-16 – 2012-02-18 (×4): 5000 [IU] via SUBCUTANEOUS
  Filled 2012-02-16 (×9): qty 1

## 2012-02-16 NOTE — Progress Notes (Signed)
Patient ID: [name], male   DOB: 12-01-1940, 71 y.o.   MRN: [id number] Pt back from [facility], which was negative for a CBD stone.  Sisters at bedside and stated to the RN that patient was having increased difficulty with expressive language.  He also wasn't "looking like himself."  "He is acting like he did back in September, but milder."  We came to see the patient who indeed is having some expressive aphasia with delayed speech patterns.  He has a flatter affect than he did this morning when I saw him with flattening of his nasal labial folds.  He is able to smile, but not big.  Cranial nerves seem to be relatively intact.  Biggest change is cognition.  We have started the patient on Heparin TID.  We have also called neurology to assess the patient given his history in September.  [name] E 2:49 PM

## 2012-02-16 NOTE — Progress Notes (Signed)
Bili at 2 still. Will get MRCP.   If no stone stuck, will get repeat LFTs in AM.

## 2012-02-16 NOTE — Progress Notes (Signed)
Patient ID: CHAIS FEHRINGER, male   DOB: 1940-05-03, 71 y.o.   MRN: [id number] 1 Day Post-Op  Subjective: Pt feels well today.  Wants to go home.  Eating clears with no issues.  Objective: Vital signs in last 24 hours: Temp:  [97.4 F (36.3 C)-99.2 F (37.3 C)] 97.9 F (36.6 C) (12/06 0521) Pulse Rate:  [66-89] 77  (12/06 0521) Resp:  [13-27] 18  (12/06 0521) BP: (113-153)/(52-99) 114/67 mmHg (12/06 0521) SpO2:  [90 %-99 %] 98 % (12/06 0521) Last BM Date: 02/12/12  Intake/Output from previous day: 12/05 0701 - 12/06 0700 In: 4300 [I.V.:3900; IV Piggyback:400] Out: 1175 [Urine:1175] Intake/Output this shift:    PE: Abd: soft, appropriately tender, +BS, ND, incisions c/d/i  Lab Results:   Basename 02/16/12 0627 02/15/12 0610  WBC 9.9 9.1  HGB 13.9 13.6  HCT 40.9 40.9  PLT 141* 146*   BMET  Basename 02/16/12 0627 02/15/12 0610  NA 137 138  K 4.2 3.8  CL 101 103  CO2 28 26  GLUCOSE 135* 141*  BUN 6 6  CREATININE 0.86 0.89  CALCIUM 8.5 8.7   PT/INR No results found for this basename: LABPROT:2,INR:2 in the last 72 hours CMP     Component Value Date/Time   NA 137 02/16/2012 0627   K 4.2 02/16/2012 0627   CL 101 02/16/2012 0627   CO2 28 02/16/2012 0627   GLUCOSE 135* 02/16/2012 0627   BUN 6 02/16/2012 0627   CREATININE 0.86 02/16/2012 0627   CALCIUM 8.5 02/16/2012 0627   PROT 5.5* 02/16/2012 0627   ALBUMIN 2.9* 02/16/2012 0627   AST 36 02/16/2012 0627   ALT 35 02/16/2012 0627   ALKPHOS 55 02/16/2012 0627   BILITOT 2.0* 02/16/2012 0627   GFRNONAA 84* 02/16/2012 0627   GFRAA >90 02/16/2012 0627   Lipase     Component Value Date/Time   LIPASE 28 02/14/2012 0217       Studies/Results: Dg Cholangiogram Operative  02/15/2012  *RADIOLOGY REPORT*  Clinical Data:   Laparoscopic cholecystectomy with intraoperative cholangiogram  INTRAOPERATIVE CHOLANGIOGRAM  Technique:  Cholangiographic images from the C-arm fluoroscopic device were submitted for interpretation  post-operatively.  Please see the procedural report for the amount of contrast and the fluoroscopy time utilized.  Comparison:  None.  Findings:  The intra and extrahepatic biliary tree are normal in caliber and smooth.  There are some subtle round filling defects in the left hepatic duct and in the common hepatic and adjacent common bile duct that are likely air bubbles.  There are no convincing duct stones.  Contrast freely empties into the duodenum.  IMPRESSION: Subtle round duct filling defects felt to be due to air bubbles. Exam otherwise unremarkable.   Original Report Authenticated By: Amie Portland, M.D.    Dg Chest Port 1 View  02/15/2012  *RADIOLOGY REPORT*  Clinical Data: Preop for gallbladder surgery  PORTABLE CHEST - 1 VIEW  Comparison: 12/01/2011  Findings: Apical lordotic frontal view. Midline trachea.  Moderate cardiomegaly.  Tortuous descending thoracic aorta. No pleural effusion or pneumothorax.  No congestive failure.  Mild bibasilar volume loss/scarring.  IMPRESSION: Cardiomegaly, without acute disease.   Original Report Authenticated By: Jeronimo Greaves, M.D.     Anti-infectives: Anti-infectives     Start     Dose/Rate Route Frequency Ordered Stop   02/14/12 1400   Ampicillin-Sulbactam (UNASYN) 3 g in sodium chloride 0.9 % 100 mL IVPB        3 g 100 mL/hr over 60  Minutes Intravenous Every 6 hours 02/14/12 1305             Assessment/Plan  1. S/p lap chole 2. ? CBD stone vs air bubble, TB up to 2.0  Plan: 1. Will keep NPO for MRCP today.  If positive for stone will call GI for ERCP.  If negative then hopefully home soon.   LOS: 2 days    Illyanna Petillo E 02/16/2012, 9:40 AM Pager: [phone]

## 2012-02-16 NOTE — Consult Note (Addendum)
Reason for Consult:Expressive aphasia Referring Physician: Donell Beers  CC: Episode of expressive aphasia after MRCP  HPI: [name] is an 71 y.o. male admitted secondary to abdominal pain.  In work up underwent MRCP today and afterward was felt by family to be having an episode of expressive aphasia and not look quite right.  Has since improved but per their report is still not back to baseline.  Family reports that patient had a similar episode quite a few months ago.  Etiology was unclear despite full work up.  Although it took some time patient did seem to recover.    Past Medical History  Diagnosis Date  . Dyslipidemia   . Overweight     with body mass index of 29  . HTN (hypertension)   . CAD (coronary artery disease) 2007    occlusion of mid LCX, 50-70% LAD  . Myocardial infarction 1/07    Acute non-ST-elevation myocardial infarction  . Anginal pain   . Choledocholithiasis 02/14/2012    Past Surgical History  Procedure Date  . Cardiac catheterization 03/17/2005    Ejection fraction is estimated at 55%  . Cataract extraction w/ intraocular lens  implant, bilateral ~ 2010  . Knee surgery 1980's    "took knee out and put it back in" ; left   . Cholecystectomy 02/15/2012    Procedure: LAPAROSCOPIC CHOLECYSTECTOMY WITH INTRAOPERATIVE CHOLANGIOGRAM;  Surgeon: Almond Lint, MD;  Location: [facility] OR;  Service: General;  Laterality: N/A;    Family History  Problem Relation Age of Onset  . Heart disease Father     Social History:  reports that he has never smoked. He has never used smokeless tobacco. He reports that he does not drink alcohol or use illicit drugs.  No Known Allergies  Medications:  I have reviewed the patient's current medications. Scheduled:   . ampicillin-sulbactam (UNASYN) IV  3 g Intravenous Q6H  . atorvastatin  40 mg Oral q1800  . Chlorhexidine Gluconate Cloth  6 each Topical Daily  . [COMPLETED] gadobenate dimeglumine  20 mL Intravenous Once  . heparin  subcutaneous  5,000 Units Subcutaneous Q8H  . metoprolol succinate  50 mg Oral Daily  . mupirocin ointment  1 application Nasal BID  . pantoprazole (PROTONIX) IV  40 mg Intravenous QHS  . ramipril  5 mg Oral Daily    ROS: History obtained from the patient  General ROS: negative for - chills, fatigue, fever, night sweats, weight gain or weight loss Psychological ROS: negative for - behavioral disorder, hallucinations, memory difficulties, mood swings or suicidal ideation Ophthalmic ROS: negative for - blurry vision, double vision, eye pain or loss of vision ENT ROS: negative for - epistaxis, nasal discharge, oral lesions, sore throat, tinnitus or vertigo Allergy and Immunology ROS: negative for - hives or itchy/watery eyes Hematological and Lymphatic ROS: negative for - bleeding problems, bruising or swollen lymph nodes Endocrine ROS: negative for - galactorrhea, hair pattern changes, polydipsia/polyuria or temperature intolerance Respiratory ROS: negative for - cough, hemoptysis, shortness of breath or wheezing Cardiovascular ROS: negative for - chest pain, dyspnea on exertion, edema or irregular heartbeat Gastrointestinal ROS: abdominal pain Genito-Urinary ROS: negative for - dysuria, hematuria, incontinence or urinary frequency/urgency Musculoskeletal ROS: negative for - joint swelling or muscular weakness Neurological ROS: as noted in HPI Dermatological ROS: negative for rash and skin lesion changes  Physical Examination: Blood pressure 141/73, pulse 105, temperature 99.6 F (37.6 C), temperature source Oral, resp. rate 19, height 6\' 1"  (1.854 m), weight 95.255  kg (210 lb), SpO2 94.00%.  Neurologic Examination Mental Status: Alert, oriented.  Speech fluent without evidence of aphasia.  Able to follow 3 step commands but at times requires some reinforcement.  Flat affect.  Requires multiple attempts for simple math problems such as 2X2 and 5+2. Cranial Nerves: II: Discs flat  bilaterally; Visual fields grossly normal, pupils equal, round, reactive to light and accommodation III,IV, VI: ptosis not present, extra-ocular motions intact bilaterally V,VII: smile symmetric, facial light touch sensation normal bilaterally VIII: hearing normal bilaterally IX,X: gag reflex present XI: bilateral shoulder shrug XII: midline tongue extension Motor: Right : Upper extremity   5/5    Left:     Upper extremity   5/5  Lower extremity   5/5     Lower extremity   5/5 Mild increase in LUE tone with mild tremor Sensory: Pinprick and light touch intact throughout, bilaterally Deep Tendon Reflexes: 2+ and symmetric with absent AJ's bilaterally Plantars: Right: downgoing   Left: downgoing Cerebellar: normal finger-to-nose and normal heel-to-shin test Gait: not attempted CV: pulses palpable throughout   Laboratory Studies:   Basic Metabolic Panel:  Lab 02/16/12 2130 02/15/12 0610 02/14/12 0217  NA 137 138 142  K 4.2 3.8 4.3  CL 101 103 105  CO2 28 26 27   GLUCOSE 135* 141* 144*  BUN 6 6 11   CREATININE 0.86 0.89 0.90  CALCIUM 8.5 8.7 9.6  MG -- -- --  PHOS -- -- --    Liver Function Tests:  Lab 02/16/12 0627 02/15/12 0610 02/14/12 0217  AST 36 15 26  ALT 35 11 14  ALKPHOS 55 61 60  BILITOT 2.0* 2.0* 1.0  PROT 5.5* 5.7* 6.5  ALBUMIN 2.9* 3.2* 3.9    Lab 02/14/12 0217  LIPASE 28  AMYLASE --   No results found for this basename: AMMONIA:3 in the last 168 hours  CBC:  Lab 02/16/12 0627 02/15/12 0610 02/14/12 0217  WBC 9.9 9.1 10.5  NEUTROABS -- -- 9.0*  HGB 13.9 13.6 14.5  HCT 40.9 40.9 42.8  MCV 91.9 91.9 91.3  PLT 141* 146* 162    Cardiac Enzymes:  Lab 02/14/12 0138  CKTOTAL --  CKMB --  CKMBINDEX --  TROPONINI <0.30    BNP: No components found with this basename: POCBNP:5  CBG: No results found for this basename: GLUCAP:5 in the last 168 hours  Microbiology: Results for orders placed during the hospital encounter of 02/14/12  SURGICAL  PCR SCREEN     Status: Abnormal   Collection Time   02/14/12  1:19 PM      Component Value Range Status Comment   MRSA, PCR NEGATIVE  NEGATIVE Final    Staphylococcus aureus POSITIVE (*) NEGATIVE Final     Coagulation Studies: No results found for this basename: LABPROT:5,INR:5 in the last 72 hours  Urinalysis:  Lab 02/14/12 0338  COLORURINE YELLOW  LABSPEC 1.025  PHURINE 6.0  GLUCOSEU NEGATIVE  HGBUR NEGATIVE  BILIRUBINUR NEGATIVE  KETONESUR NEGATIVE  PROTEINUR NEGATIVE  UROBILINOGEN 1.0  NITRITE NEGATIVE  LEUKOCYTESUR NEGATIVE    Lipid Panel:  No results found for this basename: chol, trig, hdl, cholhdl, vldl, ldlcalc    HgbA1C:  No results found for this basename: HGBA1C    Urine Drug Screen:     Component Value Date/Time   LABOPIA NONE DETECTED 12/02/2011 0114   COCAINSCRNUR NONE DETECTED 12/02/2011 0114   LABBENZ NONE DETECTED 12/02/2011 0114   AMPHETMU NONE DETECTED 12/02/2011 0114   THCU NONE DETECTED  12/02/2011 0114   LABBARB NONE DETECTED 12/02/2011 0114    Alcohol Level: No results found for this basename: ETH:2 in the last 168 hours   Imaging: Dg Cholangiogram Operative  02/15/2012  *RADIOLOGY REPORT*  Clinical Data:   Laparoscopic cholecystectomy with intraoperative cholangiogram  INTRAOPERATIVE CHOLANGIOGRAM  Technique:  Cholangiographic images from the C-arm fluoroscopic device were submitted for interpretation post-operatively.  Please see the procedural report for the amount of contrast and the fluoroscopy time utilized.  Comparison:  None.  Findings:  The intra and extrahepatic biliary tree are normal in caliber and smooth.  There are some subtle round filling defects in the left hepatic duct and in the common hepatic and adjacent common bile duct that are likely air bubbles.  There are no convincing duct stones.  Contrast freely empties into the duodenum.  IMPRESSION: Subtle round duct filling defects felt to be due to air bubbles. Exam otherwise  unremarkable.   Original Report Authenticated By: Amie Portland, M.D.    Dg Chest Port 1 View  02/15/2012  *RADIOLOGY REPORT*  Clinical Data: Preop for gallbladder surgery  PORTABLE CHEST - 1 VIEW  Comparison: 12/01/2011  Findings: Apical lordotic frontal view. Midline trachea.  Moderate cardiomegaly.  Tortuous descending thoracic aorta. No pleural effusion or pneumothorax.  No congestive failure.  Mild bibasilar volume loss/scarring.  IMPRESSION: Cardiomegaly, without acute disease.   Original Report Authenticated By: Jeronimo Greaves, M.D.    Mr Abd W/wo Cm/mrcp  02/16/2012   *RADIOLOGY REPORT*  Clinical Data:  Rule out common bile duct stone.  MRI ABDOMEN WITHOUT AND WITH CONTRAST (INCLUDING MRCP)  Technique:  Multiplanar multisequence MR imaging of the abdomen was performed both before and after the administration of intravenous contrast. Heavily T2-weighted images of the biliary and pancreatic ducts were obtained, and three-dimensional MRCP images were rendered by post processing.  Contrast: 20mL MULTIHANCE GADOBENATE DIMEGLUMINE 529 MG/ML IV SOLN  Comparison:  04/18/2011  Findings:  Heart size is mildly enlarged.  No pericardial or pleural effusion. Atelectasis noted within the lung bases.  No suspicious liver abnormalities identified.  On the postcontrast images the exam detail is diminished due to respiratory motion artifact.  No focal abnormalities identified.  The patient is status post cholecystectomy.  A small amount of fluid is noted within the gallbladder fossa which may be within normal limits for postsurgical change in the early postoperative period. The common bile duct is normal in caliber measuring up to 4 mm.  No evidence for choledocholithiasis or obstructing mass.  The pancreatic duct is normal in caliber.  Normal appearance of the pancreas.  The spleen is negative.  The adrenal glands appear normal.  Normal appearance of the kidneys.  There is no adenopathy noted within the upper abdomen.   Normal signal from within the bone marrow.  IMPRESSION:  1.  Status post cholecystectomy with nonspecific fluid in the gallbladder fossa. 2.  No evidence for biliary dilatation or choledocholithiasis.   Original Report Authenticated By: Signa Kell, M.D.      Assessment/Plan:  71 year old male with a change in mental status after MRCP.  Etiology unclear at this time.  Exam nonfocal.  Symptoms have improved dramatically but patient not felt to be at baseline.  Further work up indicated.  Recommendations: 1.  EEG 2. Head CT without contrast.  If unremarkable would recommend an MRI of the brain. 3. Frequent neuro checks 4. Continue ASA   Thana Farr, MD [facility] [phone] 02/16/2012, 5:39 PM

## 2012-02-17 ENCOUNTER — Inpatient Hospital Stay (HOSPITAL_COMMUNITY): Payer: Medicare Other

## 2012-02-17 LAB — CBC
MCH: 29.7 pg (ref 26.0–34.0)
MCHC: 33.3 g/dL (ref 30.0–36.0)
Platelets: 137 10*3/uL — ABNORMAL LOW (ref 150–400)
RBC: 4.37 MIL/uL (ref 4.22–5.81)
RDW: 13.5 % (ref 11.5–15.5)

## 2012-02-17 LAB — COMPREHENSIVE METABOLIC PANEL
ALT: 31 U/L (ref 0–53)
AST: 26 U/L (ref 0–37)
Albumin: 2.7 g/dL — ABNORMAL LOW (ref 3.5–5.2)
CO2: 24 mEq/L (ref 19–32)
Calcium: 8.4 mg/dL (ref 8.4–10.5)
Creatinine, Ser: 0.86 mg/dL (ref 0.50–1.35)
Sodium: 133 mEq/L — ABNORMAL LOW (ref 135–145)
Total Protein: 5.5 g/dL — ABNORMAL LOW (ref 6.0–8.3)

## 2012-02-17 MED ORDER — LEVOFLOXACIN IN D5W 750 MG/150ML IV SOLN
750.0000 mg | INTRAVENOUS | Status: DC
  Administered 2012-02-17: 750 mg via INTRAVENOUS
  Filled 2012-02-17 (×2): qty 150

## 2012-02-17 MED ORDER — ACETAMINOPHEN 325 MG PO TABS
650.0000 mg | ORAL_TABLET | Freq: Four times a day (QID) | ORAL | Status: DC | PRN
  Filled 2012-02-17: qty 2

## 2012-02-17 MED ORDER — PANTOPRAZOLE SODIUM 40 MG PO TBEC
40.0000 mg | DELAYED_RELEASE_TABLET | Freq: Every day | ORAL | Status: DC
  Filled 2012-02-17: qty 1

## 2012-02-17 NOTE — Progress Notes (Signed)
Subjective: Patient much more animated and interactive today with conversation.  CT of the head performed and shows no change from previous imaging.    Objective: Current vital signs: BP 136/72  Pulse 82  Temp 98.2 F (36.8 C) (Oral)  Resp 20  Ht 6\' 1"  (1.854 m)  Wt 95.255 kg (210 lb)  BMI 27.71 kg/m2  SpO2 95% Vital signs in last 24 hours: Temp:  [98.2 F (36.8 C)-101.3 F (38.5 C)] 98.2 F (36.8 C) (12/07 1649) Pulse Rate:  [82-101] 82  (12/07 1649) Resp:  [15-20] 20  (12/07 1649) BP: (119-137)/(51-78) 136/72 mmHg (12/07 1649) SpO2:  [92 %-96 %] 95 % (12/07 1649)  Intake/Output from previous day: 12/06 0701 - 12/07 0700 In: 801 [I.V.:601; IV Piggyback:200] Out: 200 [Urine:200] Intake/Output this shift: Total I/O In: 240 [P.O.:240] Out: 200 [Urine:200] Nutritional status: General  Neurologic Exam: Mental Status:  Alert, oriented. Speech fluent without evidence of aphasia. Able to follow 3 step commands.  Easily answered math questions that he was unable to answer easily yesterday .  Cranial Nerves:  II: Discs flat bilaterally; Visual fields grossly normal, pupils equal, round, reactive to light and accommodation  III,IV, VI: ptosis not present, extra-ocular motions intact bilaterally  V,VII: smile symmetric, facial light touch sensation normal bilaterally  VIII: hearing normal bilaterally  IX,X: gag reflex present  XI: bilateral shoulder shrug  XII: midline tongue extension  Motor:  Right : Upper extremity 5/5            Left: Upper extremity 5/5   Lower extremity 5/5         Lower extremity 5/5  Mildly increased tone in the LUE with mild tremor noted.    Sensory: Pinprick and light touch intact throughout, bilaterally  Deep Tendon Reflexes: 2+ and symmetric with absent AJ's bilaterally  Plantars:  Right: downgoing      Left: downgoing  Cerebellar:  normal finger-to-nose and normal heel-to-shin test    Lab Results: Basic Metabolic Panel:  Lab 02/17/12 4098  02/16/12 0627 02/15/12 0610 02/14/12 0217  NA 133* 137 138 142  K 3.8 4.2 3.8 4.3  CL 99 101 103 105  CO2 24 28 26 27   GLUCOSE 111* 135* 141* 144*  BUN 9 6 6 11   CREATININE 0.86 0.86 0.89 0.90  CALCIUM 8.4 8.5 8.7 --  MG -- -- -- --  PHOS -- -- -- --    Liver Function Tests:  Lab 02/17/12 0655 02/16/12 0627 02/15/12 0610 02/14/12 0217  AST 26 36 15 26  ALT 31 35 11 14  ALKPHOS 50 55 61 60  BILITOT 2.2* 2.0* 2.0* 1.0  PROT 5.5* 5.5* 5.7* 6.5  ALBUMIN 2.7* 2.9* 3.2* 3.9    Lab 02/14/12 0217  LIPASE 28  AMYLASE --   No results found for this basename: AMMONIA:3 in the last 168 hours  CBC:  Lab 02/17/12 0655 02/16/12 0627 02/15/12 0610 02/14/12 0217  WBC 9.8 9.9 9.1 10.5  NEUTROABS -- -- -- 9.0*  HGB 13.0 13.9 13.6 14.5  HCT 39.0 40.9 40.9 42.8  MCV 89.2 91.9 91.9 91.3  PLT 137* 141* 146* 162    Cardiac Enzymes:  Lab 02/14/12 0138  CKTOTAL --  CKMB --  CKMBINDEX --  TROPONINI <0.30    Lipid Panel: No results found for this basename: CHOL:5,TRIG:5,HDL:5,CHOLHDL:5,VLDL:5,LDLCALC:5 in the last 168 hours  CBG: No results found for this basename: GLUCAP:5 in the last 168 hours  Microbiology: Results for orders placed during the hospital  encounter of 02/14/12  SURGICAL PCR SCREEN     Status: Abnormal   Collection Time   02/14/12  1:19 PM      Component Value Range Status Comment   MRSA, PCR NEGATIVE  NEGATIVE Final    Staphylococcus aureus POSITIVE (*) NEGATIVE Final     Coagulation Studies: No results found for this basename: LABPROT:5,INR:5 in the last 72 hours  Imaging: Dg Chest 2 View  02/17/2012  *RADIOLOGY REPORT*  Clinical Data: Fever.  CHEST - 2 VIEW  Comparison: 02/15/2012.  Findings: The heart is enlarged but stable.  There are prominent but unchanged mediastinal and hilar contours.  Low lung volumes with vascular crowding and bibasilar atelectasis.  No definite pleural effusions.  The bony thorax is intact.  Remote healed right clavicle fracture  is again demonstrated.  IMPRESSION: Stable cardiac enlargement and mild central vascular congestion. Low lung volumes with vascular crowding and bibasilar atelectasis. Small effusions are possible.   Original Report Authenticated By: Rudie Meyer, M.D.    Ct Head Wo Contrast  02/16/2012  *RADIOLOGY REPORT*  Clinical Data: Altered mental status.  CT HEAD WITHOUT CONTRAST  Technique:  Contiguous axial images were obtained from the base of the skull through the vertex without contrast.  Comparison: Brain MR dated 12/06/2011 and head CT dated 12/01/2011.  Findings: Stable minimally enlarged ventricles and subarachnoid spaces and minimal patchy white matter low density in both cerebral hemispheres.  No intracranial hemorrhage, mass lesion or CT evidence of acute infarction.  Unremarkable bones.  Marked left maxillary sinus mucosal thickening is again demonstrated with soft tissue density extending into the adjacent nasal cavity through a probable surgical defect in the medial wall of the left maxillary sinus.  This has a more mass-like configuration on the current examination.  IMPRESSION:  1.  No acute abnormality. 2. Stable minimal atrophy and minimal chronic small vessel white matter ischemic changes in both cerebral hemispheres. 3.  Continued marked chronic left maxillary sinusitis with progressive extension into the adjacent nasal cavity.   Original Report Authenticated By: Beckie Salts, M.D.    Mr Abd W/wo Cm/mrcp  02/16/2012   *RADIOLOGY REPORT*  Clinical Data:  Rule out common bile duct stone.  MRI ABDOMEN WITHOUT AND WITH CONTRAST (INCLUDING MRCP)  Technique:  Multiplanar multisequence MR imaging of the abdomen was performed both before and after the administration of intravenous contrast. Heavily T2-weighted images of the biliary and pancreatic ducts were obtained, and three-dimensional MRCP images were rendered by post processing.  Contrast: 20mL MULTIHANCE GADOBENATE DIMEGLUMINE 529 MG/ML IV SOLN   Comparison:  04/18/2011  Findings:  Heart size is mildly enlarged.  No pericardial or pleural effusion. Atelectasis noted within the lung bases.  No suspicious liver abnormalities identified.  On the postcontrast images the exam detail is diminished due to respiratory motion artifact.  No focal abnormalities identified.  The patient is status post cholecystectomy.  A small amount of fluid is noted within the gallbladder fossa which may be within normal limits for postsurgical change in the early postoperative period. The common bile duct is normal in caliber measuring up to 4 mm.  No evidence for choledocholithiasis or obstructing mass.  The pancreatic duct is normal in caliber.  Normal appearance of the pancreas.  The spleen is negative.  The adrenal glands appear normal.  Normal appearance of the kidneys.  There is no adenopathy noted within the upper abdomen.  Normal signal from within the bone marrow.  IMPRESSION:  1.  Status post cholecystectomy  with nonspecific fluid in the gallbladder fossa. 2.  No evidence for biliary dilatation or choledocholithiasis.   Original Report Authenticated By: Signa Kell, M.D.     Medications:  I have reviewed the patient's current medications. Scheduled:   . ampicillin-sulbactam (UNASYN) IV  3 g Intravenous Q6H  . atorvastatin  40 mg Oral q1800  . Chlorhexidine Gluconate Cloth  6 each Topical Daily  . heparin subcutaneous  5,000 Units Subcutaneous Q8H  . metoprolol succinate  50 mg Oral Daily  . mupirocin ointment  1 application Nasal BID  . pantoprazole  40 mg Oral Q1200  . ramipril  5 mg Oral Daily  . [DISCONTINUED] pantoprazole (PROTONIX) IV  40 mg Intravenous QHS    Assessment/Plan:  Patient Active Hospital Problem List:  Altered mental status (02/16/2012)   Assessment: Mental status improved.  Unclear etiology.  Imaging unremarkable.  EEG pending   Plan:  1.  Will follow up EEG result.  With improvements seen, no further work up recommended at this  time.  May continue follow up with neurology as an outpatient.       LOS: 3 days   Thana Farr, MD [facility] [phone] 02/17/2012  5:22 PM

## 2012-02-17 NOTE — Progress Notes (Signed)
Pt noted with red rash over face ,bil arms and legs dr Donell Beers notified orders received will monitor

## 2012-02-17 NOTE — Progress Notes (Signed)
Agree with above.  Cognition impaired.

## 2012-02-17 NOTE — Progress Notes (Signed)
Routine EEG completed.  

## 2012-02-17 NOTE — Progress Notes (Signed)
2 Days Post-Op   Assessment: s/p Procedure(s): LAPAROSCOPIC CHOLECYSTECTOMY WITH INTRAOPERATIVE CHOLANGIOGRAM Patient Active Problem List  Diagnosis  . Myocardial infarction  . Dyslipidemia  . HTN (hypertension)  . CAD (coronary artery disease)  . Dehydration  . Delirium  . Fever  . Weakness generalized  . Acute cholecystitis  . Altered mental status    Improved from GB, slt eleation of Bili, but neg MRCP Mental status improved  Plan: Will slow IVF. Await neuro f/u to see if any additional w/o needed. From GB standpoint could go home and be followed as op.  Subjective: Feels better, no abd pain, says his thinking is more clear today. Tolerating diet but not very hungry yet  Objective: Vital signs in last 24 hours: Temp:  [98.8 F (37.1 C)-99.6 F (37.6 C)] 98.8 F (37.1 C) (12/07 0533) Pulse Rate:  [83-105] 83  (12/07 0533) Resp:  [15-19] 18  (12/07 0533) BP: (119-141)/(51-78) 119/51 mmHg (12/07 0533) SpO2:  [92 %-95 %] 92 % (12/07 0533)   Intake/Output from previous day: 12/06 0701 - 12/07 0700 In: 801 [I.V.:601; IV Piggyback:200] Out: 200 [Urine:200] Intake/Output this shift:     General appearance: alert, cooperative and no distress Resp: clear to auscultation bilaterally GI: soft, non-tender; bowel sounds normal; no masses,  no organomegaly  Incision: healing well  Lab Results:   [facility] 02/17/12 0655 02/16/12 0627  WBC 9.8 9.9  HGB 13.0 13.9  HCT 39.0 40.9  PLT 137* 141*   BMET  Basename 02/17/12 0655 02/16/12 0627  NA 133* 137  K 3.8 4.2  CL 99 101  CO2 24 28  GLUCOSE 111* 135*  BUN 9 6  CREATININE 0.86 0.86  CALCIUM 8.4 8.5   PT/INR No results found for this basename: LABPROT:2,INR:2 in the last 72 hours ABG No results found for this basename: PHART:2,PCO2:2,PO2:2,HCO3:2 in the last 72 hours  MEDS, Scheduled    . ampicillin-sulbactam (UNASYN) IV  3 g Intravenous Q6H  . atorvastatin  40 mg Oral q1800  . Chlorhexidine Gluconate  Cloth  6 each Topical Daily  . [COMPLETED] gadobenate dimeglumine  20 mL Intravenous Once  . heparin subcutaneous  5,000 Units Subcutaneous Q8H  . metoprolol succinate  50 mg Oral Daily  . mupirocin ointment  1 application Nasal BID  . pantoprazole (PROTONIX) IV  40 mg Intravenous QHS  . ramipril  5 mg Oral Daily    Studies/Results: Dg Cholangiogram Operative  02/15/2012  *RADIOLOGY REPORT*  Clinical Data:   Laparoscopic cholecystectomy with intraoperative cholangiogram  INTRAOPERATIVE CHOLANGIOGRAM  Technique:  Cholangiographic images from the C-arm fluoroscopic device were submitted for interpretation post-operatively.  Please see the procedural report for the amount of contrast and the fluoroscopy time utilized.  Comparison:  None.  Findings:  The intra and extrahepatic biliary tree are normal in caliber and smooth.  There are some subtle round filling defects in the left hepatic duct and in the common hepatic and adjacent common bile duct that are likely air bubbles.  There are no convincing duct stones.  Contrast freely empties into the duodenum.  IMPRESSION: Subtle round duct filling defects felt to be due to air bubbles. Exam otherwise unremarkable.   Original Report Authenticated By: Amie Portland, M.D.    Ct Head Wo Contrast  02/16/2012  *RADIOLOGY REPORT*  Clinical Data: Altered mental status.  CT HEAD WITHOUT CONTRAST  Technique:  Contiguous axial images were obtained from the base of the skull through the vertex without contrast.  Comparison: Brain MR  dated 12/06/2011 and head CT dated 12/01/2011.  Findings: Stable minimally enlarged ventricles and subarachnoid spaces and minimal patchy white matter low density in both cerebral hemispheres.  No intracranial hemorrhage, mass lesion or CT evidence of acute infarction.  Unremarkable bones.  Marked left maxillary sinus mucosal thickening is again demonstrated with soft tissue density extending into the adjacent nasal cavity through a probable  surgical defect in the medial wall of the left maxillary sinus.  This has a more mass-like configuration on the current examination.  IMPRESSION:  1.  No acute abnormality. 2. Stable minimal atrophy and minimal chronic small vessel white matter ischemic changes in both cerebral hemispheres. 3.  Continued marked chronic left maxillary sinusitis with progressive extension into the adjacent nasal cavity.   Original Report Authenticated By: Beckie Salts, M.D.    Mr Abd W/wo Cm/mrcp  02/16/2012   *RADIOLOGY REPORT*  Clinical Data:  Rule out common bile duct stone.  MRI ABDOMEN WITHOUT AND WITH CONTRAST (INCLUDING MRCP)  Technique:  Multiplanar multisequence MR imaging of the abdomen was performed both before and after the administration of intravenous contrast. Heavily T2-weighted images of the biliary and pancreatic ducts were obtained, and three-dimensional MRCP images were rendered by post processing.  Contrast: 20mL MULTIHANCE GADOBENATE DIMEGLUMINE 529 MG/ML IV SOLN  Comparison:  04/18/2011  Findings:  Heart size is mildly enlarged.  No pericardial or pleural effusion. Atelectasis noted within the lung bases.  No suspicious liver abnormalities identified.  On the postcontrast images the exam detail is diminished due to respiratory motion artifact.  No focal abnormalities identified.  The patient is status post cholecystectomy.  A small amount of fluid is noted within the gallbladder fossa which may be within normal limits for postsurgical change in the early postoperative period. The common bile duct is normal in caliber measuring up to 4 mm.  No evidence for choledocholithiasis or obstructing mass.  The pancreatic duct is normal in caliber.  Normal appearance of the pancreas.  The spleen is negative.  The adrenal glands appear normal.  Normal appearance of the kidneys.  There is no adenopathy noted within the upper abdomen.  Normal signal from within the bone marrow.  IMPRESSION:  1.  Status post cholecystectomy  with nonspecific fluid in the gallbladder fossa. 2.  No evidence for biliary dilatation or choledocholithiasis.   Original Report Authenticated By: Signa Kell, M.D.       LOS: 3 days     Currie Paris, MD, [facility], [place] [phone]   02/17/2012 11:54 AM

## 2012-02-18 LAB — URINALYSIS, ROUTINE W REFLEX MICROSCOPIC
Bilirubin Urine: NEGATIVE
Glucose, UA: NEGATIVE mg/dL
Ketones, ur: 15 mg/dL — AB
Leukocytes, UA: NEGATIVE
Nitrite: NEGATIVE
Specific Gravity, Urine: 1.018 (ref 1.005–1.030)
pH: 7 (ref 5.0–8.0)

## 2012-02-18 LAB — HEPATIC FUNCTION PANEL
ALT: 24 U/L (ref 0–53)
AST: 18 U/L (ref 0–37)
Albumin: 2.7 g/dL — ABNORMAL LOW (ref 3.5–5.2)
Bilirubin, Direct: 0.3 mg/dL (ref 0.0–0.3)
Total Bilirubin: 1.4 mg/dL — ABNORMAL HIGH (ref 0.3–1.2)

## 2012-02-18 MED ORDER — OXYCODONE-ACETAMINOPHEN 5-325 MG PO TABS: 1.0000 | ORAL_TABLET | ORAL | Status: DC | PRN

## 2012-02-18 MED ORDER — AMOXICILLIN-POT CLAVULANATE 875-125 MG PO TABS: 1.0000 | ORAL_TABLET | Freq: Two times a day (BID) | ORAL | Status: DC

## 2012-02-18 NOTE — Procedures (Signed)
ELECTROENCEPHALOGRAM REPORT   Patient: [name]       Room #: 0Q65 EEG No. ID: [id number] Age: 71 y.o.        Sex: male Referring Physician:  Donell Beers Report Date:  02/18/2012        Interpreting Physician: Thana Farr D  History: [name] is an 71 y.o. male with altered mental status  Medications:  Scheduled:   . atorvastatin  40 mg Oral q1800  . Chlorhexidine Gluconate Cloth  6 each Topical Daily  . heparin subcutaneous  5,000 Units Subcutaneous Q8H  . levofloxacin (LEVAQUIN) IV  750 mg Intravenous Q24H  . metoprolol succinate  50 mg Oral Daily  . mupirocin ointment  1 application Nasal BID  . pantoprazole  40 mg Oral Q1200  . ramipril  5 mg Oral Daily  . [DISCONTINUED] ampicillin-sulbactam (UNASYN) IV  3 g Intravenous Q6H  . [DISCONTINUED] pantoprazole (PROTONIX) IV  40 mg Intravenous QHS    Conditions of Recording:  This is a 16 channel EEG carried out with the patient in the awake state.  Description:  The waking background activity consists of a low voltage, symmetrical, fairly well organized, 6-7 Hz theta activity, seen from the parieto-occipital and posterior temporal regions at best.  There are other times during the tracing when an even slow theta frequency or delta rhythm is noted as the posterior background rhythm.  There is a relative absence of faster frequencies anteriorly with mostly theta rhythms noted in that region as well.  A mixture of theta and alpha rhythms, with theta rhythms being most predominant, are seen from the central and temporal regions. The patient does not drowse or sleep. Hyperventilation was not performed.  Intermittent photic stimulation was performed but failed to illicit any change in the tracing.   IMPRESSION: This is an abnormal EEG secondary to general background slowing.  This finding may be seen with a diffuse disturbance that is etiologically nonspecific, but may include a metabolic encephalopathy, among other  possibilities.  No epileptiform activity was noted.    Comment:  An EEG with the patient sleep deprived to elicit drowse and light sleep may be desirable to further elicit a possible seizure disorder.     Thana Farr, MD [facility] [phone] 02/18/2012, 7:35 AM

## 2012-02-18 NOTE — Progress Notes (Addendum)
3 Days Post-Op   Assessment: s/p Procedure(s): LAPAROSCOPIC CHOLECYSTECTOMY WITH INTRAOPERATIVE CHOLANGIOGRAM Patient Active Problem List  Diagnosis  . Myocardial infarction  . Dyslipidemia  . HTN (hypertension)  . CAD (coronary artery disease)  . Dehydration  . Delirium  . Fever  . Weakness generalized  . Acute cholecystitis  . Altered mental status    Continues to improve; low-grade fever yesterday likely secondary to mild atelectasis; urinalysis pending, liver function panel pending. No status appears resolved and no further neurological workup recommended by the neurology consultant, with plans for postdischarge followup  Plan: await results of urinalysis and liver functions. I think he'll be able to go home later today.  LFT's OK. Discussed with him and family and will discharge today  Subjective: He feels good today and wants to go home. He is not having any abdominal pain he is voiding okay. He is not having nausea. He is tolerating diet. Has been ambulating. He feels that his mental status is back to normal.  Objective: Vital signs in last 24 hours: Temp:  [98.2 F (36.8 C)-101.3 F (38.5 C)] 98.6 F (37 C) (12/08 0601) Pulse Rate:  [65-86] 70  (12/08 0601) Resp:  [18-20] 20  (12/08 0601) BP: (103-153)/(62-86) 103/68 mmHg (12/08 0601) SpO2:  [95 %-98 %] 98 % (12/08 0601)   Intake/Output from previous day: 12/07 0701 - 12/08 0700 In: 360 [P.O.:360] Out: 1000 [Urine:1000] Intake/Output this shift:     General appearance: alert, cooperative and no distress Resp: clear to auscultation bilaterally Cardio: regular rate and rhythm, S1, S2 normal, no murmur, click, rub or gallop GI: soft, non-tender; bowel sounds normal; no masses,  no organomegaly  Incision: healing well  Lab Results:   [facility] 02/17/12 0655 02/16/12 0627  WBC 9.8 9.9  HGB 13.0 13.9  HCT 39.0 40.9  PLT 137* 141*   BMET  Basename 02/17/12 0655 02/16/12 0627  NA 133* 137  K 3.8 4.2   CL 99 101  CO2 24 28  GLUCOSE 111* 135*  BUN 9 6  CREATININE 0.86 0.86  CALCIUM 8.4 8.5   PT/INR No results found for this basename: LABPROT:2,INR:2 in the last 72 hours ABG No results found for this basename: PHART:2,PCO2:2,PO2:2,HCO3:2 in the last 72 hours  MEDS, Scheduled    . atorvastatin  40 mg Oral q1800  . Chlorhexidine Gluconate Cloth  6 each Topical Daily  . heparin subcutaneous  5,000 Units Subcutaneous Q8H  . levofloxacin (LEVAQUIN) IV  750 mg Intravenous Q24H  . metoprolol succinate  50 mg Oral Daily  . mupirocin ointment  1 application Nasal BID  . pantoprazole  40 mg Oral Q1200  . ramipril  5 mg Oral Daily  . [DISCONTINUED] ampicillin-sulbactam (UNASYN) IV  3 g Intravenous Q6H  . [DISCONTINUED] pantoprazole (PROTONIX) IV  40 mg Intravenous QHS    Studies/Results: Dg Chest 2 View  02/17/2012  *RADIOLOGY REPORT*  Clinical Data: Fever.  CHEST - 2 VIEW  Comparison: 02/15/2012.  Findings: The heart is enlarged but stable.  There are prominent but unchanged mediastinal and hilar contours.  Low lung volumes with vascular crowding and bibasilar atelectasis.  No definite pleural effusions.  The bony thorax is intact.  Remote healed right clavicle fracture is again demonstrated.  IMPRESSION: Stable cardiac enlargement and mild central vascular congestion. Low lung volumes with vascular crowding and bibasilar atelectasis. Small effusions are possible.   Original Report Authenticated By: Rudie Meyer, M.D.    Ct Head Wo Contrast  02/16/2012  *RADIOLOGY  REPORT*  Clinical Data: Altered mental status.  CT HEAD WITHOUT CONTRAST  Technique:  Contiguous axial images were obtained from the base of the skull through the vertex without contrast.  Comparison: Brain MR dated 12/06/2011 and head CT dated 12/01/2011.  Findings: Stable minimally enlarged ventricles and subarachnoid spaces and minimal patchy white matter low density in both cerebral hemispheres.  No intracranial hemorrhage, mass  lesion or CT evidence of acute infarction.  Unremarkable bones.  Marked left maxillary sinus mucosal thickening is again demonstrated with soft tissue density extending into the adjacent nasal cavity through a probable surgical defect in the medial wall of the left maxillary sinus.  This has a more mass-like configuration on the current examination.  IMPRESSION:  1.  No acute abnormality. 2. Stable minimal atrophy and minimal chronic small vessel white matter ischemic changes in both cerebral hemispheres. 3.  Continued marked chronic left maxillary sinusitis with progressive extension into the adjacent nasal cavity.   Original Report Authenticated By: Beckie Salts, M.D.    Mr Abd W/wo Cm/mrcp  02/16/2012   *RADIOLOGY REPORT*  Clinical Data:  Rule out common bile duct stone.  MRI ABDOMEN WITHOUT AND WITH CONTRAST (INCLUDING MRCP)  Technique:  Multiplanar multisequence MR imaging of the abdomen was performed both before and after the administration of intravenous contrast. Heavily T2-weighted images of the biliary and pancreatic ducts were obtained, and three-dimensional MRCP images were rendered by post processing.  Contrast: 20mL MULTIHANCE GADOBENATE DIMEGLUMINE 529 MG/ML IV SOLN  Comparison:  04/18/2011  Findings:  Heart size is mildly enlarged.  No pericardial or pleural effusion. Atelectasis noted within the lung bases.  No suspicious liver abnormalities identified.  On the postcontrast images the exam detail is diminished due to respiratory motion artifact.  No focal abnormalities identified.  The patient is status post cholecystectomy.  A small amount of fluid is noted within the gallbladder fossa which may be within normal limits for postsurgical change in the early postoperative period. The common bile duct is normal in caliber measuring up to 4 mm.  No evidence for choledocholithiasis or obstructing mass.  The pancreatic duct is normal in caliber.  Normal appearance of the pancreas.  The spleen is  negative.  The adrenal glands appear normal.  Normal appearance of the kidneys.  There is no adenopathy noted within the upper abdomen.  Normal signal from within the bone marrow.  IMPRESSION:  1.  Status post cholecystectomy with nonspecific fluid in the gallbladder fossa. 2.  No evidence for biliary dilatation or choledocholithiasis.   Original Report Authenticated By: Signa Kell, M.D.       LOS: 4 days     Currie Paris, MD, [facility], [place] [phone]   02/18/2012 8:28 AM

## 2012-02-19 ENCOUNTER — Telehealth (INDEPENDENT_AMBULATORY_CARE_PROVIDER_SITE_OTHER): Payer: Self-pay | Admitting: General Surgery

## 2012-02-19 NOTE — Telephone Encounter (Signed)
Left message on machine for patient with appt date/time. Patient to call back if this is not a good date/time.

## 2012-02-19 NOTE — Telephone Encounter (Signed)
Message copied by Liliana Cline on Mon Feb 19, 2012  8:57 AM ------      Message from: Joanette Gula      Created: Mon Feb 19, 2012  8:12 AM                   ----- Message -----         From: Currie Paris, MD         Sent: 02/18/2012  11:03 AM           To: Lenise Herald, CMA, Ivory Broad, RN, #            Discharged Sunday, needs F/U in one week post op lap chole

## 2012-02-23 LAB — CULTURE, BLOOD (ROUTINE X 2): Culture: NO GROWTH

## 2012-02-23 NOTE — Discharge Summary (Signed)
Patient ID: [name] MRN: [id number] DOB/AGE: Apr 21, 1940 71 y.o.  Admit date: 02/14/2012 Discharge date: 02/23/2012  Procedures: lap chole with IOC  Consults: neurology  Reason for Admission: This is a 71 yo white male who over the last couple of months has had 2 episodes of epigastric abdominal pain that woke him up at night. These went away within an hour or so. Last night around 2200, he developed epigastric abdominal pain that was the worst of these episodes. It was sever in nature. He denies any nausea or vomiting until he got here to [facility] where he had one episode of emesis and none since then. His pain went away and has not come back. He had a CT scan initially that revealed probable acute cholecystitis and ultrasound was recommended. This was obtained and revealed gallbladder wall thickening and pericholecystic fluid; however the patient has normal labs and no abdominal pain. He does however have an elevated neutrophil count at 86. We have been asked to see the patient for further evaluation.   Admission Diagnoses:  1. Acute cholecystitis  2. CAD, h/o MI in 2007  3. Hypercholesterolemia  4. HTN  Hospital Course: The patient was admitted and placed on IV antibiotic therapy. He was taken to the operating room the following day where he was found to have acute cholecystitis. The gallbladder was removed without difficulty. The IOC appeared normal however was read as a possible filling defect consistent with an air bubble but cannot exclude a small stone.Marland Kitchen His bilirubin did elevate after surgery to 2.0. Therefore, we ordered an MRCP on postoperative day one to rule out a common bile duct stone. This was negative. After the patient returned from his MRI he began having some H. difficulties and delayed mental processing. Sr. stated this was very similar to his prior episode he had had several months earlier. Neurology was consulted. They ordered imaging of the head which was negative. He  should return to baseline and was able to be discharged home on postoperative day 2 after his diet was advanced.  Discharge Diagnoses:  Principal Problem:  *Acute cholecystitis Active Problems:  Altered mental status  status post laparoscopic cholecystectomy with intraoperative cholangiogram  Discharge Medications:   Medication List     As of 02/23/2012  9:54 AM    STOP taking these medications         alum & mag hydroxide-simeth 200-200-20 MG/5ML suspension   Commonly known as: MAALOX/MYLANTA      carbidopa-levodopa 25-100 MG per tablet   Commonly known as: SINEMET IR      zolpidem 5 MG tablet   Commonly known as: AMBIEN      TAKE these medications         amoxicillin-clavulanate 875-125 MG per tablet   Commonly known as: AUGMENTIN   Take 1 tablet by mouth 2 (two) times daily.      aspirin EC 81 MG tablet   Take 81 mg by mouth daily.      metoprolol succinate 50 MG 24 hr tablet   Commonly known as: TOPROL-XL   Take 1 tablet (50 mg total) by mouth daily.      ramipril 10 MG capsule   Commonly known as: ALTACE   Take 1 capsule (10 mg total) by mouth daily.      simvastatin 80 MG tablet   Commonly known as: ZOCOR   Take 1 tablet (80 mg total) by mouth at bedtime.        Discharge Instructions:  Follow-up Information    Follow up with [facility]. Schedule an appointment as soon as possible for a visit in 1 week.   Contact information:   [address]   [place] [address] [phone]          Signed: Letha Cape 02/23/2012, 9:54 AM

## 2012-02-27 ENCOUNTER — Encounter (INDEPENDENT_AMBULATORY_CARE_PROVIDER_SITE_OTHER): Payer: Self-pay | Admitting: Surgery

## 2012-02-27 ENCOUNTER — Ambulatory Visit (INDEPENDENT_AMBULATORY_CARE_PROVIDER_SITE_OTHER): Payer: Medicare Other | Admitting: Surgery

## 2012-02-27 VITALS — BP 132/64 | HR 92 | Temp 97.2°F | Resp 16 | Ht 74.0 in | Wt 200.0 lb

## 2012-02-27 DIAGNOSIS — Z09 Encounter for follow-up examination after completed treatment for conditions other than malignant neoplasm: Secondary | ICD-10-CM

## 2012-02-27 MED ORDER — MAGIC MOUTHWASH: 5.0000 mL | Freq: Three times a day (TID) | ORAL | Status: DC

## 2012-02-27 MED ORDER — FLUCONAZOLE 200 MG PO TABS: 200.0000 mg | ORAL_TABLET | Freq: Every day | ORAL | Status: DC

## 2012-02-27 NOTE — Patient Instructions (Signed)
Take the anti fungal pills for three days and use the mouthwash three times daily. Let us know if the coating of the tongue does not improve

## 2012-02-27 NOTE — Progress Notes (Signed)
NAME: [name]       DOB: March 08, 1941           DATE: 02/27/2012       ZOX:[id number]   CC: Postop laparoscopic cholecystectomy  Impression:  The patient appears to be doing well, with improvement in his symptoms. Probable oral thrush  Plan:  He may resume full activity and regular diet. He  will followup with [place] on a p.r.n. basis. I did tell him that he may still have some foods that cause indigestion and ask him to call us if there are any questions, problems or concerns. I am going to give him some Diflucan to help with the oral thrush and have him let us know if he is not better in a few days  HPI:  This patient underwent a laparoscopic cholecystectomy with operative cholangiogram on 02/15/12, for acute cholecystitis. He feels he is recovering slower than anticipated, not eating well, sore right leg. No incisional problems and no abd pain PE:  VS: BP 132/64  Pulse 92  Temp 97.2 F (36.2 C) (Temporal)  Resp 16  Ht 6\' 2"  (1.88 m)  Wt 200 lb (90.719 kg)  BMI 25.68 kg/m2  General: The patient is alert and appears comfortable, NAD.  Pharynx: coated, lookes monilial Abdomen: Soft and benign. The incisions are healing nicely. There are no apparent problems.  Data reviewed: WJX:[id number]: The intra and extrahepatic biliary tree are normal in  caliber and smooth. There are some subtle round filling defects in  the left hepatic duct and in the common hepatic and adjacent common  bile duct that are likely air bubbles. There are no convincing  duct stones. Contrast freely empties into the duodenum.  IMPRESSION:  Subtle round duct filling defects felt to be due to air bubbles.  Exam otherwise unremarkable.  Original Report Authenticated By: Amie Portland, M.D.  Pathology:  Diagnosis Gallbladder - CHRONIC ACTIVE CHOLECYSTITIS. - CHOLELITHIASIS. - INCIDENTAL BENIGN LIVER.

## 2012-03-20 DIAGNOSIS — M5430 Sciatica, unspecified side: Secondary | ICD-10-CM | POA: Diagnosis not present

## 2012-03-20 DIAGNOSIS — M25559 Pain in unspecified hip: Secondary | ICD-10-CM | POA: Diagnosis not present

## 2012-04-03 DIAGNOSIS — M25569 Pain in unspecified knee: Secondary | ICD-10-CM | POA: Diagnosis not present

## 2012-04-03 DIAGNOSIS — M5430 Sciatica, unspecified side: Secondary | ICD-10-CM | POA: Diagnosis not present

## 2012-04-30 DIAGNOSIS — H35059 Retinal neovascularization, unspecified, unspecified eye: Secondary | ICD-10-CM | POA: Diagnosis not present

## 2012-04-30 DIAGNOSIS — H353290 Exudative age-related macular degeneration, unspecified eye, stage unspecified: Secondary | ICD-10-CM | POA: Diagnosis not present

## 2012-05-01 DIAGNOSIS — M25569 Pain in unspecified knee: Secondary | ICD-10-CM | POA: Diagnosis not present

## 2012-06-11 DIAGNOSIS — H3530 Unspecified macular degeneration: Secondary | ICD-10-CM | POA: Diagnosis not present

## 2012-06-11 DIAGNOSIS — H35059 Retinal neovascularization, unspecified, unspecified eye: Secondary | ICD-10-CM | POA: Diagnosis not present

## 2012-06-17 DIAGNOSIS — I2510 Atherosclerotic heart disease of native coronary artery without angina pectoris: Secondary | ICD-10-CM | POA: Diagnosis not present

## 2012-06-17 DIAGNOSIS — I10 Essential (primary) hypertension: Secondary | ICD-10-CM | POA: Diagnosis not present

## 2012-06-17 DIAGNOSIS — E785 Hyperlipidemia, unspecified: Secondary | ICD-10-CM | POA: Diagnosis not present

## 2012-06-17 DIAGNOSIS — R7301 Impaired fasting glucose: Secondary | ICD-10-CM | POA: Diagnosis not present

## 2012-06-17 DIAGNOSIS — Z125 Encounter for screening for malignant neoplasm of prostate: Secondary | ICD-10-CM | POA: Diagnosis not present

## 2012-06-24 DIAGNOSIS — Z6829 Body mass index (BMI) 29.0-29.9, adult: Secondary | ICD-10-CM | POA: Diagnosis not present

## 2012-06-24 DIAGNOSIS — I10 Essential (primary) hypertension: Secondary | ICD-10-CM | POA: Diagnosis not present

## 2012-06-24 DIAGNOSIS — Z0000 Encounter for general adult medical examination without abnormal findings: Secondary | ICD-10-CM | POA: Diagnosis not present

## 2012-06-24 DIAGNOSIS — Z125 Encounter for screening for malignant neoplasm of prostate: Secondary | ICD-10-CM | POA: Diagnosis not present

## 2012-06-24 DIAGNOSIS — I872 Venous insufficiency (chronic) (peripheral): Secondary | ICD-10-CM | POA: Diagnosis not present

## 2012-06-24 DIAGNOSIS — I2510 Atherosclerotic heart disease of native coronary artery without angina pectoris: Secondary | ICD-10-CM | POA: Diagnosis not present

## 2012-06-24 DIAGNOSIS — R7301 Impaired fasting glucose: Secondary | ICD-10-CM | POA: Diagnosis not present

## 2012-06-24 DIAGNOSIS — E46 Unspecified protein-calorie malnutrition: Secondary | ICD-10-CM | POA: Diagnosis not present

## 2012-06-24 DIAGNOSIS — E785 Hyperlipidemia, unspecified: Secondary | ICD-10-CM | POA: Diagnosis not present

## 2012-06-25 DIAGNOSIS — Z1212 Encounter for screening for malignant neoplasm of rectum: Secondary | ICD-10-CM | POA: Diagnosis not present

## 2012-07-02 ENCOUNTER — Other Ambulatory Visit: Payer: Self-pay | Admitting: Cardiology

## 2012-07-04 ENCOUNTER — Encounter: Payer: Self-pay | Admitting: Internal Medicine

## 2012-07-15 ENCOUNTER — Encounter: Payer: Self-pay | Admitting: Internal Medicine

## 2012-07-30 DIAGNOSIS — H3530 Unspecified macular degeneration: Secondary | ICD-10-CM | POA: Diagnosis not present

## 2012-07-30 DIAGNOSIS — H35059 Retinal neovascularization, unspecified, unspecified eye: Secondary | ICD-10-CM | POA: Diagnosis not present

## 2012-07-30 DIAGNOSIS — H35359 Cystoid macular degeneration, unspecified eye: Secondary | ICD-10-CM | POA: Diagnosis not present

## 2012-08-08 ENCOUNTER — Encounter: Payer: Self-pay | Admitting: Cardiology

## 2012-08-09 DIAGNOSIS — H353290 Exudative age-related macular degeneration, unspecified eye, stage unspecified: Secondary | ICD-10-CM | POA: Diagnosis not present

## 2012-09-04 ENCOUNTER — Other Ambulatory Visit: Payer: Self-pay | Admitting: Cardiology

## 2012-09-05 ENCOUNTER — Telehealth: Payer: Self-pay | Admitting: *Deleted

## 2012-09-05 MED ORDER — RAMIPRIL 10 MG PO CAPS: 10.0000 mg | ORAL_CAPSULE | Freq: Every day | ORAL | Status: DC

## 2012-09-05 MED ORDER — METOPROLOL SUCCINATE ER 50 MG PO TB24: 50.0000 mg | ORAL_TABLET | Freq: Every day | ORAL | Status: DC

## 2012-09-05 MED ORDER — SIMVASTATIN 80 MG PO TABS: 80.0000 mg | ORAL_TABLET | Freq: Every day | ORAL | Status: DC

## 2012-09-05 NOTE — Telephone Encounter (Signed)
Pt needs to schedule ov for further refills [email or web address]

## 2012-09-05 NOTE — Telephone Encounter (Signed)
S/w pt was very upset with level of care and service pt stated office was to call and schedule appt after stress test 1 year ago but know one contacted pt I made appt for pt to see Dr. Thomasene Lot and stated I would fill pt's meds but give him no refills until pt was seen. Scripts were sent into today to [facility]

## 2012-09-05 NOTE — Telephone Encounter (Signed)
Took medications pt was requesting off the work que 1. Ramipril ( 10 mg ) daily 2. Metoprolol ( 50 mg ) daily 3. Simvastatin ( 80 ) mg daily  Sent to [facility]

## 2012-09-10 ENCOUNTER — Encounter: Payer: Medicare Other | Admitting: Internal Medicine

## 2012-09-17 ENCOUNTER — Encounter: Payer: Self-pay | Admitting: Internal Medicine

## 2012-11-07 ENCOUNTER — Ambulatory Visit: Payer: Self-pay | Admitting: Neurology

## 2012-11-19 DIAGNOSIS — H35059 Retinal neovascularization, unspecified, unspecified eye: Secondary | ICD-10-CM | POA: Diagnosis not present

## 2012-11-19 DIAGNOSIS — H353290 Exudative age-related macular degeneration, unspecified eye, stage unspecified: Secondary | ICD-10-CM | POA: Diagnosis not present

## 2012-11-19 DIAGNOSIS — H35359 Cystoid macular degeneration, unspecified eye: Secondary | ICD-10-CM | POA: Diagnosis not present

## 2012-12-05 ENCOUNTER — Encounter: Payer: Self-pay | Admitting: Cardiology

## 2012-12-05 ENCOUNTER — Ambulatory Visit (INDEPENDENT_AMBULATORY_CARE_PROVIDER_SITE_OTHER): Payer: Medicare Other | Admitting: Cardiology

## 2012-12-05 VITALS — BP 140/80 | HR 74 | Ht 74.0 in | Wt 140.0 lb

## 2012-12-05 DIAGNOSIS — E785 Hyperlipidemia, unspecified: Secondary | ICD-10-CM

## 2012-12-05 DIAGNOSIS — I10 Essential (primary) hypertension: Secondary | ICD-10-CM

## 2012-12-05 DIAGNOSIS — I2510 Atherosclerotic heart disease of native coronary artery without angina pectoris: Secondary | ICD-10-CM | POA: Diagnosis not present

## 2012-12-05 NOTE — Progress Notes (Signed)
[name] Date of Birth: 17-Feb-1941 Medical Record #[id number]  History of Present Illness: [name] is seen today for followup. He has a known history of coronary disease with a myocardial infarction in 2007. Cardiac catheterization at that time demonstrated occlusion of the mid left circumflex coronary. He had a 50-70% stenosis in the mid LAD that was eccentric. He had a Myoview study in June of 2013 that showed an area of scar in the inferior lateral wall. This is unchanged compared to 2009. He continues to do very while from a cardiac standpoint with no recurrent symptoms of chest pain or shortness of breath. He was hospitalized this past September with an unusual illness . Initially he was diagnosed with rapidly progressive Parkinson's disease but subsequent neurologic followup felt that this was not the case and he is no longer on any Parkinson's medications. He also had a cholecystectomy in December. Her   Current Outpatient Prescriptions on File Prior to Visit  Medication Sig Dispense Refill  . aspirin EC 81 MG tablet Take 81 mg by mouth daily.      . metoprolol succinate (TOPROL-XL) 50 MG 24 hr tablet Take 1 tablet (50 mg total) by mouth daily. Take with or immediately following a meal.  90 tablet  0  . ramipril (ALTACE) 10 MG capsule Take 1 capsule (10 mg total) by mouth daily.  90 capsule  0  . simvastatin (ZOCOR) 80 MG tablet Take 1 tablet (80 mg total) by mouth at bedtime.  90 tablet  0   No current facility-administered medications on file prior to visit.    Allergies  Allergen Reactions  . Unasyn [Ampicillin-Sulbactam Sodium] Rash    Past Medical History  Diagnosis Date  . Dyslipidemia   . Overweight(278.02)     with body mass index of 29  . HTN (hypertension)   . CAD (coronary artery disease) 2007    occlusion of mid LCX, 50-70% LAD  . Myocardial infarction 1/07    Acute non-ST-elevation myocardial infarction  . Anginal pain   . Choledocholithiasis  02/14/2012  . Acute cholecystitis 02/14/2012  . Macular degeneration     Past Surgical History  Procedure Laterality Date  . Cardiac catheterization  03/17/2005    Ejection fraction is estimated at 55%  . Cataract extraction w/ intraocular lens  implant, bilateral  ~ 2010  . Knee surgery  1980's    "took knee out and put it back in" ; left   . Cholecystectomy  02/15/2012    Procedure: LAPAROSCOPIC CHOLECYSTECTOMY WITH INTRAOPERATIVE CHOLANGIOGRAM;  Surgeon: Almond Lint, MD;  Location: [facility] OR;  Service: General;  Laterality: N/A;    History  Smoking status  . Never Smoker   Smokeless tobacco  . Never Used    History  Alcohol Use No    Comment: 12/05/2011 "stopped all alcohol > 25 years ago; never drank heavily"    Family History  Problem Relation Age of Onset  . Heart disease Father   . Cancer Mother     breast  . Cancer Maternal Aunt     breast    Review of Systems: As noted in history of present illness.  All other systems were reviewed and are negative.  Physical Exam: BP 140/80  Pulse 74  Ht 6\' 2"  (1.88 m)  Wt 63.504 kg (140 lb)  BMI 17.97 kg/m2 The patient is alert and oriented x 3.   The skin is warm and dry.  The HEENT exam is normal.  The carotids are 2+ without bruits.  There is no thyromegaly.  There is no JVD.  The lungs are clear.  The chest wall is non tender.  The heart exam reveals a regular rate with a normal S1 and S2.  There are no murmurs, gallops, or rubs.  The PMI is not displaced.   Abdominal exam reveals good bowel sounds.  There is no guarding or rebound.  There is no hepatosplenomegaly or tenderness.  There are no masses.  Exam of the legs reveal no clubbing, cyanosis, or edema.  The legs are without rashes.  The distal pulses are intact.  Cranial nerves II - XII are intact.  Motor and sensory functions are intact.  The gait is normal.  LABORATORY DATA: ECG reviewed from last December and was normal.   Assessment / Plan:  1. Coronary disease  with history of occlusion of the mid left circumflex coronary. He remains asymptomatic. Myoview study in June of 2013 was unchanged. We'll continue current therapy with aspirin, statin, and beta blocker therapy. 2. Hypertension-well-controlled. Continue ACE inhibitor and beta blocker therapy. 3. Dyslipidemia patient is on high-dose statin therapy.

## 2012-12-05 NOTE — Patient Instructions (Signed)
Continue your current therapy  I will see you in one year   

## 2012-12-24 DIAGNOSIS — R7301 Impaired fasting glucose: Secondary | ICD-10-CM | POA: Diagnosis not present

## 2012-12-24 DIAGNOSIS — I2510 Atherosclerotic heart disease of native coronary artery without angina pectoris: Secondary | ICD-10-CM | POA: Diagnosis not present

## 2012-12-24 DIAGNOSIS — I10 Essential (primary) hypertension: Secondary | ICD-10-CM | POA: Diagnosis not present

## 2012-12-24 DIAGNOSIS — I872 Venous insufficiency (chronic) (peripheral): Secondary | ICD-10-CM | POA: Diagnosis not present

## 2012-12-24 DIAGNOSIS — Z23 Encounter for immunization: Secondary | ICD-10-CM | POA: Diagnosis not present

## 2012-12-24 DIAGNOSIS — E669 Obesity, unspecified: Secondary | ICD-10-CM | POA: Diagnosis not present

## 2012-12-24 DIAGNOSIS — E785 Hyperlipidemia, unspecified: Secondary | ICD-10-CM | POA: Diagnosis not present

## 2012-12-24 DIAGNOSIS — I252 Old myocardial infarction: Secondary | ICD-10-CM | POA: Diagnosis not present

## 2013-01-06 ENCOUNTER — Other Ambulatory Visit: Payer: Self-pay | Admitting: Cardiology

## 2013-01-08 ENCOUNTER — Other Ambulatory Visit: Payer: Self-pay

## 2013-01-08 MED ORDER — SIMVASTATIN 80 MG PO TABS: 80.0000 mg | ORAL_TABLET | Freq: Every day | ORAL | Status: DC

## 2013-01-13 ENCOUNTER — Telehealth: Payer: Self-pay

## 2013-01-13 NOTE — Telephone Encounter (Signed)
patient was here at  the front desk wanted to know if  his simvastatin  was sent in I called [facility] and it was sent out on 10.30.14

## 2013-02-14 DIAGNOSIS — H353290 Exudative age-related macular degeneration, unspecified eye, stage unspecified: Secondary | ICD-10-CM | POA: Diagnosis not present

## 2013-04-18 DIAGNOSIS — H3530 Unspecified macular degeneration: Secondary | ICD-10-CM | POA: Diagnosis not present

## 2013-04-18 DIAGNOSIS — H35059 Retinal neovascularization, unspecified, unspecified eye: Secondary | ICD-10-CM | POA: Diagnosis not present

## 2013-04-18 DIAGNOSIS — H35359 Cystoid macular degeneration, unspecified eye: Secondary | ICD-10-CM | POA: Diagnosis not present

## 2013-06-05 DIAGNOSIS — H35059 Retinal neovascularization, unspecified, unspecified eye: Secondary | ICD-10-CM | POA: Diagnosis not present

## 2013-06-05 DIAGNOSIS — H3530 Unspecified macular degeneration: Secondary | ICD-10-CM | POA: Diagnosis not present

## 2013-06-05 DIAGNOSIS — H35359 Cystoid macular degeneration, unspecified eye: Secondary | ICD-10-CM | POA: Diagnosis not present

## 2013-06-25 DIAGNOSIS — R7301 Impaired fasting glucose: Secondary | ICD-10-CM | POA: Diagnosis not present

## 2013-06-25 DIAGNOSIS — I10 Essential (primary) hypertension: Secondary | ICD-10-CM | POA: Diagnosis not present

## 2013-06-25 DIAGNOSIS — E785 Hyperlipidemia, unspecified: Secondary | ICD-10-CM | POA: Diagnosis not present

## 2013-06-25 DIAGNOSIS — I2510 Atherosclerotic heart disease of native coronary artery without angina pectoris: Secondary | ICD-10-CM | POA: Diagnosis not present

## 2013-07-01 DIAGNOSIS — I872 Venous insufficiency (chronic) (peripheral): Secondary | ICD-10-CM | POA: Diagnosis not present

## 2013-07-01 DIAGNOSIS — H3530 Unspecified macular degeneration: Secondary | ICD-10-CM | POA: Diagnosis not present

## 2013-07-01 DIAGNOSIS — G20 Parkinson's disease: Secondary | ICD-10-CM | POA: Diagnosis not present

## 2013-07-01 DIAGNOSIS — I252 Old myocardial infarction: Secondary | ICD-10-CM | POA: Diagnosis not present

## 2013-07-01 DIAGNOSIS — E739 Lactose intolerance, unspecified: Secondary | ICD-10-CM | POA: Diagnosis not present

## 2013-07-01 DIAGNOSIS — E785 Hyperlipidemia, unspecified: Secondary | ICD-10-CM | POA: Diagnosis not present

## 2013-07-01 DIAGNOSIS — I10 Essential (primary) hypertension: Secondary | ICD-10-CM | POA: Diagnosis not present

## 2013-07-01 DIAGNOSIS — Z0000 Encounter for general adult medical examination without abnormal findings: Secondary | ICD-10-CM | POA: Diagnosis not present

## 2013-07-04 DIAGNOSIS — Z1212 Encounter for screening for malignant neoplasm of rectum: Secondary | ICD-10-CM | POA: Diagnosis not present

## 2013-07-17 DIAGNOSIS — H3530 Unspecified macular degeneration: Secondary | ICD-10-CM | POA: Diagnosis not present

## 2013-07-17 DIAGNOSIS — H02409 Unspecified ptosis of unspecified eyelid: Secondary | ICD-10-CM | POA: Diagnosis not present

## 2013-07-17 DIAGNOSIS — H04129 Dry eye syndrome of unspecified lacrimal gland: Secondary | ICD-10-CM | POA: Diagnosis not present

## 2013-07-17 DIAGNOSIS — H35359 Cystoid macular degeneration, unspecified eye: Secondary | ICD-10-CM | POA: Diagnosis not present

## 2013-07-17 DIAGNOSIS — H35059 Retinal neovascularization, unspecified, unspecified eye: Secondary | ICD-10-CM | POA: Diagnosis not present

## 2013-09-22 DIAGNOSIS — H35359 Cystoid macular degeneration, unspecified eye: Secondary | ICD-10-CM | POA: Diagnosis not present

## 2013-09-22 DIAGNOSIS — T50901A Poisoning by unspecified drugs, medicaments and biological substances, accidental (unintentional), initial encounter: Secondary | ICD-10-CM | POA: Diagnosis not present

## 2013-09-22 DIAGNOSIS — H3530 Unspecified macular degeneration: Secondary | ICD-10-CM | POA: Diagnosis not present

## 2013-09-22 DIAGNOSIS — H04129 Dry eye syndrome of unspecified lacrimal gland: Secondary | ICD-10-CM | POA: Diagnosis not present

## 2013-09-22 DIAGNOSIS — H35059 Retinal neovascularization, unspecified, unspecified eye: Secondary | ICD-10-CM | POA: Diagnosis not present

## 2013-10-14 DIAGNOSIS — R7301 Impaired fasting glucose: Secondary | ICD-10-CM | POA: Diagnosis not present

## 2013-10-14 DIAGNOSIS — Z1331 Encounter for screening for depression: Secondary | ICD-10-CM | POA: Diagnosis not present

## 2013-10-14 DIAGNOSIS — Z23 Encounter for immunization: Secondary | ICD-10-CM | POA: Diagnosis not present

## 2013-10-14 DIAGNOSIS — H3530 Unspecified macular degeneration: Secondary | ICD-10-CM | POA: Diagnosis not present

## 2013-10-14 DIAGNOSIS — I10 Essential (primary) hypertension: Secondary | ICD-10-CM | POA: Diagnosis not present

## 2013-10-14 DIAGNOSIS — I252 Old myocardial infarction: Secondary | ICD-10-CM | POA: Diagnosis not present

## 2013-10-14 DIAGNOSIS — G20 Parkinson's disease: Secondary | ICD-10-CM | POA: Diagnosis not present

## 2013-10-14 DIAGNOSIS — E785 Hyperlipidemia, unspecified: Secondary | ICD-10-CM | POA: Diagnosis not present

## 2013-10-16 DIAGNOSIS — H3530 Unspecified macular degeneration: Secondary | ICD-10-CM | POA: Diagnosis not present

## 2013-10-16 DIAGNOSIS — H35359 Cystoid macular degeneration, unspecified eye: Secondary | ICD-10-CM | POA: Diagnosis not present

## 2013-10-16 DIAGNOSIS — H35379 Puckering of macula, unspecified eye: Secondary | ICD-10-CM | POA: Diagnosis not present

## 2013-10-16 DIAGNOSIS — H04129 Dry eye syndrome of unspecified lacrimal gland: Secondary | ICD-10-CM | POA: Diagnosis not present

## 2013-10-16 DIAGNOSIS — H353290 Exudative age-related macular degeneration, unspecified eye, stage unspecified: Secondary | ICD-10-CM | POA: Diagnosis not present

## 2013-10-16 DIAGNOSIS — H35059 Retinal neovascularization, unspecified, unspecified eye: Secondary | ICD-10-CM | POA: Diagnosis not present

## 2013-10-23 DIAGNOSIS — H26499 Other secondary cataract, unspecified eye: Secondary | ICD-10-CM | POA: Diagnosis not present

## 2013-10-23 DIAGNOSIS — Z961 Presence of intraocular lens: Secondary | ICD-10-CM | POA: Diagnosis not present

## 2013-10-23 DIAGNOSIS — H353290 Exudative age-related macular degeneration, unspecified eye, stage unspecified: Secondary | ICD-10-CM | POA: Diagnosis not present

## 2013-10-23 DIAGNOSIS — H18519 Endothelial corneal dystrophy, unspecified eye: Secondary | ICD-10-CM | POA: Diagnosis not present

## 2013-12-09 ENCOUNTER — Ambulatory Visit (INDEPENDENT_AMBULATORY_CARE_PROVIDER_SITE_OTHER): Payer: Medicare Other | Admitting: Cardiology

## 2013-12-09 ENCOUNTER — Encounter: Payer: Self-pay | Admitting: Cardiology

## 2013-12-09 VITALS — BP 142/78 | HR 74 | Ht 74.0 in | Wt 226.7 lb

## 2013-12-09 DIAGNOSIS — I10 Essential (primary) hypertension: Secondary | ICD-10-CM

## 2013-12-09 DIAGNOSIS — I2510 Atherosclerotic heart disease of native coronary artery without angina pectoris: Secondary | ICD-10-CM | POA: Diagnosis not present

## 2013-12-09 DIAGNOSIS — E785 Hyperlipidemia, unspecified: Secondary | ICD-10-CM

## 2013-12-09 NOTE — Patient Instructions (Signed)
Continue your current therapy  I will see you in one year   

## 2013-12-09 NOTE — Progress Notes (Signed)
[name] Date of Birth: 1941-02-17 Medical Record #[id number]  History of Present Illness: [name] is seen today for followup. He has a known history of coronary disease with a myocardial infarction in 2007. Cardiac catheterization at that time demonstrated occlusion of the mid left circumflex coronary. He had a 50-70% stenosis in the mid LAD that was eccentric. He had a Myoview study in June of 2013 that showed an area of scar in the inferior lateral wall. This is unchanged compared to 2009. He continues to do very while from a cardiac standpoint with no recurrent symptoms of chest pain or shortness of breath. He walks everyday. He has lost 13 lbs recently with dietary modification.  Current Outpatient Prescriptions on File Prior to Visit  Medication Sig Dispense Refill  . aspirin EC 81 MG tablet Take 81 mg by mouth daily.      . metoprolol succinate (TOPROL-XL) 50 MG 24 hr tablet TAKE 1 TABLET EVERY DAY  90 tablet  2  . Multiple Vitamin (MULTIVITAMIN) tablet Take 1 tablet by mouth daily.      . Naproxen Sodium (ALEVE PO) Take by mouth daily.      . ramipril (ALTACE) 10 MG capsule TAKE 1 CAPSULE EVERY DAY  90 capsule  2  . simvastatin (ZOCOR) 80 MG tablet Take 1 tablet (80 mg total) by mouth at bedtime.  90 tablet  3   No current facility-administered medications on file prior to visit.    Allergies  Allergen Reactions  . Unasyn [Ampicillin-Sulbactam Sodium] Rash    Past Medical History  Diagnosis Date  . Dyslipidemia   . Overweight(278.02)     with body mass index of 29  . HTN (hypertension)   . CAD (coronary artery disease) 2007    occlusion of mid LCX, 50-70% LAD  . Myocardial infarction 1/07    Acute non-ST-elevation myocardial infarction  . Anginal pain   . Choledocholithiasis 02/14/2012  . Acute cholecystitis 02/14/2012  . Macular degeneration     Past Surgical History  Procedure Laterality Date  . Cardiac catheterization  03/17/2005    Ejection  fraction is estimated at 55%  . Cataract extraction w/ intraocular lens  implant, bilateral  ~ 2010  . Knee surgery  1980's    "took knee out and put it back in" ; left   . Cholecystectomy  02/15/2012    Procedure: LAPAROSCOPIC CHOLECYSTECTOMY WITH INTRAOPERATIVE CHOLANGIOGRAM;  Surgeon: Stark Klein, MD;  Location: [facility];  Service: General;  Laterality: N/A;    History  Smoking status  . Never Smoker   Smokeless tobacco  . Never Used    History  Alcohol Use No    Comment: 12/05/2011 "stopped all alcohol > 25 years ago; never drank heavily"    Family History  Problem Relation Age of Onset  . Heart disease Father   . Cancer Mother     breast  . Cancer Maternal Aunt     breast    Review of Systems: As noted in history of present illness.  All other systems were reviewed and are negative.  Physical Exam: BP 142/78  Pulse 74  Ht 6\' 2"  (1.88 m)  Wt 226 lb 11.2 oz (102.83 kg)  BMI 29.09 kg/m2 WDWM in NAD.  The HEENT exam is normal.  The carotids are 2+ without bruits.  There is no thyromegaly.  There is no JVD.  The lungs are clear.   The heart exam reveals a regular rate with a normal  S1 and S2.  There are no murmurs, gallops, or rubs.  The PMI is not displaced.   Abdominal exam reveals good bowel sounds.    There is no hepatosplenomegaly or tenderness.  There are no masses.  Exam of the legs reveal no clubbing, cyanosis, or edema.  The legs are without rashes.  The distal pulses are intact.  Cranial nerves II - XII are intact.  Motor and sensory functions are intact.  The gait is normal.  LABORATORY DATA: Ecg: NSR with normal Ecg.  Assessment / Plan:  1. Coronary disease with history of occlusion of the mid left circumflex coronary. He remains asymptomatic. Myoview study in June of 2013 was unchanged. We'll continue current therapy with aspirin, statin, and beta blocker therapy.  2. Hypertension-well-controlled. Continue ACE inhibitor and beta blocker therapy.  3.  Dyslipidemia patient is on high-dose statin therapy. Lipids followed by Dr. Osborne Casco.

## 2013-12-11 DIAGNOSIS — Z9849 Cataract extraction status, unspecified eye: Secondary | ICD-10-CM | POA: Diagnosis not present

## 2013-12-11 DIAGNOSIS — H35723 Serous detachment of retinal pigment epithelium, bilateral: Secondary | ICD-10-CM | POA: Diagnosis not present

## 2013-12-11 DIAGNOSIS — H3530 Unspecified macular degeneration: Secondary | ICD-10-CM | POA: Diagnosis not present

## 2013-12-11 DIAGNOSIS — Z79899 Other long term (current) drug therapy: Secondary | ICD-10-CM | POA: Diagnosis not present

## 2013-12-11 DIAGNOSIS — H3532 Exudative age-related macular degeneration: Secondary | ICD-10-CM | POA: Diagnosis not present

## 2013-12-11 DIAGNOSIS — H04123 Dry eye syndrome of bilateral lacrimal glands: Secondary | ICD-10-CM | POA: Diagnosis not present

## 2013-12-11 DIAGNOSIS — D3131 Benign neoplasm of right choroid: Secondary | ICD-10-CM | POA: Diagnosis not present

## 2013-12-11 DIAGNOSIS — I10 Essential (primary) hypertension: Secondary | ICD-10-CM | POA: Diagnosis not present

## 2013-12-11 DIAGNOSIS — H3581 Retinal edema: Secondary | ICD-10-CM | POA: Diagnosis not present

## 2013-12-11 DIAGNOSIS — H35362 Drusen (degenerative) of macula, left eye: Secondary | ICD-10-CM | POA: Diagnosis not present

## 2013-12-11 DIAGNOSIS — H35051 Retinal neovascularization, unspecified, right eye: Secondary | ICD-10-CM | POA: Diagnosis not present

## 2013-12-11 DIAGNOSIS — I252 Old myocardial infarction: Secondary | ICD-10-CM | POA: Diagnosis not present

## 2013-12-11 DIAGNOSIS — Z7982 Long term (current) use of aspirin: Secondary | ICD-10-CM | POA: Diagnosis not present

## 2013-12-11 DIAGNOSIS — H35351 Cystoid macular degeneration, right eye: Secondary | ICD-10-CM | POA: Diagnosis not present

## 2013-12-16 ENCOUNTER — Other Ambulatory Visit: Payer: Self-pay | Admitting: *Deleted

## 2013-12-16 ENCOUNTER — Other Ambulatory Visit: Payer: Self-pay

## 2013-12-16 MED ORDER — RAMIPRIL 10 MG PO CAPS: ORAL_CAPSULE | ORAL | Status: DC

## 2013-12-16 MED ORDER — METOPROLOL SUCCINATE ER 50 MG PO TB24: ORAL_TABLET | ORAL | Status: DC

## 2014-01-01 DIAGNOSIS — I10 Essential (primary) hypertension: Secondary | ICD-10-CM | POA: Diagnosis not present

## 2014-01-01 DIAGNOSIS — Z23 Encounter for immunization: Secondary | ICD-10-CM | POA: Diagnosis not present

## 2014-01-01 DIAGNOSIS — E785 Hyperlipidemia, unspecified: Secondary | ICD-10-CM | POA: Diagnosis not present

## 2014-01-01 DIAGNOSIS — E669 Obesity, unspecified: Secondary | ICD-10-CM | POA: Diagnosis not present

## 2014-01-01 DIAGNOSIS — Z6829 Body mass index (BMI) 29.0-29.9, adult: Secondary | ICD-10-CM | POA: Diagnosis not present

## 2014-01-01 DIAGNOSIS — H3530 Unspecified macular degeneration: Secondary | ICD-10-CM | POA: Diagnosis not present

## 2014-01-01 DIAGNOSIS — I872 Venous insufficiency (chronic) (peripheral): Secondary | ICD-10-CM | POA: Diagnosis not present

## 2014-01-01 DIAGNOSIS — I252 Old myocardial infarction: Secondary | ICD-10-CM | POA: Diagnosis not present

## 2014-01-06 DIAGNOSIS — H5021 Vertical strabismus, right eye: Secondary | ICD-10-CM | POA: Diagnosis not present

## 2014-01-06 DIAGNOSIS — H532 Diplopia: Secondary | ICD-10-CM | POA: Diagnosis not present

## 2014-01-06 DIAGNOSIS — H1851 Endothelial corneal dystrophy: Secondary | ICD-10-CM | POA: Diagnosis not present

## 2014-01-06 DIAGNOSIS — Z961 Presence of intraocular lens: Secondary | ICD-10-CM | POA: Diagnosis not present

## 2014-01-07 DIAGNOSIS — Z6828 Body mass index (BMI) 28.0-28.9, adult: Secondary | ICD-10-CM | POA: Diagnosis not present

## 2014-01-07 DIAGNOSIS — J069 Acute upper respiratory infection, unspecified: Secondary | ICD-10-CM | POA: Diagnosis not present

## 2014-01-07 DIAGNOSIS — R05 Cough: Secondary | ICD-10-CM | POA: Diagnosis not present

## 2014-01-13 ENCOUNTER — Other Ambulatory Visit: Payer: Self-pay | Admitting: Internal Medicine

## 2014-01-13 DIAGNOSIS — H532 Diplopia: Secondary | ICD-10-CM

## 2014-01-13 DIAGNOSIS — R490 Dysphonia: Secondary | ICD-10-CM | POA: Diagnosis not present

## 2014-01-13 DIAGNOSIS — R1310 Dysphagia, unspecified: Secondary | ICD-10-CM

## 2014-01-13 DIAGNOSIS — Z6828 Body mass index (BMI) 28.0-28.9, adult: Secondary | ICD-10-CM | POA: Diagnosis not present

## 2014-01-13 DIAGNOSIS — R479 Unspecified speech disturbances: Secondary | ICD-10-CM

## 2014-01-14 ENCOUNTER — Ambulatory Visit
Admission: RE | Admit: 2014-01-14 | Discharge: 2014-01-14 | Disposition: A | Discharge status: Home / Self Care | Payer: Medicare Other | Source: Ambulatory Visit | Attending: Internal Medicine | Admitting: Internal Medicine

## 2014-01-14 DIAGNOSIS — R1310 Dysphagia, unspecified: Secondary | ICD-10-CM | POA: Diagnosis not present

## 2014-01-14 DIAGNOSIS — H532 Diplopia: Secondary | ICD-10-CM

## 2014-01-14 DIAGNOSIS — R479 Unspecified speech disturbances: Secondary | ICD-10-CM | POA: Diagnosis not present

## 2014-01-14 DIAGNOSIS — I6782 Cerebral ischemia: Secondary | ICD-10-CM | POA: Diagnosis not present

## 2014-01-14 MED ORDER — GADOBENATE DIMEGLUMINE 529 MG/ML IV SOLN
20.0000 mL | Freq: Once | INTRAVENOUS | Status: AC | PRN
  Administered 2014-01-14: 20 mL via INTRAVENOUS

## 2014-01-22 DIAGNOSIS — I679 Cerebrovascular disease, unspecified: Secondary | ICD-10-CM | POA: Diagnosis not present

## 2014-02-20 DIAGNOSIS — Z6828 Body mass index (BMI) 28.0-28.9, adult: Secondary | ICD-10-CM | POA: Diagnosis not present

## 2014-02-20 DIAGNOSIS — I679 Cerebrovascular disease, unspecified: Secondary | ICD-10-CM | POA: Diagnosis not present

## 2014-02-20 DIAGNOSIS — R1310 Dysphagia, unspecified: Secondary | ICD-10-CM | POA: Diagnosis not present

## 2014-02-20 DIAGNOSIS — R490 Dysphonia: Secondary | ICD-10-CM | POA: Diagnosis not present

## 2014-02-23 DIAGNOSIS — H3532 Exudative age-related macular degeneration: Secondary | ICD-10-CM | POA: Diagnosis not present

## 2014-02-23 DIAGNOSIS — H3530 Unspecified macular degeneration: Secondary | ICD-10-CM | POA: Diagnosis not present

## 2014-02-23 DIAGNOSIS — H35351 Cystoid macular degeneration, right eye: Secondary | ICD-10-CM | POA: Diagnosis not present

## 2014-02-23 DIAGNOSIS — H35051 Retinal neovascularization, unspecified, right eye: Secondary | ICD-10-CM | POA: Diagnosis not present

## 2014-02-25 ENCOUNTER — Other Ambulatory Visit (HOSPITAL_COMMUNITY): Payer: Self-pay | Admitting: Internal Medicine

## 2014-02-25 DIAGNOSIS — R1314 Dysphagia, pharyngoesophageal phase: Secondary | ICD-10-CM

## 2014-03-03 ENCOUNTER — Other Ambulatory Visit: Payer: Self-pay | Admitting: Cardiology

## 2014-03-03 ENCOUNTER — Ambulatory Visit (HOSPITAL_COMMUNITY)
Admission: RE | Admit: 2014-03-03 | Discharge: 2014-03-03 | Disposition: A | Discharge status: Home / Self Care | Payer: Medicare Other | Source: Ambulatory Visit | Attending: Internal Medicine | Admitting: Internal Medicine

## 2014-03-03 ENCOUNTER — Other Ambulatory Visit (HOSPITAL_COMMUNITY): Payer: Medicare Other

## 2014-03-19 ENCOUNTER — Ambulatory Visit (HOSPITAL_COMMUNITY)
Admission: RE | Admit: 2014-03-19 | Discharge: 2014-03-19 | Disposition: A | Discharge status: Home / Self Care | Payer: Medicare Other | Source: Ambulatory Visit | Attending: Internal Medicine | Admitting: Internal Medicine

## 2014-03-19 DIAGNOSIS — Z8673 Personal history of transient ischemic attack (TIA), and cerebral infarction without residual deficits: Secondary | ICD-10-CM | POA: Insufficient documentation

## 2014-03-19 DIAGNOSIS — R1314 Dysphagia, pharyngoesophageal phase: Secondary | ICD-10-CM

## 2014-03-19 DIAGNOSIS — R1313 Dysphagia, pharyngeal phase: Secondary | ICD-10-CM | POA: Diagnosis not present

## 2014-03-19 DIAGNOSIS — I10 Essential (primary) hypertension: Secondary | ICD-10-CM | POA: Insufficient documentation

## 2014-03-19 DIAGNOSIS — I252 Old myocardial infarction: Secondary | ICD-10-CM | POA: Insufficient documentation

## 2014-03-19 DIAGNOSIS — R1310 Dysphagia, unspecified: Secondary | ICD-10-CM | POA: Diagnosis not present

## 2014-03-19 DIAGNOSIS — I639 Cerebral infarction, unspecified: Secondary | ICD-10-CM | POA: Diagnosis not present

## 2014-03-19 DIAGNOSIS — E785 Hyperlipidemia, unspecified: Secondary | ICD-10-CM | POA: Diagnosis not present

## 2014-03-19 DIAGNOSIS — Z9049 Acquired absence of other specified parts of digestive tract: Secondary | ICD-10-CM | POA: Insufficient documentation

## 2014-03-19 DIAGNOSIS — I2510 Atherosclerotic heart disease of native coronary artery without angina pectoris: Secondary | ICD-10-CM | POA: Insufficient documentation

## 2014-03-19 DIAGNOSIS — H3530 Unspecified macular degeneration: Secondary | ICD-10-CM | POA: Diagnosis not present

## 2014-03-19 NOTE — Procedures (Addendum)
Objective Swallowing Evaluation: Modified Barium Swallowing Study  Patient Details  Name: [name] MRN: [id number] Date of Birth: 1940/08/02  Today's Date: 03/19/2014 Time: 1250-1330 SLP Time Calculation (min) (ACUTE ONLY): 40 min  Past Medical History:  Past Medical History  Diagnosis Date  . Dyslipidemia   . Overweight(278.02)     with body mass index of 29  . HTN (hypertension)   . CAD (coronary artery disease) 2007    occlusion of mid LCX, 50-70% LAD  . Myocardial infarction 1/07    Acute non-ST-elevation myocardial infarction  . Anginal pain   . Choledocholithiasis 02/14/2012  . Acute cholecystitis 02/14/2012  . Macular degeneration    Past Surgical History:  Past Surgical History  Procedure Laterality Date  . Cardiac catheterization  03/17/2005    Ejection fraction is estimated at 55%  . Cataract extraction w/ intraocular lens  implant, bilateral  ~ 2010  . Knee surgery  1980's    "took knee out and put it back in" ; left   . Cholecystectomy  02/15/2012    Procedure: LAPAROSCOPIC CHOLECYSTECTOMY WITH INTRAOPERATIVE CHOLANGIOGRAM;  Surgeon: Stark Klein, MD;  Location: [facility] OR;  Service: General;  Laterality: N/A;   HPI:  74 yr old seen for outpatient MBS with history of right CVA, HTN, MI, macular degeneration. Pt reports difficulty managing saliva and speech changes beginning August 2015. He states presence pf throat clearing and pharyngeal globus sensation. Pt report no awareness of stroke until MRI completed. Pt states he "does better with dry foods versus doughy textures". Denies frequent coughing with food or liquids. Pt reports in the past that one MD suspected Parkinson's but neurologist did not concur.     Assessment / Plan / Recommendation Clinical Impression  Dysphagia Diagnosis: Moderate pharyngeal phase dysphagia;Severe pharyngeal phase dysphagia Clinical impression: Pt. exhibited moderate-severe pharyngeal dysphagia as evidenced by significantly  decreased hyolaryngeal elevation with little to no epigottic inversion to protect laryngeal vestibule, decreased pharyngeal peristalsis and reduced tongue base retraction. Impairments led to laryngeal penetration on anterior vestibular wall and vocal cords with inconsistent sensation to penetrates and intermittent reflexive throat clear. Therapeutic cues for throat clear effectively cleared vestibule. Moderate (mod-severe) vallecular residue present and mild in pyriform sinsuses without sensation when questioned by SLP. Verbal cues for second swallow reduced, however, did not completely clear. Thicker liquids (nectar) resulted in increased residue and increased penetrate volume. Chin tuck posture was ineffective to decrease penetration or decrease vallecular residue. He is at a high risk for aspiration particularly if pt becomes ill or deconditioned and is unable to compensate for pharyngeal dysphagia. SLP recommends continue thin liquids and regular texture using caution with significantly hard or dry foods (or "doughy" per pt description), no straws, small sips, **swallow twice after bites/sips, **clear throat following bites/sips, alternate liquids and solids. Recommend outpatient Speech therapy for trial of pharyngeal strengthening (no therapeutic intervention for decreased sensation).     Treatment Recommendation  Defer treatment plan to SLP at (Comment)    Diet Recommendation Regular;Thin liquid   Liquid Administration via: Cup;No straw Medication Administration: Whole meds with liquid (if difficulty, whole in applesauce) Supervision: Patient able to self feed Compensations: Multiple dry swallows after each bite/sip;Follow solids with liquid;Clear throat after each swallow;Slow rate;Small sips/bites Postural Changes and/or Swallow Maneuvers: Seated upright 90 degrees;Upright 30-60 min after meal    Other  Recommendations Recommended Consults:  (outpatient ST) Oral Care Recommendations: Oral care  BID   Follow Up Recommendations  Outpatient SLP  Frequency and Duration        Pertinent Vitals/Pain Denied pain            Reason for Referral Objectively evaluate swallowing function   Oral Phase Oral Preparation/Oral Phase Oral Phase: WFL   Pharyngeal Phase Pharyngeal Phase Pharyngeal Phase: Impaired Pharyngeal - Nectar Pharyngeal - Nectar Cup: Pharyngeal residue - valleculae;Pharyngeal residue - pyriform sinuses;Reduced airway/laryngeal closure;Reduced laryngeal elevation;Reduced anterior laryngeal mobility;Reduced epiglottic inversion;Reduced pharyngeal peristalsis;Reduced tongue base retraction Pharyngeal - Thin Pharyngeal - Thin Cup: Pharyngeal residue - valleculae;Pharyngeal residue - pyriform sinuses;Reduced epiglottic inversion;Reduced anterior laryngeal mobility;Reduced laryngeal elevation;Reduced airway/laryngeal closure;Reduced tongue base retraction;Reduced pharyngeal peristalsis;Penetration/Aspiration during swallow Penetration/Aspiration details (thin cup): Material enters airway, CONTACTS cords then ejected out (inconsistent throat clears) Pharyngeal - Solids Pharyngeal - Regular: Pharyngeal residue - valleculae;Reduced laryngeal elevation;Reduced airway/laryngeal closure;Reduced tongue base retraction;Reduced anterior laryngeal mobility;Reduced epiglottic inversion;Reduced pharyngeal peristalsis  Cervical Esophageal Phase    GO    Cervical Esophageal Phase Cervical Esophageal Phase: [facility]    Functional Assessment Tool Used: skilled clinical judgement Functional Limitations: Swallowing Swallow Current Status (A3557): At least 80 percent but less than 100 percent impaired, limited or restricted Swallow Goal Status [id number]): At least 80 percent but less than 100 percent impaired, limited or restricted Swallow Discharge Status [phone]): At least 80 percent but less than 100 percent impaired, limited or restricted    Houston Siren 03/19/2014, 2:54  PM  Orbie Pyo Colvin Caroli.Ed [employer] [phone]

## 2014-05-07 ENCOUNTER — Ambulatory Visit: Payer: Medicare Other

## 2014-05-19 DIAGNOSIS — Z961 Presence of intraocular lens: Secondary | ICD-10-CM | POA: Diagnosis not present

## 2014-05-19 DIAGNOSIS — H5021 Vertical strabismus, right eye: Secondary | ICD-10-CM | POA: Diagnosis not present

## 2014-05-19 DIAGNOSIS — H1851 Endothelial corneal dystrophy: Secondary | ICD-10-CM | POA: Diagnosis not present

## 2014-05-28 DIAGNOSIS — Z6826 Body mass index (BMI) 26.0-26.9, adult: Secondary | ICD-10-CM | POA: Diagnosis not present

## 2014-05-28 DIAGNOSIS — J309 Allergic rhinitis, unspecified: Secondary | ICD-10-CM | POA: Diagnosis not present

## 2014-06-30 DIAGNOSIS — Z125 Encounter for screening for malignant neoplasm of prostate: Secondary | ICD-10-CM | POA: Diagnosis not present

## 2014-06-30 DIAGNOSIS — I2510 Atherosclerotic heart disease of native coronary artery without angina pectoris: Secondary | ICD-10-CM | POA: Diagnosis not present

## 2014-06-30 DIAGNOSIS — I10 Essential (primary) hypertension: Secondary | ICD-10-CM | POA: Diagnosis not present

## 2014-06-30 DIAGNOSIS — R7302 Impaired glucose tolerance (oral): Secondary | ICD-10-CM | POA: Diagnosis not present

## 2014-06-30 DIAGNOSIS — E785 Hyperlipidemia, unspecified: Secondary | ICD-10-CM | POA: Diagnosis not present

## 2014-07-07 DIAGNOSIS — Z0000 Encounter for general adult medical examination without abnormal findings: Secondary | ICD-10-CM | POA: Diagnosis not present

## 2014-07-07 DIAGNOSIS — Z1389 Encounter for screening for other disorder: Secondary | ICD-10-CM | POA: Diagnosis not present

## 2014-07-07 DIAGNOSIS — I872 Venous insufficiency (chronic) (peripheral): Secondary | ICD-10-CM | POA: Diagnosis not present

## 2014-07-07 DIAGNOSIS — R7302 Impaired glucose tolerance (oral): Secondary | ICD-10-CM | POA: Diagnosis not present

## 2014-07-07 DIAGNOSIS — E785 Hyperlipidemia, unspecified: Secondary | ICD-10-CM | POA: Diagnosis not present

## 2014-07-07 DIAGNOSIS — I10 Essential (primary) hypertension: Secondary | ICD-10-CM | POA: Diagnosis not present

## 2014-07-07 DIAGNOSIS — I679 Cerebrovascular disease, unspecified: Secondary | ICD-10-CM | POA: Diagnosis not present

## 2014-07-07 DIAGNOSIS — I252 Old myocardial infarction: Secondary | ICD-10-CM | POA: Diagnosis not present

## 2014-07-07 DIAGNOSIS — Z6827 Body mass index (BMI) 27.0-27.9, adult: Secondary | ICD-10-CM | POA: Diagnosis not present

## 2014-07-07 DIAGNOSIS — G20 Parkinson's disease: Secondary | ICD-10-CM | POA: Diagnosis not present

## 2014-07-15 ENCOUNTER — Other Ambulatory Visit: Payer: Self-pay | Admitting: Cardiology

## 2014-07-16 DIAGNOSIS — Z1212 Encounter for screening for malignant neoplasm of rectum: Secondary | ICD-10-CM | POA: Diagnosis not present

## 2014-08-03 DIAGNOSIS — Z961 Presence of intraocular lens: Secondary | ICD-10-CM | POA: Diagnosis not present

## 2014-08-03 DIAGNOSIS — H04123 Dry eye syndrome of bilateral lacrimal glands: Secondary | ICD-10-CM | POA: Diagnosis not present

## 2014-08-03 DIAGNOSIS — H268 Other specified cataract: Secondary | ICD-10-CM | POA: Diagnosis not present

## 2014-08-03 DIAGNOSIS — H3532 Exudative age-related macular degeneration: Secondary | ICD-10-CM | POA: Diagnosis not present

## 2014-08-03 DIAGNOSIS — Z79899 Other long term (current) drug therapy: Secondary | ICD-10-CM | POA: Diagnosis not present

## 2014-08-03 DIAGNOSIS — Z8249 Family history of ischemic heart disease and other diseases of the circulatory system: Secondary | ICD-10-CM | POA: Diagnosis not present

## 2014-08-03 DIAGNOSIS — H43813 Vitreous degeneration, bilateral: Secondary | ICD-10-CM | POA: Diagnosis not present

## 2014-08-03 DIAGNOSIS — H1851 Endothelial corneal dystrophy: Secondary | ICD-10-CM | POA: Diagnosis not present

## 2014-08-03 DIAGNOSIS — L404 Guttate psoriasis: Secondary | ICD-10-CM | POA: Diagnosis not present

## 2014-08-03 DIAGNOSIS — H3589 Other specified retinal disorders: Secondary | ICD-10-CM | POA: Diagnosis not present

## 2014-08-03 DIAGNOSIS — Z7902 Long term (current) use of antithrombotics/antiplatelets: Secondary | ICD-10-CM | POA: Diagnosis not present

## 2014-08-03 DIAGNOSIS — Z83518 Family history of other specified eye disorder: Secondary | ICD-10-CM | POA: Diagnosis not present

## 2014-08-12 IMAGING — CR DG CHEST 2V
2 series · 2 of 2 positions shown · non-contrast
Comparison: Chest x-ray 03/19/2005.

CLINICAL DATA: Weakness.  Generalized body aches.  Fever.

CHEST - 2 VIEW

[w chest pa]
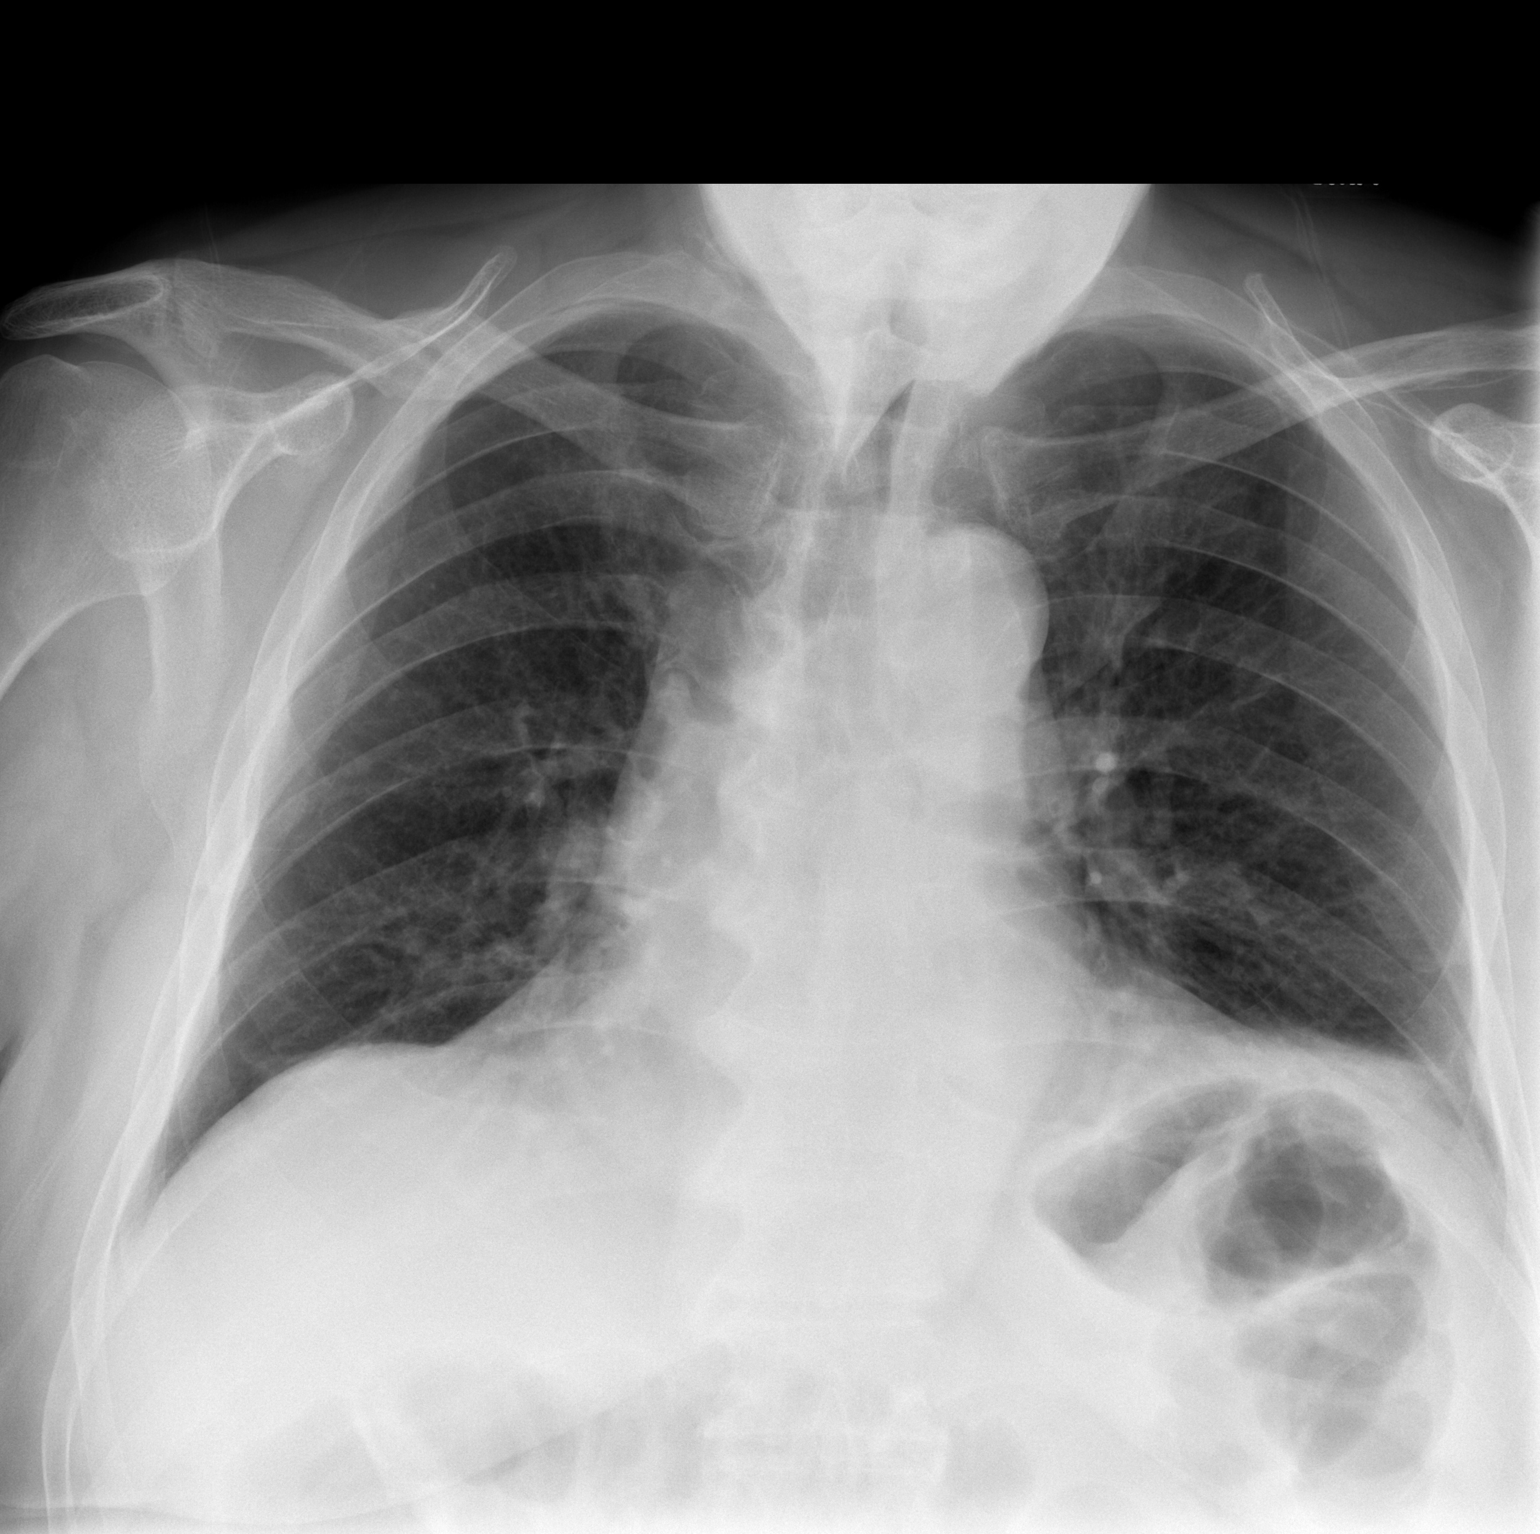

[w chest lat]
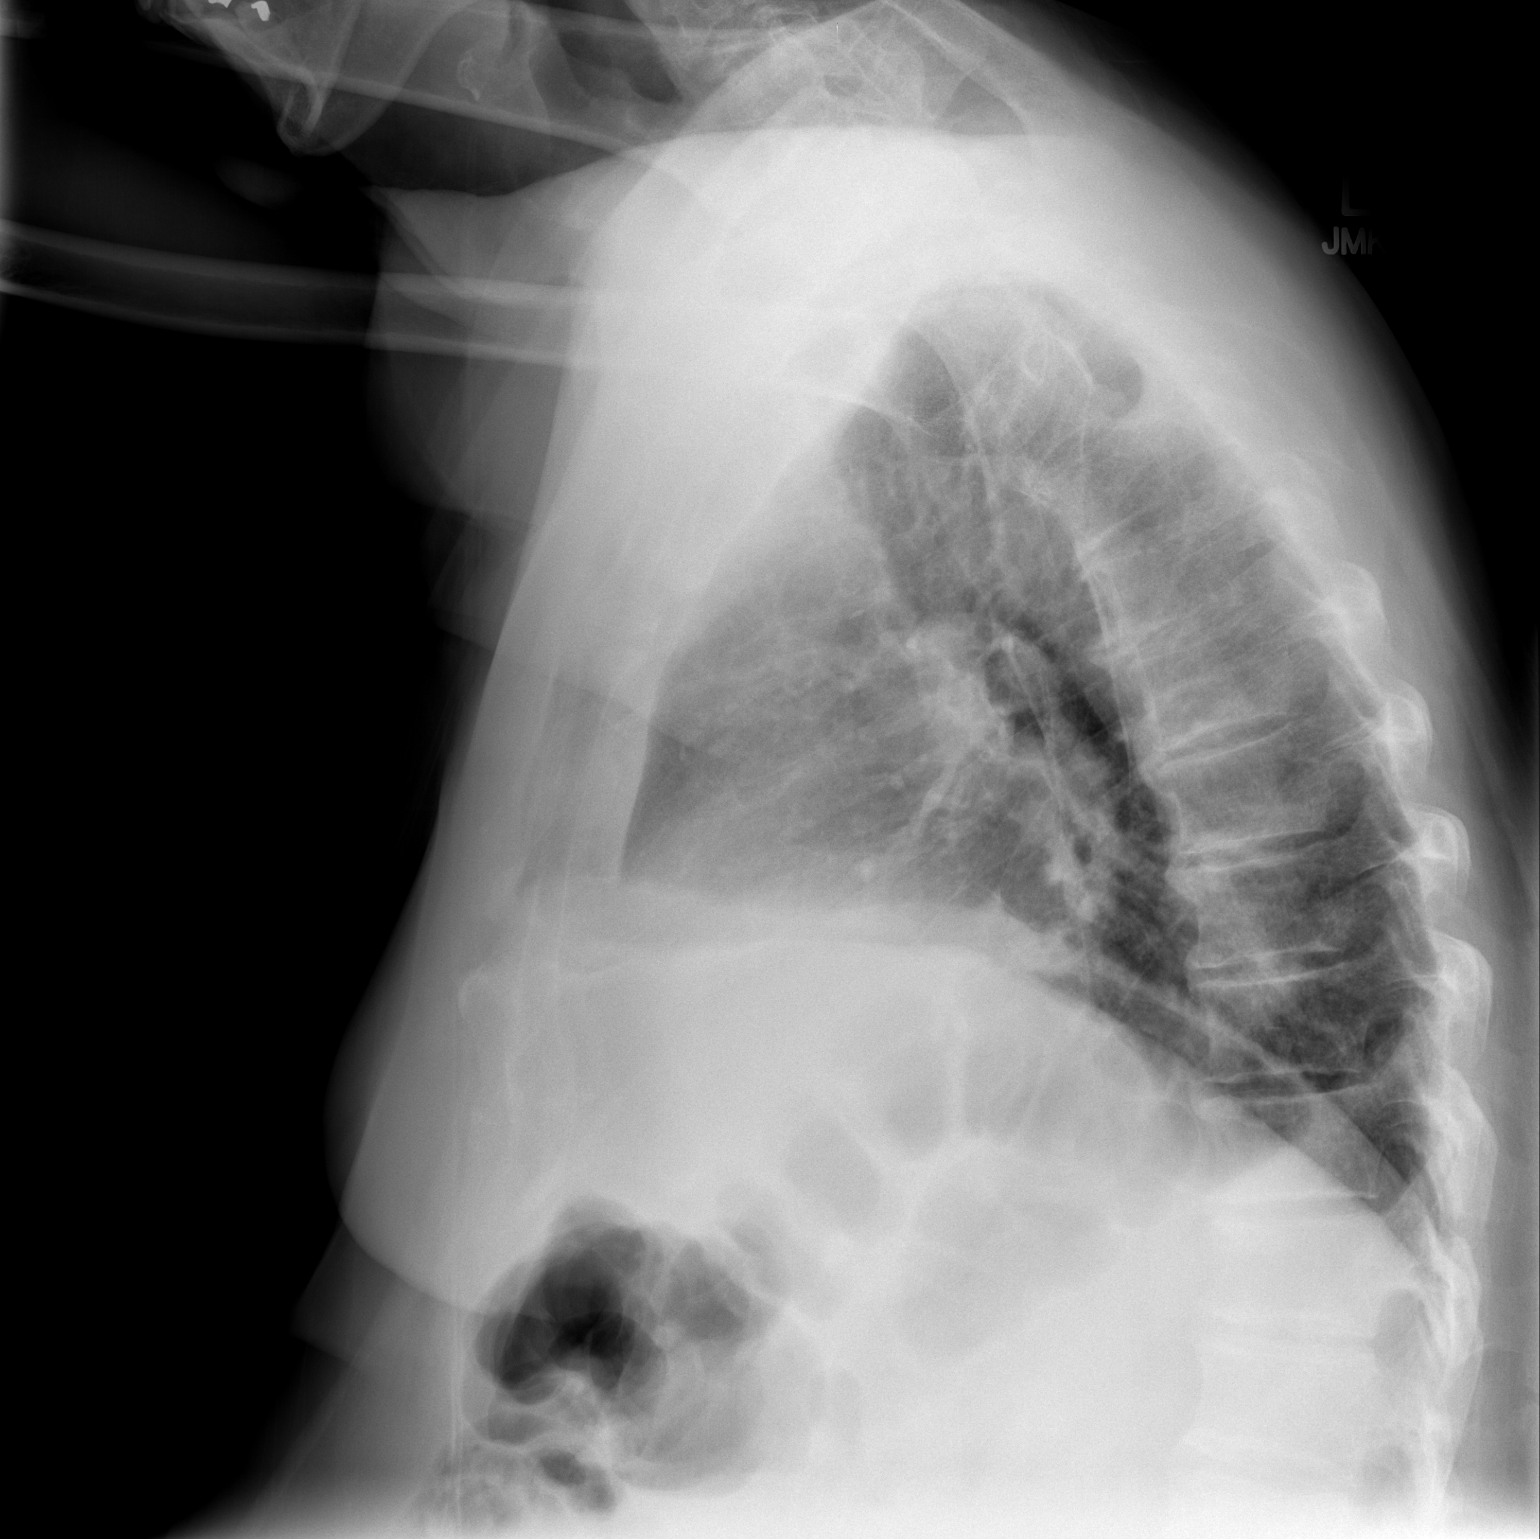

[2 of 2 positions shown; findings below may reference images not displayed]

FINDINGS: Lung volumes are low.  No definite consolidative airspace
disease.  No pleural effusions.  Pulmonary vasculature and the
cardiomediastinal silhouette are within normal limits allowing for
the low lung volumes and patient rotation to the left.
Atherosclerosis in the thoracic aorta.
IMPRESSION: 1.  Low lung volumes without radiographic evidence of acute
cardiopulmonary disease.
2.  Atherosclerosis.

## 2014-09-21 DIAGNOSIS — Z7901 Long term (current) use of anticoagulants: Secondary | ICD-10-CM | POA: Diagnosis not present

## 2014-09-21 DIAGNOSIS — H3532 Exudative age-related macular degeneration: Secondary | ICD-10-CM | POA: Diagnosis not present

## 2014-11-19 DIAGNOSIS — H5021 Vertical strabismus, right eye: Secondary | ICD-10-CM | POA: Diagnosis not present

## 2014-11-19 DIAGNOSIS — Z961 Presence of intraocular lens: Secondary | ICD-10-CM | POA: Diagnosis not present

## 2014-11-19 DIAGNOSIS — H3532 Exudative age-related macular degeneration: Secondary | ICD-10-CM | POA: Diagnosis not present

## 2014-11-19 DIAGNOSIS — H1851 Endothelial corneal dystrophy: Secondary | ICD-10-CM | POA: Diagnosis not present

## 2014-11-23 ENCOUNTER — Other Ambulatory Visit: Payer: Self-pay | Admitting: Cardiology

## 2014-11-23 NOTE — Telephone Encounter (Signed)
Rx request sent to pharmacy.  

## 2014-11-27 ENCOUNTER — Other Ambulatory Visit: Payer: Self-pay | Admitting: Cardiology

## 2014-11-27 NOTE — Telephone Encounter (Signed)
Rx has been sent to the pharmacy electronically. ° °

## 2014-12-02 ENCOUNTER — Other Ambulatory Visit: Payer: Self-pay | Admitting: Cardiology

## 2014-12-02 NOTE — Telephone Encounter (Signed)
Rx has been sent to the pharmacy electronically. ° °

## 2014-12-10 ENCOUNTER — Encounter: Payer: Self-pay | Admitting: Cardiology

## 2014-12-10 ENCOUNTER — Ambulatory Visit (INDEPENDENT_AMBULATORY_CARE_PROVIDER_SITE_OTHER): Payer: Medicare Other | Admitting: Cardiology

## 2014-12-10 VITALS — BP 130/82 | HR 70 | Ht 74.0 in | Wt 209.5 lb

## 2014-12-10 DIAGNOSIS — I10 Essential (primary) hypertension: Secondary | ICD-10-CM | POA: Diagnosis not present

## 2014-12-10 DIAGNOSIS — I2510 Atherosclerotic heart disease of native coronary artery without angina pectoris: Secondary | ICD-10-CM | POA: Diagnosis not present

## 2014-12-10 DIAGNOSIS — E785 Hyperlipidemia, unspecified: Secondary | ICD-10-CM

## 2014-12-10 MED ORDER — METOPROLOL SUCCINATE ER 50 MG PO TB24: 50.0000 mg | ORAL_TABLET | Freq: Every day | ORAL | Status: DC

## 2014-12-10 MED ORDER — RAMIPRIL 10 MG PO CAPS: 10.0000 mg | ORAL_CAPSULE | Freq: Every day | ORAL | Status: DC

## 2014-12-10 NOTE — Patient Instructions (Signed)
Continue your current therapy  I will see you in 6-8 months. 

## 2014-12-10 NOTE — Progress Notes (Signed)
[name] Date of Birth: Mar 05, 1941 Medical Record #[id number]  History of Present Illness: [name] is seen today for followup CAD. He has a known history of coronary disease with a myocardial infarction in 2007. Cardiac catheterization at that time demonstrated occlusion of the mid left circumflex coronary. He had a 50-70% stenosis in the mid LAD that was eccentric. He had a Myoview study in June of 2013 that showed an area of scar in the inferior lateral wall. This is unchanged compared to 2009. He continues to do very while from a cardiac standpoint with no recurrent symptoms of chest pain or shortness of breath. He walks everyday. He has lost an additional 17 lbs recently with dietary modification. He does report a "mini stroke" last November with swallowing difficulty. MRI showed no acute change but there were chronic ischemic changes in the right parietal lobe that were new since 2013. He was switched from ASA to Plavix.   Current Outpatient Prescriptions on File Prior to Visit  Medication Sig Dispense Refill  . Multiple Vitamin (MULTIVITAMIN) tablet Take 1 tablet by mouth daily.    . Naproxen Sodium (ALEVE PO) Take by mouth daily.    . simvastatin (ZOCOR) 80 MG tablet TAKE 1 TABLET AT BEDTIME 90 tablet 3   No current facility-administered medications on file prior to visit.    Allergies  Allergen Reactions  . Unasyn [Ampicillin-Sulbactam Sodium] Rash    Past Medical History  Diagnosis Date  . Dyslipidemia   . Overweight(278.02)     with body mass index of 29  . HTN (hypertension)   . CAD (coronary artery disease) 2007    occlusion of mid LCX, 50-70% LAD  . Myocardial infarction 1/07    Acute non-ST-elevation myocardial infarction  . Anginal pain   . Choledocholithiasis 02/14/2012  . Acute cholecystitis 02/14/2012  . Macular degeneration     Past Surgical History  Procedure Laterality Date  . Cardiac catheterization  03/17/2005    Ejection fraction is  estimated at 55%  . Cataract extraction w/ intraocular lens  implant, bilateral  ~ 2010  . Knee surgery  1980's    "took knee out and put it back in" ; left   . Cholecystectomy  02/15/2012    Procedure: LAPAROSCOPIC CHOLECYSTECTOMY WITH INTRAOPERATIVE CHOLANGIOGRAM;  Surgeon: Stark Klein, MD;  Location: [facility];  Service: General;  Laterality: N/A;    History  Smoking status  . Never Smoker   Smokeless tobacco  . Never Used    History  Alcohol Use No    Comment: 12/05/2011 "stopped all alcohol > 25 years ago; never drank heavily"    Family History  Problem Relation Age of Onset  . Heart disease Father   . Cancer Mother     breast  . Cancer Maternal Aunt     breast    Review of Systems: As noted in history of present illness.  All other systems were reviewed and are negative.  Physical Exam: BP 130/82 mmHg  Pulse 70  Ht 6\' 2"  (1.88 m)  Wt 95.029 kg (209 lb 8 oz)  BMI 26.89 kg/m2 WDWM in NAD.  The HEENT exam is normal.  The carotids are 2+ without bruits.  There is no thyromegaly.  There is no JVD.  The lungs are clear.   The heart exam reveals a regular rate with a normal S1 and S2.  There are no murmurs, gallops, or rubs.  The PMI is not displaced.   Abdominal exam  reveals good bowel sounds.    There is no hepatosplenomegaly or tenderness.  There are no masses.  Exam of the legs reveal no clubbing, cyanosis, or edema.  The legs are without rashes.  The distal pulses are intact.  Cranial nerves II - XII are intact.  Motor and sensory functions are intact.  The gait is normal.  LABORATORY DATA: Ecg: today- NSR with normal Ecg. Rate 70. I have personally reviewed and interpreted this study.   Assessment / Plan:  1. Coronary disease with history of occlusion of the mid left circumflex coronary. He remains asymptomatic. Myoview study in June of 2013 was unchanged. We'll continue current therapy with Plavix, statin, and beta blocker therapy.  2. Hypertension-well-controlled.  Continue ACE inhibitor and beta blocker therapy.  3. Dyslipidemia patient is on high-dose statin therapy. Lipids followed by Dr. Osborne Casco.  4. CVA now on Plavx. Still has some swallowing difficulty but has adjusted well.

## 2014-12-22 DIAGNOSIS — Z961 Presence of intraocular lens: Secondary | ICD-10-CM | POA: Diagnosis not present

## 2014-12-22 DIAGNOSIS — Z7902 Long term (current) use of antithrombotics/antiplatelets: Secondary | ICD-10-CM | POA: Diagnosis not present

## 2014-12-22 DIAGNOSIS — Z8249 Family history of ischemic heart disease and other diseases of the circulatory system: Secondary | ICD-10-CM | POA: Diagnosis not present

## 2014-12-22 DIAGNOSIS — H179 Unspecified corneal scar and opacity: Secondary | ICD-10-CM | POA: Diagnosis not present

## 2014-12-22 DIAGNOSIS — H04123 Dry eye syndrome of bilateral lacrimal glands: Secondary | ICD-10-CM | POA: Diagnosis not present

## 2014-12-22 DIAGNOSIS — H43813 Vitreous degeneration, bilateral: Secondary | ICD-10-CM | POA: Diagnosis not present

## 2014-12-22 DIAGNOSIS — Z79899 Other long term (current) drug therapy: Secondary | ICD-10-CM | POA: Diagnosis not present

## 2014-12-22 DIAGNOSIS — L908 Other atrophic disorders of skin: Secondary | ICD-10-CM | POA: Diagnosis not present

## 2014-12-22 DIAGNOSIS — Z83518 Family history of other specified eye disorder: Secondary | ICD-10-CM | POA: Diagnosis not present

## 2014-12-22 DIAGNOSIS — H353232 Exudative age-related macular degeneration, bilateral, with inactive choroidal neovascularization: Secondary | ICD-10-CM | POA: Diagnosis not present

## 2014-12-22 DIAGNOSIS — H1851 Endothelial corneal dystrophy: Secondary | ICD-10-CM | POA: Diagnosis not present

## 2015-01-04 DIAGNOSIS — I252 Old myocardial infarction: Secondary | ICD-10-CM | POA: Diagnosis not present

## 2015-01-04 DIAGNOSIS — I10 Essential (primary) hypertension: Secondary | ICD-10-CM | POA: Diagnosis not present

## 2015-01-04 DIAGNOSIS — R7302 Impaired glucose tolerance (oral): Secondary | ICD-10-CM | POA: Diagnosis not present

## 2015-01-04 DIAGNOSIS — I872 Venous insufficiency (chronic) (peripheral): Secondary | ICD-10-CM | POA: Diagnosis not present

## 2015-01-04 DIAGNOSIS — E7800 Pure hypercholesterolemia, unspecified: Secondary | ICD-10-CM | POA: Diagnosis not present

## 2015-01-04 DIAGNOSIS — E663 Overweight: Secondary | ICD-10-CM | POA: Diagnosis not present

## 2015-01-04 DIAGNOSIS — I679 Cerebrovascular disease, unspecified: Secondary | ICD-10-CM | POA: Diagnosis not present

## 2015-01-04 DIAGNOSIS — I2510 Atherosclerotic heart disease of native coronary artery without angina pectoris: Secondary | ICD-10-CM | POA: Diagnosis not present

## 2015-01-04 DIAGNOSIS — H3530 Unspecified macular degeneration: Secondary | ICD-10-CM | POA: Diagnosis not present

## 2015-01-04 DIAGNOSIS — Z6827 Body mass index (BMI) 27.0-27.9, adult: Secondary | ICD-10-CM | POA: Diagnosis not present

## 2015-01-04 DIAGNOSIS — Z23 Encounter for immunization: Secondary | ICD-10-CM | POA: Diagnosis not present

## 2015-03-30 DIAGNOSIS — H43813 Vitreous degeneration, bilateral: Secondary | ICD-10-CM | POA: Diagnosis not present

## 2015-03-30 DIAGNOSIS — H353232 Exudative age-related macular degeneration, bilateral, with inactive choroidal neovascularization: Secondary | ICD-10-CM | POA: Diagnosis not present

## 2015-03-30 DIAGNOSIS — H1851 Endothelial corneal dystrophy: Secondary | ICD-10-CM | POA: Diagnosis not present

## 2015-03-30 DIAGNOSIS — Z881 Allergy status to other antibiotic agents status: Secondary | ICD-10-CM | POA: Diagnosis not present

## 2015-03-30 DIAGNOSIS — Z7902 Long term (current) use of antithrombotics/antiplatelets: Secondary | ICD-10-CM | POA: Diagnosis not present

## 2015-03-30 DIAGNOSIS — Z961 Presence of intraocular lens: Secondary | ICD-10-CM | POA: Diagnosis not present

## 2015-06-29 DIAGNOSIS — Z888 Allergy status to other drugs, medicaments and biological substances status: Secondary | ICD-10-CM | POA: Diagnosis not present

## 2015-06-29 DIAGNOSIS — Z961 Presence of intraocular lens: Secondary | ICD-10-CM | POA: Diagnosis not present

## 2015-06-29 DIAGNOSIS — H43813 Vitreous degeneration, bilateral: Secondary | ICD-10-CM | POA: Diagnosis not present

## 2015-06-29 DIAGNOSIS — Z83518 Family history of other specified eye disorder: Secondary | ICD-10-CM | POA: Diagnosis not present

## 2015-06-29 DIAGNOSIS — Z8249 Family history of ischemic heart disease and other diseases of the circulatory system: Secondary | ICD-10-CM | POA: Diagnosis not present

## 2015-06-29 DIAGNOSIS — Z7902 Long term (current) use of antithrombotics/antiplatelets: Secondary | ICD-10-CM | POA: Diagnosis not present

## 2015-06-29 DIAGNOSIS — Z79899 Other long term (current) drug therapy: Secondary | ICD-10-CM | POA: Diagnosis not present

## 2015-06-29 DIAGNOSIS — H1851 Endothelial corneal dystrophy: Secondary | ICD-10-CM | POA: Diagnosis not present

## 2015-06-29 DIAGNOSIS — H353232 Exudative age-related macular degeneration, bilateral, with inactive choroidal neovascularization: Secondary | ICD-10-CM | POA: Diagnosis not present

## 2015-07-02 DIAGNOSIS — R7302 Impaired glucose tolerance (oral): Secondary | ICD-10-CM | POA: Diagnosis not present

## 2015-07-02 DIAGNOSIS — I10 Essential (primary) hypertension: Secondary | ICD-10-CM | POA: Diagnosis not present

## 2015-07-02 DIAGNOSIS — Z125 Encounter for screening for malignant neoplasm of prostate: Secondary | ICD-10-CM | POA: Diagnosis not present

## 2015-07-09 DIAGNOSIS — I2510 Atherosclerotic heart disease of native coronary artery without angina pectoris: Secondary | ICD-10-CM | POA: Diagnosis not present

## 2015-07-09 DIAGNOSIS — I10 Essential (primary) hypertension: Secondary | ICD-10-CM | POA: Diagnosis not present

## 2015-07-09 DIAGNOSIS — Z6828 Body mass index (BMI) 28.0-28.9, adult: Secondary | ICD-10-CM | POA: Diagnosis not present

## 2015-07-09 DIAGNOSIS — Z0000 Encounter for general adult medical examination without abnormal findings: Secondary | ICD-10-CM | POA: Diagnosis not present

## 2015-07-09 DIAGNOSIS — H3530 Unspecified macular degeneration: Secondary | ICD-10-CM | POA: Diagnosis not present

## 2015-07-09 DIAGNOSIS — R7302 Impaired glucose tolerance (oral): Secondary | ICD-10-CM | POA: Diagnosis not present

## 2015-07-09 DIAGNOSIS — I872 Venous insufficiency (chronic) (peripheral): Secondary | ICD-10-CM | POA: Diagnosis not present

## 2015-07-09 DIAGNOSIS — Z1389 Encounter for screening for other disorder: Secondary | ICD-10-CM | POA: Diagnosis not present

## 2015-07-09 DIAGNOSIS — E663 Overweight: Secondary | ICD-10-CM | POA: Diagnosis not present

## 2015-07-09 DIAGNOSIS — I679 Cerebrovascular disease, unspecified: Secondary | ICD-10-CM | POA: Diagnosis not present

## 2015-07-09 DIAGNOSIS — J309 Allergic rhinitis, unspecified: Secondary | ICD-10-CM | POA: Diagnosis not present

## 2015-07-09 DIAGNOSIS — I252 Old myocardial infarction: Secondary | ICD-10-CM | POA: Diagnosis not present

## 2015-11-02 ENCOUNTER — Other Ambulatory Visit: Payer: Self-pay | Admitting: Cardiovascular Disease

## 2015-11-02 NOTE — Telephone Encounter (Signed)
Rx(s) sent to pharmacy electronically.  

## 2015-11-09 DIAGNOSIS — Z881 Allergy status to other antibiotic agents status: Secondary | ICD-10-CM | POA: Diagnosis not present

## 2015-11-09 DIAGNOSIS — Z8249 Family history of ischemic heart disease and other diseases of the circulatory system: Secondary | ICD-10-CM | POA: Diagnosis not present

## 2015-11-09 DIAGNOSIS — Z961 Presence of intraocular lens: Secondary | ICD-10-CM | POA: Diagnosis not present

## 2015-11-09 DIAGNOSIS — Z79899 Other long term (current) drug therapy: Secondary | ICD-10-CM | POA: Diagnosis not present

## 2015-11-09 DIAGNOSIS — Z7902 Long term (current) use of antithrombotics/antiplatelets: Secondary | ICD-10-CM | POA: Diagnosis not present

## 2015-11-09 DIAGNOSIS — H43813 Vitreous degeneration, bilateral: Secondary | ICD-10-CM | POA: Diagnosis not present

## 2015-11-09 DIAGNOSIS — L908 Other atrophic disorders of skin: Secondary | ICD-10-CM | POA: Diagnosis not present

## 2015-11-09 DIAGNOSIS — Z83518 Family history of other specified eye disorder: Secondary | ICD-10-CM | POA: Diagnosis not present

## 2015-11-09 DIAGNOSIS — H1851 Endothelial corneal dystrophy: Secondary | ICD-10-CM | POA: Diagnosis not present

## 2015-11-09 DIAGNOSIS — H353232 Exudative age-related macular degeneration, bilateral, with inactive choroidal neovascularization: Secondary | ICD-10-CM | POA: Diagnosis not present

## 2015-11-09 DIAGNOSIS — Z888 Allergy status to other drugs, medicaments and biological substances status: Secondary | ICD-10-CM | POA: Diagnosis not present

## 2015-11-09 DIAGNOSIS — H04123 Dry eye syndrome of bilateral lacrimal glands: Secondary | ICD-10-CM | POA: Diagnosis not present

## 2015-11-24 DIAGNOSIS — H5021 Vertical strabismus, right eye: Secondary | ICD-10-CM | POA: Diagnosis not present

## 2015-11-24 DIAGNOSIS — Z961 Presence of intraocular lens: Secondary | ICD-10-CM | POA: Diagnosis not present

## 2015-11-24 DIAGNOSIS — H1851 Endothelial corneal dystrophy: Secondary | ICD-10-CM | POA: Diagnosis not present

## 2015-11-24 DIAGNOSIS — H353132 Nonexudative age-related macular degeneration, bilateral, intermediate dry stage: Secondary | ICD-10-CM | POA: Diagnosis not present

## 2015-11-24 DIAGNOSIS — H26492 Other secondary cataract, left eye: Secondary | ICD-10-CM | POA: Diagnosis not present

## 2015-11-29 ENCOUNTER — Other Ambulatory Visit: Payer: Self-pay | Admitting: Cardiology

## 2015-11-30 NOTE — Telephone Encounter (Signed)
Rx has been sent to the pharmacy electronically. ° °

## 2016-01-12 DIAGNOSIS — Z23 Encounter for immunization: Secondary | ICD-10-CM | POA: Diagnosis not present

## 2016-01-12 DIAGNOSIS — I679 Cerebrovascular disease, unspecified: Secondary | ICD-10-CM | POA: Diagnosis not present

## 2016-01-12 DIAGNOSIS — R7302 Impaired glucose tolerance (oral): Secondary | ICD-10-CM | POA: Diagnosis not present

## 2016-01-12 DIAGNOSIS — Z6829 Body mass index (BMI) 29.0-29.9, adult: Secondary | ICD-10-CM | POA: Diagnosis not present

## 2016-01-12 DIAGNOSIS — I252 Old myocardial infarction: Secondary | ICD-10-CM | POA: Diagnosis not present

## 2016-01-12 DIAGNOSIS — E7800 Pure hypercholesterolemia, unspecified: Secondary | ICD-10-CM | POA: Diagnosis not present

## 2016-01-12 DIAGNOSIS — I10 Essential (primary) hypertension: Secondary | ICD-10-CM | POA: Diagnosis not present

## 2016-01-12 DIAGNOSIS — I2510 Atherosclerotic heart disease of native coronary artery without angina pectoris: Secondary | ICD-10-CM | POA: Diagnosis not present

## 2016-01-12 DIAGNOSIS — E663 Overweight: Secondary | ICD-10-CM | POA: Diagnosis not present

## 2016-01-17 ENCOUNTER — Other Ambulatory Visit: Payer: Self-pay | Admitting: Cardiology

## 2016-01-31 ENCOUNTER — Other Ambulatory Visit: Payer: Self-pay | Admitting: Cardiology

## 2016-02-11 ENCOUNTER — Other Ambulatory Visit: Payer: Self-pay | Admitting: Cardiology

## 2016-02-17 ENCOUNTER — Encounter: Payer: Self-pay | Admitting: Cardiology

## 2016-02-19 NOTE — Progress Notes (Signed)
[name] Date of Birth: 06/14/1940 Medical Record [id number]  History of Present Illness: [name] is seen today for followup CAD. He has a known history of coronary disease with a myocardial infarction in 2007. Cardiac catheterization at that time demonstrated occlusion of the mid left circumflex coronary. He had a 50-70% stenosis in the mid LAD that was eccentric. He had a Myoview study in June of 2013 that showed an area of scar in the inferior lateral wall. This is unchanged compared to 2009.    He has a history of a "mini stroke"  November 2015 with swallowing difficulty. MRI showed no acute change but there were chronic ischemic changes in the right parietal lobe that were new since 2013. He was switched from ASA to Plavix.   He states he is doing very well. No chest pain or SOB. He is walking 3-4 times a week and working at his church. He has chronic swelling in his right leg- wears compression hose. He has macular degeneration. BP readings at home have been normal.   Current Outpatient Prescriptions on File Prior to Visit  Medication Sig Dispense Refill  . clopidogrel (PLAVIX) 75 MG tablet Take 1 tablet by mouth daily.    . metoprolol succinate (TOPROL-XL) 50 MG 24 hr tablet TAKE 1 TABLET (50 MG TOTAL) BY MOUTH DAILY. 30 tablet 0  . Multiple Vitamin (MULTIVITAMIN) tablet Take 1 tablet by mouth daily.    . Naproxen Sodium (ALEVE PO) Take by mouth daily.    . ramipril (ALTACE) 10 MG capsule TAKE 1 CAPSULE EVERY DAY 90 capsule 0  . simvastatin (ZOCOR) 80 MG tablet TAKE 1 TABLET AT BEDTIME 90 tablet 3   No current facility-administered medications on file prior to visit.     Allergies  Allergen Reactions  . Unasyn [Ampicillin-Sulbactam Sodium] Rash    Past Medical History:  Diagnosis Date  . Acute cholecystitis 02/14/2012  . Anginal pain ([place])   . CAD (coronary artery disease) 2007   occlusion of mid LCX, 50-70% LAD  . Choledocholithiasis 02/14/2012  .  Dyslipidemia   . HTN (hypertension)   . Macular degeneration   . Myocardial infarction 1/07   Acute non-ST-elevation myocardial infarction  . Overweight(278.02)    with body mass index of 29    Past Surgical History:  Procedure Laterality Date  . CARDIAC CATHETERIZATION  03/17/2005   Ejection fraction is estimated at 55%  . CATARACT EXTRACTION W/ INTRAOCULAR LENS  IMPLANT, BILATERAL  ~ 2010  . CHOLECYSTECTOMY  02/15/2012   Procedure: LAPAROSCOPIC CHOLECYSTECTOMY WITH INTRAOPERATIVE CHOLANGIOGRAM;  Surgeon: Stark Klein, MD;  Location: [place];  Service: General;  Laterality: N/A;  . KNEE SURGERY  1980's   "took knee out and put it back in" ; left     History  Smoking Status  . Never Smoker  Smokeless Tobacco  . Never Used    History  Alcohol Use No    Comment: 12/05/2011 "stopped all alcohol > 25 years ago; never drank heavily"    Family History  Problem Relation Age of Onset  . Heart disease Father   . Cancer Mother     breast  . Cancer Maternal Aunt     breast    Review of Systems: As noted in history of present illness.  All other systems were reviewed and are negative.  Physical Exam: BP (!) 150/84   Pulse 64   Ht 6\' 2"  (1.88 m)   Wt 223 lb 3.2 oz (101.2  kg)   BMI 28.66 kg/m  WDWM in NAD.  The HEENT exam is normal.  The carotids are 2+ without bruits.  There is no thyromegaly.  There is no JVD.  The lungs are clear.   The heart exam reveals a regular rate with a normal S1 and S2.  There are no murmurs, gallops, or rubs.  The PMI is not displaced.   Abdominal exam reveals good bowel sounds.    There is no hepatosplenomegaly or tenderness.  There are no masses.  Exam of the legs reveal 1-2+ edema right leg. The legs are without rashes.  The distal pulses are intact.  Cranial nerves II - XII are intact.  Motor and sensory functions are intact.  The gait is normal.  LABORATORY DATA: Ecg: today- NSR with normal Ecg. Rate 64. I have personally reviewed and interpreted  this study.  Labs dated 07/02/15: Cholesterol 122, triglycerides 101, HDL 35, LDL 67 Dated 01/12/16: A1c 5.2%, CMET and CBC normal.  Assessment / Plan:  1. Coronary disease with history of occlusion of the mid left circumflex coronary. He remains asymptomatic. Myoview study in June of 2013 was unchanged. We'll continue current therapy with Plavix, statin, and beta blocker therapy. I have recommended updating a Stress Myoview.  2. Hypertension-well-controlled per patient report. Continue ACE inhibitor and beta blocker therapy.  3. Dyslipidemia patient is on high-dose statin therapy. Well controlled.   4. CVA now on Plavx. Still has some chronic swallowing difficulty

## 2016-02-22 ENCOUNTER — Ambulatory Visit (INDEPENDENT_AMBULATORY_CARE_PROVIDER_SITE_OTHER): Payer: Medicare Other | Admitting: Cardiology

## 2016-02-22 ENCOUNTER — Encounter: Payer: Self-pay | Admitting: Cardiology

## 2016-02-22 VITALS — BP 150/84 | HR 64 | Ht 74.0 in | Wt 223.2 lb

## 2016-02-22 DIAGNOSIS — I2510 Atherosclerotic heart disease of native coronary artery without angina pectoris: Secondary | ICD-10-CM

## 2016-02-22 DIAGNOSIS — I10 Essential (primary) hypertension: Secondary | ICD-10-CM

## 2016-02-22 DIAGNOSIS — E785 Hyperlipidemia, unspecified: Secondary | ICD-10-CM | POA: Diagnosis not present

## 2016-02-22 NOTE — Patient Instructions (Signed)
Continue your current therapy  We will schedule you for a nuclear stress test  Otherwise I will see you in one year.

## 2016-03-08 ENCOUNTER — Other Ambulatory Visit: Payer: Self-pay | Admitting: Cardiology

## 2016-04-03 ENCOUNTER — Other Ambulatory Visit: Payer: Self-pay | Admitting: Cardiology

## 2016-04-11 DIAGNOSIS — Z961 Presence of intraocular lens: Secondary | ICD-10-CM | POA: Diagnosis not present

## 2016-04-11 DIAGNOSIS — H43813 Vitreous degeneration, bilateral: Secondary | ICD-10-CM | POA: Diagnosis not present

## 2016-04-11 DIAGNOSIS — Z880 Allergy status to penicillin: Secondary | ICD-10-CM | POA: Diagnosis not present

## 2016-04-11 DIAGNOSIS — H04123 Dry eye syndrome of bilateral lacrimal glands: Secondary | ICD-10-CM | POA: Diagnosis not present

## 2016-04-11 DIAGNOSIS — Z79899 Other long term (current) drug therapy: Secondary | ICD-10-CM | POA: Diagnosis not present

## 2016-04-11 DIAGNOSIS — I252 Old myocardial infarction: Secondary | ICD-10-CM | POA: Diagnosis not present

## 2016-04-11 DIAGNOSIS — H353232 Exudative age-related macular degeneration, bilateral, with inactive choroidal neovascularization: Secondary | ICD-10-CM | POA: Diagnosis not present

## 2016-04-11 DIAGNOSIS — H1851 Endothelial corneal dystrophy: Secondary | ICD-10-CM | POA: Diagnosis not present

## 2016-04-11 DIAGNOSIS — Z7902 Long term (current) use of antithrombotics/antiplatelets: Secondary | ICD-10-CM | POA: Diagnosis not present

## 2016-04-14 ENCOUNTER — Telehealth (HOSPITAL_COMMUNITY): Payer: Self-pay

## 2016-04-14 NOTE — Telephone Encounter (Signed)
Encounter complete. 

## 2016-04-19 ENCOUNTER — Ambulatory Visit (HOSPITAL_COMMUNITY)
Admission: RE | Admit: 2016-04-19 | Discharge: 2016-04-19 | Disposition: A | Discharge status: Home / Self Care | Payer: Medicare Other | Source: Ambulatory Visit | Attending: Cardiovascular Disease | Admitting: Cardiovascular Disease

## 2016-04-19 DIAGNOSIS — I10 Essential (primary) hypertension: Secondary | ICD-10-CM | POA: Insufficient documentation

## 2016-04-19 DIAGNOSIS — I2510 Atherosclerotic heart disease of native coronary artery without angina pectoris: Secondary | ICD-10-CM | POA: Diagnosis not present

## 2016-04-19 DIAGNOSIS — E785 Hyperlipidemia, unspecified: Secondary | ICD-10-CM | POA: Diagnosis not present

## 2016-04-19 DIAGNOSIS — I252 Old myocardial infarction: Secondary | ICD-10-CM | POA: Insufficient documentation

## 2016-04-19 DIAGNOSIS — R9439 Abnormal result of other cardiovascular function study: Secondary | ICD-10-CM | POA: Diagnosis not present

## 2016-04-19 LAB — MYOCARDIAL PERFUSION IMAGING
CHL CUP MPHR: 143 {beats}/min
CHL CUP NUCLEAR SDS: 8
CHL CUP NUCLEAR SRS: 8
CSEPED: 4 min
CSEPPHR: 146 {beats}/min
Estimated workload: 5.9 METS
Exercise duration (sec): 8 s
LV dias vol: 94 mL (ref 62–150)
LV sys vol: 39 mL
NUC STRESS TID: 1.2
Percent HR: 102 %
RPE: 17
Rest HR: 85 {beats}/min
SSS: 16

## 2016-04-19 MED ORDER — TECHNETIUM TC 99M TETROFOSMIN IV KIT
32.1000 | PACK | Freq: Once | INTRAVENOUS | Status: AC | PRN
  Administered 2016-04-19: 32.1 via INTRAVENOUS
  Filled 2016-04-19: qty 33

## 2016-04-19 MED ORDER — TECHNETIUM TC 99M TETROFOSMIN IV KIT
10.1000 | PACK | Freq: Once | INTRAVENOUS | Status: AC | PRN
  Administered 2016-04-19: 10.1 via INTRAVENOUS
  Filled 2016-04-19: qty 11

## 2016-06-05 ENCOUNTER — Other Ambulatory Visit: Payer: Self-pay | Admitting: Cardiology

## 2016-06-06 ENCOUNTER — Other Ambulatory Visit: Payer: Self-pay

## 2016-06-06 MED ORDER — RAMIPRIL 10 MG PO CAPS: 10.0000 mg | ORAL_CAPSULE | Freq: Every day | ORAL | 2 refills | Status: DC

## 2016-07-06 DIAGNOSIS — E7800 Pure hypercholesterolemia, unspecified: Secondary | ICD-10-CM | POA: Diagnosis not present

## 2016-07-06 DIAGNOSIS — R7302 Impaired glucose tolerance (oral): Secondary | ICD-10-CM | POA: Diagnosis not present

## 2016-07-06 DIAGNOSIS — Z125 Encounter for screening for malignant neoplasm of prostate: Secondary | ICD-10-CM | POA: Diagnosis not present

## 2016-07-06 DIAGNOSIS — I10 Essential (primary) hypertension: Secondary | ICD-10-CM | POA: Diagnosis not present

## 2016-07-13 DIAGNOSIS — J3089 Other allergic rhinitis: Secondary | ICD-10-CM | POA: Diagnosis not present

## 2016-07-13 DIAGNOSIS — Z6829 Body mass index (BMI) 29.0-29.9, adult: Secondary | ICD-10-CM | POA: Diagnosis not present

## 2016-07-13 DIAGNOSIS — I2510 Atherosclerotic heart disease of native coronary artery without angina pectoris: Secondary | ICD-10-CM | POA: Diagnosis not present

## 2016-07-13 DIAGNOSIS — Z1389 Encounter for screening for other disorder: Secondary | ICD-10-CM | POA: Diagnosis not present

## 2016-07-13 DIAGNOSIS — E7800 Pure hypercholesterolemia, unspecified: Secondary | ICD-10-CM | POA: Diagnosis not present

## 2016-07-13 DIAGNOSIS — I10 Essential (primary) hypertension: Secondary | ICD-10-CM | POA: Diagnosis not present

## 2016-07-13 DIAGNOSIS — Z0000 Encounter for general adult medical examination without abnormal findings: Secondary | ICD-10-CM | POA: Diagnosis not present

## 2016-07-13 DIAGNOSIS — R7302 Impaired glucose tolerance (oral): Secondary | ICD-10-CM | POA: Diagnosis not present

## 2016-07-13 DIAGNOSIS — I6789 Other cerebrovascular disease: Secondary | ICD-10-CM | POA: Diagnosis not present

## 2016-07-13 DIAGNOSIS — H3530 Unspecified macular degeneration: Secondary | ICD-10-CM | POA: Diagnosis not present

## 2016-07-13 DIAGNOSIS — I872 Venous insufficiency (chronic) (peripheral): Secondary | ICD-10-CM | POA: Diagnosis not present

## 2016-07-13 DIAGNOSIS — I252 Old myocardial infarction: Secondary | ICD-10-CM | POA: Diagnosis not present

## 2016-08-09 ENCOUNTER — Other Ambulatory Visit: Payer: Self-pay | Admitting: Cardiology

## 2016-08-10 NOTE — Telephone Encounter (Signed)
REFILL 

## 2016-08-29 DIAGNOSIS — Z961 Presence of intraocular lens: Secondary | ICD-10-CM | POA: Diagnosis not present

## 2016-08-29 DIAGNOSIS — H35362 Drusen (degenerative) of macula, left eye: Secondary | ICD-10-CM | POA: Diagnosis not present

## 2016-08-29 DIAGNOSIS — H04123 Dry eye syndrome of bilateral lacrimal glands: Secondary | ICD-10-CM | POA: Diagnosis not present

## 2016-08-29 DIAGNOSIS — H353232 Exudative age-related macular degeneration, bilateral, with inactive choroidal neovascularization: Secondary | ICD-10-CM | POA: Diagnosis not present

## 2016-08-29 DIAGNOSIS — H353212 Exudative age-related macular degeneration, right eye, with inactive choroidal neovascularization: Secondary | ICD-10-CM | POA: Diagnosis not present

## 2016-08-29 DIAGNOSIS — H1851 Endothelial corneal dystrophy: Secondary | ICD-10-CM | POA: Diagnosis not present

## 2016-09-25 IMAGING — MR MR HEAD WO/W CM
11 of 12 series · 42 of 48 positions shown · IV contrast (20ml multihance)
Comparison: CT head without contrast 02/16/2012. MRI brain
12/06/2011.

CLINICAL DATA: Diplopia. Difficulty swallowing. Speech complaints.

EXAM:
MRI HEAD WITHOUT AND WITH CONTRAST
TECHNIQUE: Multiplanar, multiecho pulse sequences of the brain and surrounding
structures were obtained without and with intravenous contrast.
CONTRAST:  20mL MULTIHANCE GADOBENATE DIMEGLUMINE 529 MG/ML IV SOLN

[Series 2: T1 · sagittal · 5.0mm · 0.45mm/px · 1 of 20 slices shown]
[im 1/20]
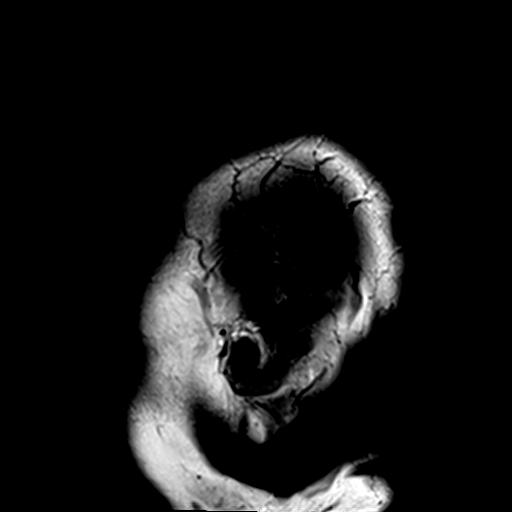

[Series 3: DWI · axial · 5.0mm · 1.80mm/px · z∈[-80,+57]mm · 4 of 44 slices shown (1 of 4)]
[im 1/44]
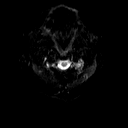
[im 15/44]
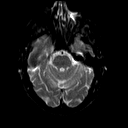
[im 29/44]
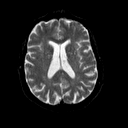
[im 44/44]
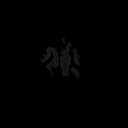

[Series 4: DWI · axial · 5.0mm · 1.80mm/px · z∈[-80,+57]mm · 2 of 22 slices shown (2 of 4)]
[im 1/22]
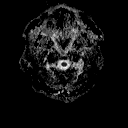
[im 22/22]
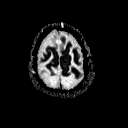

[Series 6: swi_images · axial · 2.0mm · 0.90mm/px · z∈[-91,+67]mm · 8 of 80 slices shown]
[im 1/80]
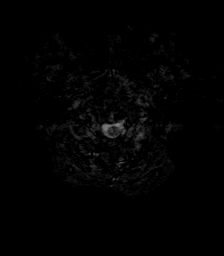
[im 12/80]
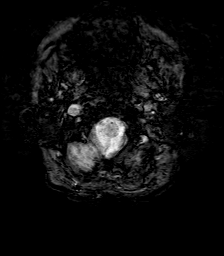
[im 23/80]
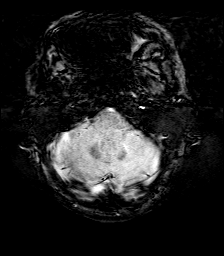
[im 34/80]
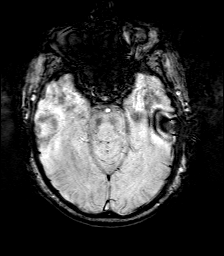
[im 46/80]
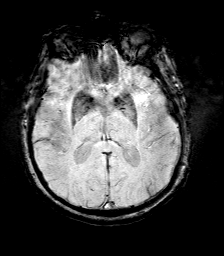
[im 57/80]
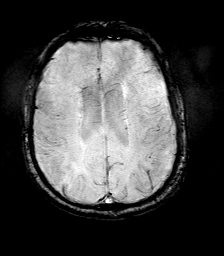
[im 68/80]
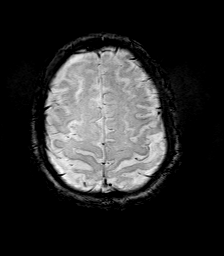
[im 80/80]
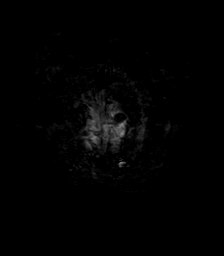

[Series 7: DWI · coronal · 5.0mm · 1.80mm/px · 6 of 60 slices shown (3 of 4)]
[im 1/60]
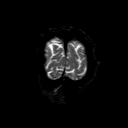
[im 12/60]
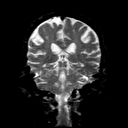
[im 24/60]
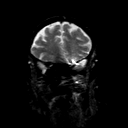
[im 36/60]
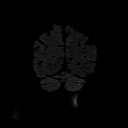
[im 48/60]
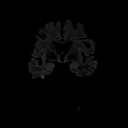
[im 60/60]
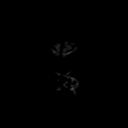

[Series 8: DWI · coronal · 5.0mm · 1.80mm/px · 3 of 30 slices shown (4 of 4)]
[im 1/30]
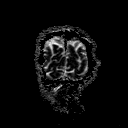
[im 15/30]
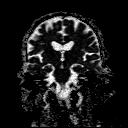
[im 30/30]
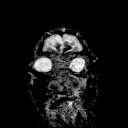

[Series 9: T2 · axial · 5.0mm · 0.45mm/px · z∈[-70,+60]mm · 2 of 21 slices shown (1 of 2)]
[im 1/21]
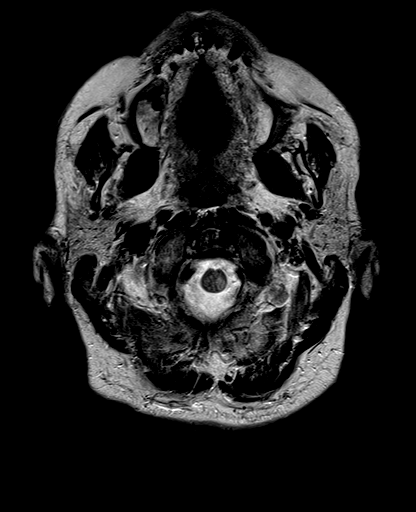
[im 21/21]
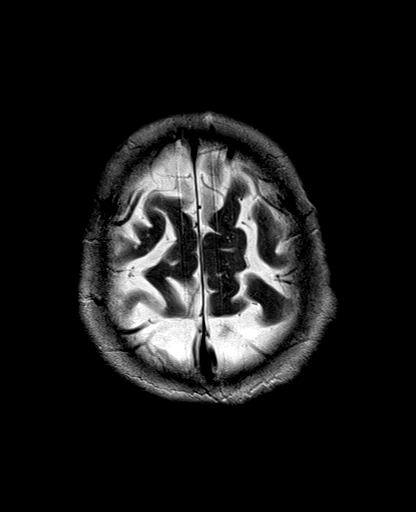

[Series 10: FLAIR · axial · 5.0mm · 0.45mm/px · z∈[-70,+60]mm · 2 of 21 slices shown]
[im 1/21]
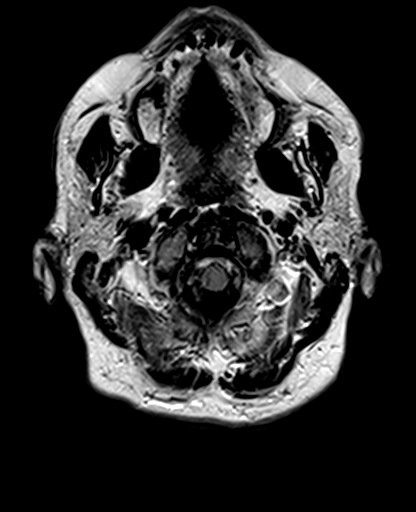
[im 21/21]
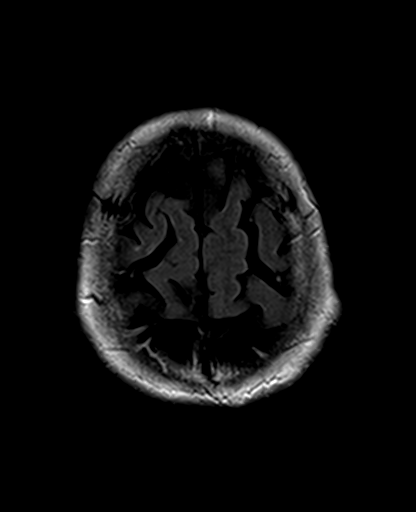

[Series 11: mpr tra · axial · 2.0mm · 0.45mm/px · z∈[-84,+74]mm · 8 of 80 slices shown]
[im 1/80]
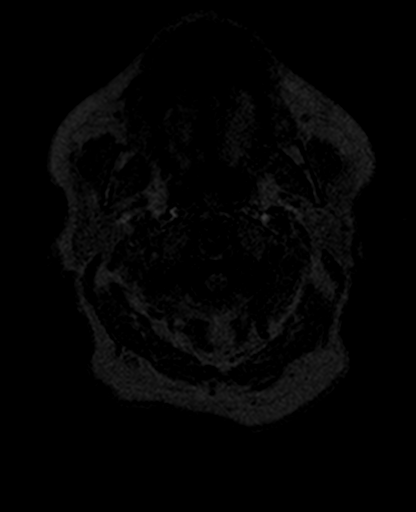
[im 12/80]
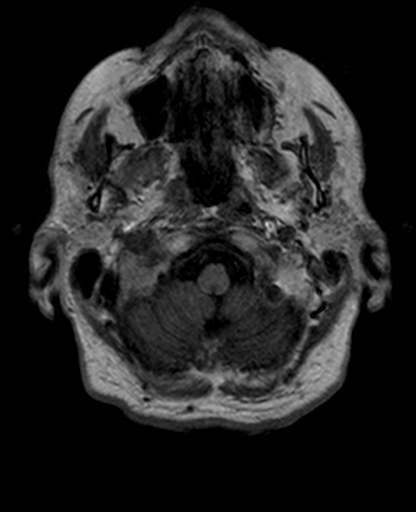
[im 23/80]
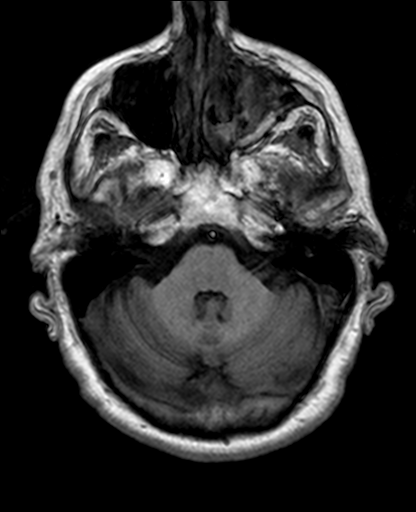
[im 34/80]
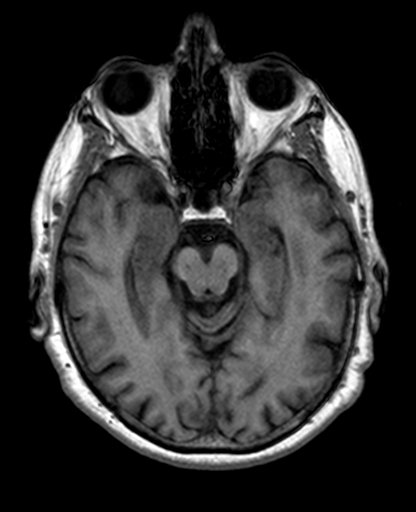
[im 46/80]
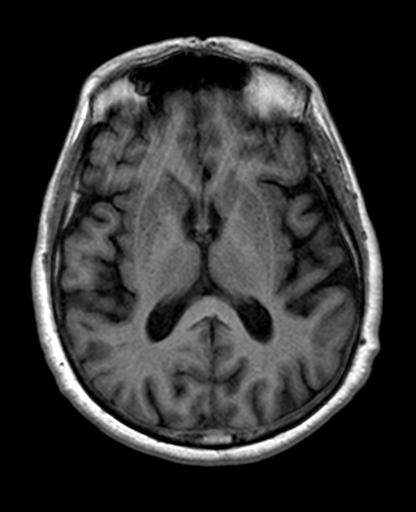
[im 57/80]
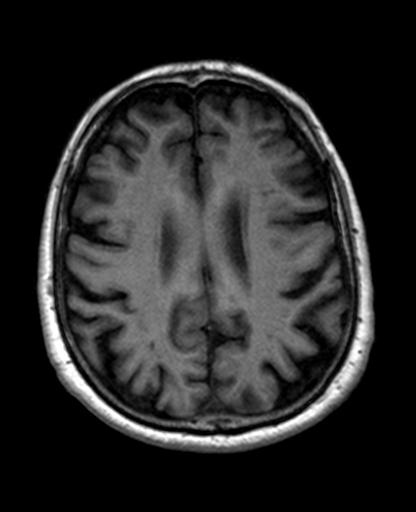
[im 68/80]
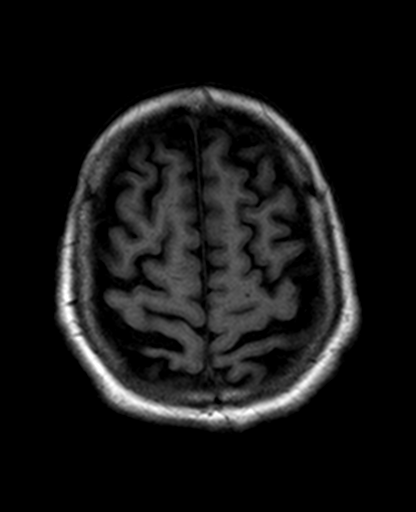
[im 80/80]
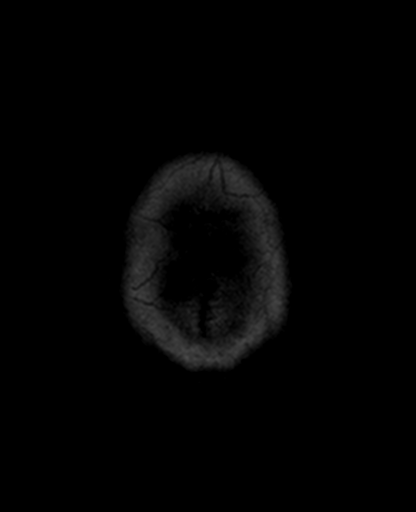

[Series 12: T2 · coronal · 5.0mm · 0.45mm/px · 2 of 22 slices shown (2 of 2)]
[im 1/22]
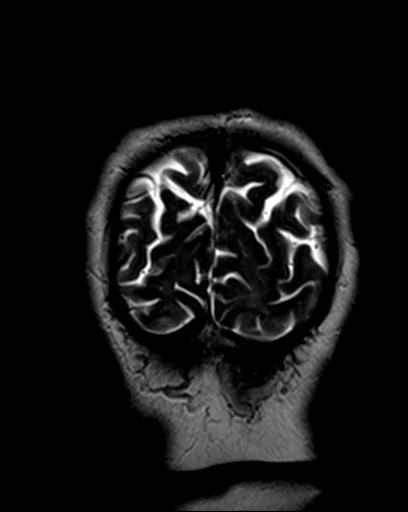
[im 22/22]
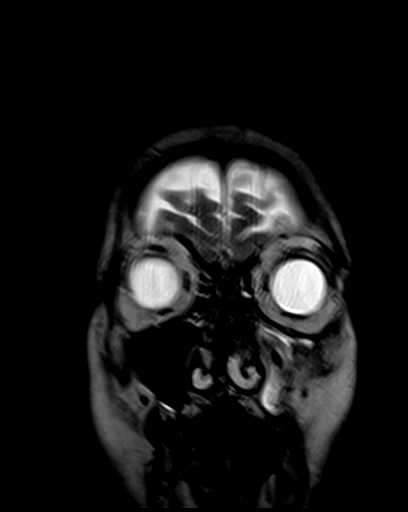

[Series 13: post mpr tra · axial · 2.0mm · 0.45mm/px · z∈[-84,-18]mm · 4 of 80 slices shown]
[im 1/80]
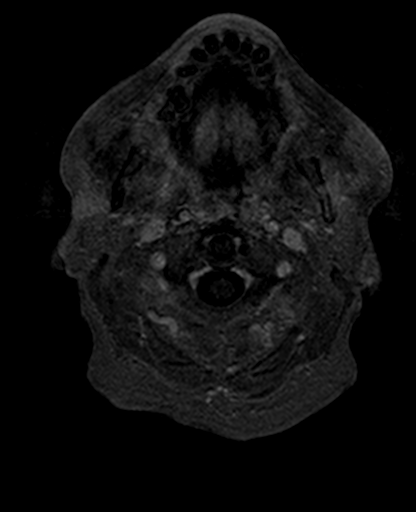
[im 12/80]
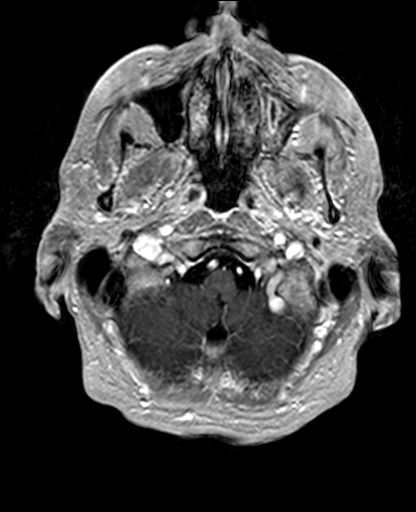
[im 23/80]
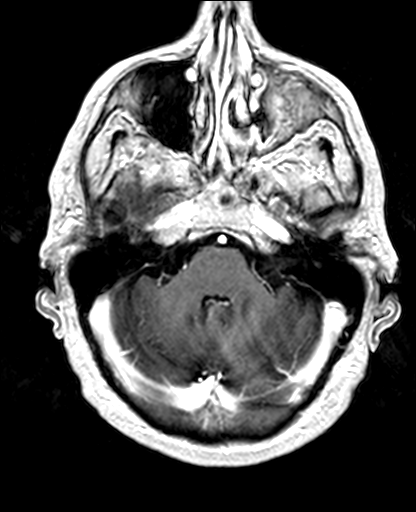
[im 34/80]
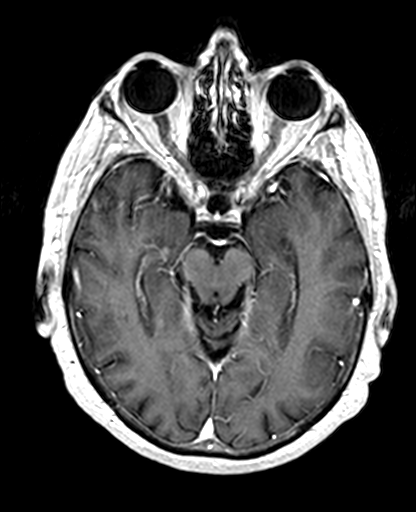

[42 of 48 positions shown; findings below may reference images not displayed]

FINDINGS: The diffusion-weighted images demonstrate no acute or subacute
infarct. T2 changes in the high right parietal lobe are new since
the prior study, reflecting antrum ischemic changes. Periventricular
white matter disease and minimal subcortical change otherwise is
stable. No acute hemorrhage or mass lesion is present. The
ventricles are proportionate to mild atrophy. Flow is present in the
major intracranial arteries. No significant extraaxial fluid
collection is present.

The patient is status post bilateral lens replacements. The orbits
are otherwise intact. Chronic left maxillary sinus opacification is
present. The left sinuses shrunken with resection of the left cheek.
No obstructing mass lesion is evident. Diffuse mucosal thickening is
evident.

Postcontrast images demonstrate no pathologic enhancement.
IMPRESSION: 1. No acute intracranial abnormality.
2. Chronic ischemic changes in the high right parietal lobe are new
since the prior study, compatible with interval ischemia.
3. Chronic left maxillary sinus disease with a shrunken sinus.

## 2016-12-20 ENCOUNTER — Other Ambulatory Visit: Payer: Self-pay | Admitting: Cardiology

## 2016-12-26 DIAGNOSIS — Z961 Presence of intraocular lens: Secondary | ICD-10-CM | POA: Diagnosis not present

## 2016-12-26 DIAGNOSIS — H1851 Endothelial corneal dystrophy: Secondary | ICD-10-CM | POA: Diagnosis not present

## 2016-12-26 DIAGNOSIS — H353122 Nonexudative age-related macular degeneration, left eye, intermediate dry stage: Secondary | ICD-10-CM | POA: Diagnosis not present

## 2016-12-26 DIAGNOSIS — H353211 Exudative age-related macular degeneration, right eye, with active choroidal neovascularization: Secondary | ICD-10-CM | POA: Diagnosis not present

## 2017-01-03 ENCOUNTER — Other Ambulatory Visit: Payer: Self-pay | Admitting: Cardiology

## 2017-01-16 DIAGNOSIS — Z9842 Cataract extraction status, left eye: Secondary | ICD-10-CM | POA: Diagnosis not present

## 2017-01-16 DIAGNOSIS — H35723 Serous detachment of retinal pigment epithelium, bilateral: Secondary | ICD-10-CM | POA: Diagnosis not present

## 2017-01-16 DIAGNOSIS — H353232 Exudative age-related macular degeneration, bilateral, with inactive choroidal neovascularization: Secondary | ICD-10-CM | POA: Diagnosis not present

## 2017-01-16 DIAGNOSIS — H43813 Vitreous degeneration, bilateral: Secondary | ICD-10-CM | POA: Diagnosis not present

## 2017-01-16 DIAGNOSIS — H1851 Endothelial corneal dystrophy: Secondary | ICD-10-CM | POA: Diagnosis not present

## 2017-01-16 DIAGNOSIS — H04123 Dry eye syndrome of bilateral lacrimal glands: Secondary | ICD-10-CM | POA: Diagnosis not present

## 2017-01-16 DIAGNOSIS — Z961 Presence of intraocular lens: Secondary | ICD-10-CM | POA: Diagnosis not present

## 2017-01-16 DIAGNOSIS — Z9841 Cataract extraction status, right eye: Secondary | ICD-10-CM | POA: Diagnosis not present

## 2017-01-16 DIAGNOSIS — Z880 Allergy status to penicillin: Secondary | ICD-10-CM | POA: Diagnosis not present

## 2017-01-23 DIAGNOSIS — I2510 Atherosclerotic heart disease of native coronary artery without angina pectoris: Secondary | ICD-10-CM | POA: Diagnosis not present

## 2017-01-23 DIAGNOSIS — E663 Overweight: Secondary | ICD-10-CM | POA: Diagnosis not present

## 2017-01-23 DIAGNOSIS — I10 Essential (primary) hypertension: Secondary | ICD-10-CM | POA: Diagnosis not present

## 2017-01-23 DIAGNOSIS — I252 Old myocardial infarction: Secondary | ICD-10-CM | POA: Diagnosis not present

## 2017-01-23 DIAGNOSIS — Z6827 Body mass index (BMI) 27.0-27.9, adult: Secondary | ICD-10-CM | POA: Diagnosis not present

## 2017-01-23 DIAGNOSIS — I6789 Other cerebrovascular disease: Secondary | ICD-10-CM | POA: Diagnosis not present

## 2017-01-23 DIAGNOSIS — Z23 Encounter for immunization: Secondary | ICD-10-CM | POA: Diagnosis not present

## 2017-01-23 DIAGNOSIS — E7800 Pure hypercholesterolemia, unspecified: Secondary | ICD-10-CM | POA: Diagnosis not present

## 2017-01-23 DIAGNOSIS — R7302 Impaired glucose tolerance (oral): Secondary | ICD-10-CM | POA: Diagnosis not present

## 2017-01-23 DIAGNOSIS — J3089 Other allergic rhinitis: Secondary | ICD-10-CM | POA: Diagnosis not present

## 2017-01-23 DIAGNOSIS — H3530 Unspecified macular degeneration: Secondary | ICD-10-CM | POA: Diagnosis not present

## 2017-01-23 DIAGNOSIS — I872 Venous insufficiency (chronic) (peripheral): Secondary | ICD-10-CM | POA: Diagnosis not present

## 2017-02-21 ENCOUNTER — Other Ambulatory Visit: Payer: Self-pay | Admitting: Cardiology

## 2017-02-22 NOTE — Telephone Encounter (Signed)
Rx(s) sent to pharmacy electronically.  

## 2017-03-07 DIAGNOSIS — I209 Angina pectoris, unspecified: Secondary | ICD-10-CM | POA: Insufficient documentation

## 2017-03-07 DIAGNOSIS — H3530 Unspecified macular degeneration: Secondary | ICD-10-CM | POA: Insufficient documentation

## 2017-03-08 ENCOUNTER — Other Ambulatory Visit: Payer: Self-pay | Admitting: Cardiology

## 2017-03-22 NOTE — Progress Notes (Signed)
Belia Heman Date of Birth: 1940/09/08 Medical Record #[id number]  History of Present Illness: Mr. [name] is seen today for followup CAD. He has a known history of coronary disease with a myocardial infarction in 2007. Cardiac catheterization at that time demonstrated occlusion of the mid left circumflex coronary. He had a 50-70% stenosis in the mid LAD that was eccentric. He had a Myoview study in January 2018 that showed a defect in the inferior lateral wall. This was unchanged compared to prior studies.   He has a history of a "mini stroke"  November 2015 with swallowing difficulty. MRI showed no acute change but there were chronic ischemic changes in the right parietal lobe that were new since 2013. He was switched from ASA to Plavix.   On follow up today he is doing very well. He denies any chest pain or SOB. 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 He has macular degeneration. BP readings at home have been normal. He has lost 14 lbs this year.   Current Outpatient Medications on File Prior to Visit  Medication Sig Dispense Refill  . clopidogrel (PLAVIX) 75 MG tablet Take 1 tablet by mouth daily.    . metoprolol succinate (TOPROL-XL) 50 MG 24 hr tablet Take 1 tablet (50 mg total) by mouth daily. 90 tablet 1  . Multiple Vitamin (MULTIVITAMIN) tablet Take 1 tablet by mouth daily.    . Naproxen Sodium (ALEVE PO) Take by mouth daily.    . ramipril (ALTACE) 10 MG capsule TAKE 1 CAPSULE EVERY DAY 90 capsule 0  . simvastatin (ZOCOR) 80 MG tablet Take 1 tablet (80 mg total) by mouth daily at 6 PM. 90 tablet 1   No current facility-administered medications on file prior to visit.     Allergies  Allergen Reactions  . Unasyn [Ampicillin-Sulbactam Sodium] Rash    Past Medical History:  Diagnosis Date  . Acute cholecystitis 02/14/2012  . Anginal pain ([place])   . CAD (coronary artery disease) 2007   occlusion of mid LCX, 50-70% LAD  .  Choledocholithiasis 02/14/2012  . Dyslipidemia   . HTN (hypertension)   . Macular degeneration   . Myocardial infarction ([place]) 1/07   Acute non-ST-elevation myocardial infarction  . Overweight(278.02)    with body mass index of 29    Past Surgical History:  Procedure Laterality Date  . CARDIAC CATHETERIZATION  03/17/2005   Ejection fraction is estimated at 55%  . CATARACT EXTRACTION W/ INTRAOCULAR LENS  IMPLANT, BILATERAL  ~ 2010  . CHOLECYSTECTOMY  02/15/2012   Procedure: LAPAROSCOPIC CHOLECYSTECTOMY WITH INTRAOPERATIVE CHOLANGIOGRAM;  Surgeon: Stark Klein, MD;  Location: [place];  Service: General;  Laterality: N/A;  . KNEE SURGERY  1980's   "took knee out and put it back in" ; left     Social History   Tobacco Use  Smoking Status Never Smoker  Smokeless Tobacco Never Used    Social History   Substance and Sexual Activity  Alcohol Use No   Comment: 12/05/2011 "stopped all alcohol > 25 years ago; never drank heavily"    Family History  Problem Relation Age of Onset  . Heart disease Father   . Cancer Mother        breast  . Cancer Maternal Aunt        breast    Review of Systems: As noted in history of present illness.  All other systems were reviewed and are negative.  Physical Exam: BP Marland Kitchen)  176/90   Pulse 81   Ht 6\' 2"  (1.88 m)   Wt 209 lb 9.6 oz (95.1 kg)   BMI 26.91 kg/m  GENERAL:  Well appearing WM in NAD HEENT:  PERRL, EOMI, sclera are clear. Oropharynx is clear. NECK:  No jugular venous distention, carotid upstroke brisk and symmetric, no bruits, no thyromegaly or adenopathy LUNGS:  Clear to auscultation bilaterally CHEST:  Unremarkable HEART:  RRR,  PMI not displaced or sustained,S1 and S2 within normal limits, no S3, no S4: no clicks, no rubs, no murmurs ABD:  Soft, nontender. BS +, no masses or bruits. No hepatomegaly, no splenomegaly EXT:  2 + pulses throughout, no edema, no cyanosis no clubbing SKIN:  Warm and dry.  No rashes NEURO:  Alert and  oriented x 3. Cranial nerves II through XII intact. PSYCH:  Cognitively intact    LABORATORY DATA: Ecg: today- NSR with normal Ecg. Rate 81. I have personally reviewed and interpreted this study.  Labs dated 07/02/15: Cholesterol 122, triglycerides 101, HDL 35, LDL 67 Dated 01/12/16: A1c 5.2%, CMET and CBC normal. Dated 07/06/16: cholesterol 120, triglycerides 78, HDL 40, LDL 64. Hgb  16.2 Dated 01/23/17: A1c 5.4%. Normal chemistries.  Myoview 04/19/16: Study Highlights    The left ventricular ejection fraction is normal (55-65%).  Nuclear stress EF: 59%.  There was no ST segment deviation noted during stress.  No T wave inversion was noted during stress.  Blood pressure demonstrated a normal response to exercise.  Defect 1: There is a small defect of severe severity present in the basal inferolateral and mid inferolateral location.  Findings consistent with prior myocardial infarction with peri-infarct ischemia.  This is an intermediate risk study.    Assessment / Plan:  1. Coronary disease with history of occlusion of the mid left circumflex coronary. He remains asymptomatic. Myoview study in June of 2018 was unchanged from prior studies. We'll continue current therapy with Plavix, statin, and beta blocker therapy.   2. Hypertension-elevated today but well-controlled at home per patient report. Continue ACE inhibitor and beta blocker therapy.  3. Dyslipidemia patient is on high-dose statin therapy. Well controlled.   4. CVA  on Plavx. No recurrence  Follow up in one year.

## 2017-03-27 ENCOUNTER — Encounter: Payer: Self-pay | Admitting: Cardiology

## 2017-03-27 ENCOUNTER — Ambulatory Visit (INDEPENDENT_AMBULATORY_CARE_PROVIDER_SITE_OTHER): Payer: Medicare Other | Admitting: Cardiology

## 2017-03-27 VITALS — BP 176/90 | HR 81 | Ht 74.0 in | Wt 209.6 lb

## 2017-03-27 DIAGNOSIS — E785 Hyperlipidemia, unspecified: Secondary | ICD-10-CM

## 2017-03-27 DIAGNOSIS — H35362 Drusen (degenerative) of macula, left eye: Secondary | ICD-10-CM | POA: Diagnosis not present

## 2017-03-27 DIAGNOSIS — I10 Essential (primary) hypertension: Secondary | ICD-10-CM | POA: Diagnosis not present

## 2017-03-27 DIAGNOSIS — I2510 Atherosclerotic heart disease of native coronary artery without angina pectoris: Secondary | ICD-10-CM | POA: Diagnosis not present

## 2017-03-27 DIAGNOSIS — H353232 Exudative age-related macular degeneration, bilateral, with inactive choroidal neovascularization: Secondary | ICD-10-CM | POA: Diagnosis not present

## 2017-03-27 NOTE — Addendum Note (Signed)
Addended by: Kathyrn Lass on: 03/27/2017 09:47 AM   Modules accepted: Orders

## 2017-03-27 NOTE — Patient Instructions (Addendum)
Continue your current therapy  I will see you in one year   

## 2017-05-01 DIAGNOSIS — Z961 Presence of intraocular lens: Secondary | ICD-10-CM | POA: Diagnosis not present

## 2017-05-01 DIAGNOSIS — H353232 Exudative age-related macular degeneration, bilateral, with inactive choroidal neovascularization: Secondary | ICD-10-CM | POA: Diagnosis not present

## 2017-05-09 ENCOUNTER — Other Ambulatory Visit: Payer: Self-pay | Admitting: Cardiology

## 2017-05-09 NOTE — Telephone Encounter (Signed)
REFILL 

## 2017-06-26 DIAGNOSIS — H353232 Exudative age-related macular degeneration, bilateral, with inactive choroidal neovascularization: Secondary | ICD-10-CM | POA: Diagnosis not present

## 2017-06-26 DIAGNOSIS — Z961 Presence of intraocular lens: Secondary | ICD-10-CM | POA: Diagnosis not present

## 2017-06-26 DIAGNOSIS — H35052 Retinal neovascularization, unspecified, left eye: Secondary | ICD-10-CM | POA: Diagnosis not present

## 2017-07-04 DIAGNOSIS — H43813 Vitreous degeneration, bilateral: Secondary | ICD-10-CM | POA: Diagnosis not present

## 2017-07-04 DIAGNOSIS — H353232 Exudative age-related macular degeneration, bilateral, with inactive choroidal neovascularization: Secondary | ICD-10-CM | POA: Diagnosis not present

## 2017-07-04 DIAGNOSIS — H1851 Endothelial corneal dystrophy: Secondary | ICD-10-CM | POA: Diagnosis not present

## 2017-07-04 DIAGNOSIS — Z79899 Other long term (current) drug therapy: Secondary | ICD-10-CM | POA: Diagnosis not present

## 2017-07-04 DIAGNOSIS — Z961 Presence of intraocular lens: Secondary | ICD-10-CM | POA: Diagnosis not present

## 2017-07-11 ENCOUNTER — Other Ambulatory Visit: Payer: Self-pay | Admitting: Cardiology

## 2017-07-13 DIAGNOSIS — R7302 Impaired glucose tolerance (oral): Secondary | ICD-10-CM | POA: Diagnosis not present

## 2017-07-13 DIAGNOSIS — E7800 Pure hypercholesterolemia, unspecified: Secondary | ICD-10-CM | POA: Diagnosis not present

## 2017-07-13 DIAGNOSIS — I10 Essential (primary) hypertension: Secondary | ICD-10-CM | POA: Diagnosis not present

## 2017-07-13 DIAGNOSIS — Z125 Encounter for screening for malignant neoplasm of prostate: Secondary | ICD-10-CM | POA: Diagnosis not present

## 2017-07-13 DIAGNOSIS — R82998 Other abnormal findings in urine: Secondary | ICD-10-CM | POA: Diagnosis not present

## 2017-07-20 DIAGNOSIS — I252 Old myocardial infarction: Secondary | ICD-10-CM | POA: Diagnosis not present

## 2017-07-20 DIAGNOSIS — Z1389 Encounter for screening for other disorder: Secondary | ICD-10-CM | POA: Diagnosis not present

## 2017-07-20 DIAGNOSIS — I872 Venous insufficiency (chronic) (peripheral): Secondary | ICD-10-CM | POA: Diagnosis not present

## 2017-07-20 DIAGNOSIS — J309 Allergic rhinitis, unspecified: Secondary | ICD-10-CM | POA: Diagnosis not present

## 2017-07-20 DIAGNOSIS — Z6827 Body mass index (BMI) 27.0-27.9, adult: Secondary | ICD-10-CM | POA: Diagnosis not present

## 2017-07-20 DIAGNOSIS — R7302 Impaired glucose tolerance (oral): Secondary | ICD-10-CM | POA: Diagnosis not present

## 2017-07-20 DIAGNOSIS — I10 Essential (primary) hypertension: Secondary | ICD-10-CM | POA: Diagnosis not present

## 2017-07-20 DIAGNOSIS — Z0000 Encounter for general adult medical examination without abnormal findings: Secondary | ICD-10-CM | POA: Diagnosis not present

## 2017-07-20 DIAGNOSIS — H353232 Exudative age-related macular degeneration, bilateral, with inactive choroidal neovascularization: Secondary | ICD-10-CM | POA: Diagnosis not present

## 2017-07-20 DIAGNOSIS — I6789 Other cerebrovascular disease: Secondary | ICD-10-CM | POA: Diagnosis not present

## 2017-07-20 DIAGNOSIS — H3530 Unspecified macular degeneration: Secondary | ICD-10-CM | POA: Diagnosis not present

## 2017-07-20 DIAGNOSIS — E7800 Pure hypercholesterolemia, unspecified: Secondary | ICD-10-CM | POA: Diagnosis not present

## 2017-09-11 DIAGNOSIS — H353232 Exudative age-related macular degeneration, bilateral, with inactive choroidal neovascularization: Secondary | ICD-10-CM | POA: Diagnosis not present

## 2017-09-11 DIAGNOSIS — H11153 Pinguecula, bilateral: Secondary | ICD-10-CM | POA: Diagnosis not present

## 2017-09-11 DIAGNOSIS — Z881 Allergy status to other antibiotic agents status: Secondary | ICD-10-CM | POA: Diagnosis not present

## 2017-09-11 DIAGNOSIS — H43813 Vitreous degeneration, bilateral: Secondary | ICD-10-CM | POA: Diagnosis not present

## 2017-09-11 DIAGNOSIS — Z961 Presence of intraocular lens: Secondary | ICD-10-CM | POA: Diagnosis not present

## 2017-10-11 DIAGNOSIS — H0101B Ulcerative blepharitis left eye, upper and lower eyelids: Secondary | ICD-10-CM | POA: Diagnosis not present

## 2017-10-11 DIAGNOSIS — H353232 Exudative age-related macular degeneration, bilateral, with inactive choroidal neovascularization: Secondary | ICD-10-CM | POA: Diagnosis not present

## 2017-10-11 DIAGNOSIS — H0101A Ulcerative blepharitis right eye, upper and lower eyelids: Secondary | ICD-10-CM | POA: Diagnosis not present

## 2017-10-11 DIAGNOSIS — H43813 Vitreous degeneration, bilateral: Secondary | ICD-10-CM | POA: Diagnosis not present

## 2017-10-11 DIAGNOSIS — H04123 Dry eye syndrome of bilateral lacrimal glands: Secondary | ICD-10-CM | POA: Diagnosis not present

## 2017-10-11 DIAGNOSIS — Z961 Presence of intraocular lens: Secondary | ICD-10-CM | POA: Diagnosis not present

## 2017-11-15 DIAGNOSIS — Z961 Presence of intraocular lens: Secondary | ICD-10-CM | POA: Diagnosis not present

## 2017-11-15 DIAGNOSIS — H353232 Exudative age-related macular degeneration, bilateral, with inactive choroidal neovascularization: Secondary | ICD-10-CM | POA: Diagnosis not present

## 2018-01-31 DIAGNOSIS — H35723 Serous detachment of retinal pigment epithelium, bilateral: Secondary | ICD-10-CM | POA: Diagnosis not present

## 2018-01-31 DIAGNOSIS — H353232 Exudative age-related macular degeneration, bilateral, with inactive choroidal neovascularization: Secondary | ICD-10-CM | POA: Diagnosis not present

## 2018-01-31 DIAGNOSIS — Z961 Presence of intraocular lens: Secondary | ICD-10-CM | POA: Diagnosis not present

## 2018-02-04 DIAGNOSIS — I872 Venous insufficiency (chronic) (peripheral): Secondary | ICD-10-CM | POA: Diagnosis not present

## 2018-02-04 DIAGNOSIS — Z23 Encounter for immunization: Secondary | ICD-10-CM | POA: Diagnosis not present

## 2018-02-04 DIAGNOSIS — E7800 Pure hypercholesterolemia, unspecified: Secondary | ICD-10-CM | POA: Diagnosis not present

## 2018-02-04 DIAGNOSIS — E663 Overweight: Secondary | ICD-10-CM | POA: Diagnosis not present

## 2018-02-04 DIAGNOSIS — H353232 Exudative age-related macular degeneration, bilateral, with inactive choroidal neovascularization: Secondary | ICD-10-CM | POA: Diagnosis not present

## 2018-02-04 DIAGNOSIS — I10 Essential (primary) hypertension: Secondary | ICD-10-CM | POA: Diagnosis not present

## 2018-02-04 DIAGNOSIS — I6789 Other cerebrovascular disease: Secondary | ICD-10-CM | POA: Diagnosis not present

## 2018-02-04 DIAGNOSIS — R7302 Impaired glucose tolerance (oral): Secondary | ICD-10-CM | POA: Diagnosis not present

## 2018-02-04 DIAGNOSIS — I2510 Atherosclerotic heart disease of native coronary artery without angina pectoris: Secondary | ICD-10-CM | POA: Diagnosis not present

## 2018-02-04 DIAGNOSIS — J3089 Other allergic rhinitis: Secondary | ICD-10-CM | POA: Diagnosis not present

## 2018-02-04 DIAGNOSIS — Z6829 Body mass index (BMI) 29.0-29.9, adult: Secondary | ICD-10-CM | POA: Diagnosis not present

## 2018-02-04 DIAGNOSIS — I252 Old myocardial infarction: Secondary | ICD-10-CM | POA: Diagnosis not present

## 2018-02-11 ENCOUNTER — Other Ambulatory Visit: Payer: Self-pay | Admitting: Cardiology

## 2018-02-27 ENCOUNTER — Other Ambulatory Visit: Payer: Self-pay | Admitting: Cardiology

## 2018-04-04 DIAGNOSIS — H1851 Endothelial corneal dystrophy: Secondary | ICD-10-CM | POA: Diagnosis not present

## 2018-04-04 DIAGNOSIS — H43813 Vitreous degeneration, bilateral: Secondary | ICD-10-CM | POA: Diagnosis not present

## 2018-04-04 DIAGNOSIS — Z961 Presence of intraocular lens: Secondary | ICD-10-CM | POA: Diagnosis not present

## 2018-04-04 DIAGNOSIS — H0101B Ulcerative blepharitis left eye, upper and lower eyelids: Secondary | ICD-10-CM | POA: Diagnosis not present

## 2018-04-04 DIAGNOSIS — H0101A Ulcerative blepharitis right eye, upper and lower eyelids: Secondary | ICD-10-CM | POA: Diagnosis not present

## 2018-04-04 DIAGNOSIS — H353232 Exudative age-related macular degeneration, bilateral, with inactive choroidal neovascularization: Secondary | ICD-10-CM | POA: Diagnosis not present

## 2018-04-07 NOTE — Progress Notes (Signed)
[name] Date of Birth: 06/01/1940 Medical Record #[id number]  History of Present Illness: [name] is seen today for followup CAD. He has a known history of coronary disease with a myocardial infarction in 2007. Cardiac catheterization at that time demonstrated occlusion of the mid left circumflex coronary. He had a 50-70% stenosis in the mid LAD that was eccentric. He had a Myoview study in January 2018 that showed a defect in the inferior lateral wall. This was unchanged compared to prior studies.   He has a history of a "mini stroke"  November 2015 with swallowing difficulty. MRI showed no acute change but there were chronic ischemic changes in the right parietal lobe that were new since 2013. He was switched from ASA to Plavix.   On follow up today he is doing very well. He denies any chest pain or SOB. He walks daily and is actively engaged in volunteer work.  BP readings at home have been normal typically 120-130/70. No palpitations or dizziness.   Current Outpatient Medications on File Prior to Visit  Medication Sig Dispense Refill  . clopidogrel (PLAVIX) 75 MG tablet Take 1 tablet by mouth daily.    . metoprolol succinate (TOPROL-XL) 50 MG 24 hr tablet TAKE 1 TABLET EVERY DAY 90 tablet 3  . Multiple Vitamin (MULTIVITAMIN) tablet Take 1 tablet by mouth daily.    . Naproxen Sodium (ALEVE PO) Take by mouth daily.    . ramipril (ALTACE) 10 MG capsule Take 1 capsule (10 mg total) by mouth daily. Please keep upcoming appt in January for future refills. Thank you 90 capsule 0  . simvastatin (ZOCOR) 80 MG tablet TAKE 1 TABLET EVERY DAY  AT  6PM 90 tablet 3   No current facility-administered medications on file prior to visit.     Allergies  Allergen Reactions  . Unasyn [Ampicillin-Sulbactam Sodium] Rash    Past Medical History:  Diagnosis Date  . Acute cholecystitis 02/14/2012  . Anginal pain ([place])   . CAD (coronary artery disease) 2007   occlusion of mid LCX, 50-70%  LAD  . Choledocholithiasis 02/14/2012  . Dyslipidemia   . HTN (hypertension)   . Macular degeneration   . Myocardial infarction ([place]) 1/07   Acute non-ST-elevation myocardial infarction  . Overweight(278.02)    with body mass index of 29    Past Surgical History:  Procedure Laterality Date  . CARDIAC CATHETERIZATION  03/17/2005   Ejection fraction is estimated at 55%  . CATARACT EXTRACTION W/ INTRAOCULAR LENS  IMPLANT, BILATERAL  ~ 2010  . CHOLECYSTECTOMY  02/15/2012   Procedure: LAPAROSCOPIC CHOLECYSTECTOMY WITH INTRAOPERATIVE CHOLANGIOGRAM;  Surgeon: Stark Klein, MD;  Location: [place];  Service: General;  Laterality: N/A;  . KNEE SURGERY  1980's   "took knee out and put it back in" ; left     Social History   Tobacco Use  Smoking Status Never Smoker  Smokeless Tobacco Never Used    Social History   Substance and Sexual Activity  Alcohol Use No   Comment: 12/05/2011 "stopped all alcohol > 25 years ago; never drank heavily"    Family History  Problem Relation Age of Onset  . Heart disease Father   . Cancer Mother        breast  . Cancer Maternal Aunt        breast    Review of Systems: As noted in history of present illness.  All other systems were reviewed and are negative.  Physical Exam: There were  no vitals taken for this visit. GENERAL:  Well appearing WM in NAD HEENT:  PERRL, EOMI, sclera are clear. Oropharynx is clear. NECK:  No jugular venous distention, carotid upstroke brisk and symmetric, no bruits, no thyromegaly or adenopathy LUNGS:  Clear to auscultation bilaterally CHEST:  Unremarkable HEART:  RRR,  PMI not displaced or sustained,S1 and S2 within normal limits, no S3, no S4: no clicks, no rubs, no murmurs ABD:  Soft, nontender. BS +, no masses or bruits. No hepatomegaly, no splenomegaly EXT:  2 + pulses throughout, no edema, no cyanosis no clubbing SKIN:  Warm and dry.  No rashes NEURO:  Alert and oriented x 3. Cranial nerves II through XII  intact. PSYCH:  Cognitively intact    LABORATORY DATA: Ecg: today- NSR with normal Ecg. Rate 65. Occ. PAC.  I have personally reviewed and interpreted this study.  Labs dated 07/02/15: Cholesterol 122, triglycerides 101, HDL 35, LDL 67 Dated 01/12/16: A1c 5.2%, CMET and CBC normal. Dated 07/06/16: cholesterol 120, triglycerides 78, HDL 40, LDL 64. Hgb  16.2 Dated 01/23/17: A1c 5.4%. Normal chemistries. Dated 02/04/18: cholesterol 126, triglycerides 117, HDL 42, LDL 61. A1c 5.5%. Creatinine 1.1. Hgb and ALT normal  Myoview 04/19/16: Study Highlights    The left ventricular ejection fraction is normal (55-65%).  Nuclear stress EF: 59%.  There was no ST segment deviation noted during stress.  No T wave inversion was noted during stress.  Blood pressure demonstrated a normal response to exercise.  Defect 1: There is a small defect of severe severity present in the basal inferolateral and mid inferolateral location.  Findings consistent with prior myocardial infarction with peri-infarct ischemia.  This is an intermediate risk study.    Assessment / Plan:  1. Coronary disease with history of occlusion of the mid left circumflex coronary in 2007. He is asymptomatic. Myoview study in June of 2018 was unchanged from prior studies. We'll continue current therapy with Plavix, statin, and beta blocker therapy.   2. Hypertension-elevated today but well-controlled at home. Continue ACE inhibitor and beta blocker therapy.  3. Dyslipidemia patient is on high-dose statin therapy. Well controlled.   4. CVA  on Plavx. No recurrence  Follow up in one year.

## 2018-04-11 ENCOUNTER — Ambulatory Visit (INDEPENDENT_AMBULATORY_CARE_PROVIDER_SITE_OTHER): Payer: Medicare Other | Admitting: Cardiology

## 2018-04-11 ENCOUNTER — Encounter: Payer: Self-pay | Admitting: Cardiology

## 2018-04-11 VITALS — BP 164/87 | HR 65 | Ht 74.0 in | Wt 215.4 lb

## 2018-04-11 DIAGNOSIS — I10 Essential (primary) hypertension: Secondary | ICD-10-CM

## 2018-04-11 DIAGNOSIS — E785 Hyperlipidemia, unspecified: Secondary | ICD-10-CM

## 2018-04-11 DIAGNOSIS — I2510 Atherosclerotic heart disease of native coronary artery without angina pectoris: Secondary | ICD-10-CM

## 2018-04-11 NOTE — Addendum Note (Signed)
Addended by: Kathyrn Lass on: 04/11/2018 08:09 AM   Modules accepted: Orders

## 2018-04-22 ENCOUNTER — Encounter (HOSPITAL_COMMUNITY): Payer: Self-pay | Admitting: *Deleted

## 2018-04-22 ENCOUNTER — Emergency Department (HOSPITAL_COMMUNITY)
Admission: EM | Admit: 2018-04-22 | Discharge: 2018-04-22 | Disposition: A | Discharge status: Home / Self Care | Payer: Medicare Other | Attending: Emergency Medicine | Admitting: Emergency Medicine

## 2018-04-22 DIAGNOSIS — Z79899 Other long term (current) drug therapy: Secondary | ICD-10-CM | POA: Insufficient documentation

## 2018-04-22 DIAGNOSIS — Z7902 Long term (current) use of antithrombotics/antiplatelets: Secondary | ICD-10-CM | POA: Diagnosis not present

## 2018-04-22 DIAGNOSIS — N179 Acute kidney failure, unspecified: Secondary | ICD-10-CM

## 2018-04-22 DIAGNOSIS — I252 Old myocardial infarction: Secondary | ICD-10-CM | POA: Diagnosis not present

## 2018-04-22 DIAGNOSIS — K625 Hemorrhage of anus and rectum: Secondary | ICD-10-CM | POA: Diagnosis not present

## 2018-04-22 DIAGNOSIS — I2510 Atherosclerotic heart disease of native coronary artery without angina pectoris: Secondary | ICD-10-CM | POA: Insufficient documentation

## 2018-04-22 DIAGNOSIS — I10 Essential (primary) hypertension: Secondary | ICD-10-CM | POA: Diagnosis not present

## 2018-04-22 LAB — COMPREHENSIVE METABOLIC PANEL
ALT: 25 U/L (ref 0–44)
AST: 27 U/L (ref 15–41)
Albumin: 4.2 g/dL (ref 3.5–5.0)
Alkaline Phosphatase: 66 U/L (ref 38–126)
Anion gap: 16 — ABNORMAL HIGH (ref 5–15)
BUN: 24 mg/dL — ABNORMAL HIGH (ref 8–23)
CO2: 21 mmol/L — ABNORMAL LOW (ref 22–32)
Calcium: 8.8 mg/dL — ABNORMAL LOW (ref 8.9–10.3)
Chloride: 103 mmol/L (ref 98–111)
Creatinine, Ser: 1.68 mg/dL — ABNORMAL HIGH (ref 0.61–1.24)
GFR calc Af Amer: 44 mL/min — ABNORMAL LOW (ref >60)
GFR calc non Af Amer: 38 mL/min — ABNORMAL LOW (ref >60)
Glucose, Bld: 112 mg/dL — ABNORMAL HIGH (ref 70–99)
Potassium: 4.2 mmol/L (ref 3.5–5.1)
Sodium: 140 mmol/L (ref 135–145)
Total Bilirubin: 1.7 mg/dL — ABNORMAL HIGH (ref 0.3–1.2)
Total Protein: 7.3 g/dL (ref 6.5–8.1)

## 2018-04-22 LAB — CBC
HCT: 53.8 % — ABNORMAL HIGH (ref 39.0–52.0)
Hemoglobin: 17.8 g/dL — ABNORMAL HIGH (ref 13.0–17.0)
MCH: 30.4 pg (ref 26.0–34.0)
MCHC: 33.1 g/dL (ref 30.0–36.0)
MCV: 91.8 fL (ref 80.0–100.0)
Platelets: 231 10*3/uL (ref 150–400)
RBC: 5.86 MIL/uL — ABNORMAL HIGH (ref 4.22–5.81)
RDW: 13 % (ref 11.5–15.5)
WBC: 7.9 10*3/uL (ref 4.0–10.5)
nRBC: 0 % (ref 0.0–0.2)

## 2018-04-22 LAB — POC OCCULT BLOOD, ED: Fecal Occult Bld: POSITIVE — AB

## 2018-04-22 NOTE — ED Notes (Signed)
Per blood bank pt has a positive antibody screen, if the provider anticipates need for blood transfusion notify blood bank ASAP

## 2018-04-22 NOTE — Discharge Instructions (Addendum)
Return to the ED if you have black, tarry stools or experienced any chest pain, shortness of breath or any worsening signs of bleeding.  Increase your oral hydration.  Follow-up with your primary care doctor as discussed in the next day or 2 for repeat hemoglobin, kidney function, rectal exam.  Follow-up with GI, numbers given below.

## 2018-04-22 NOTE — ED Notes (Signed)
ED Provider at bedside. 

## 2018-04-22 NOTE — ED Triage Notes (Signed)
Pt in c/o GI bleeding x1 this morning, denies pain at his rectum, denies abdominal pain, no distress noted

## 2018-04-22 NOTE — ED Provider Notes (Signed)
[facility] EMERGENCY DEPARTMENT Provider Note   CSN: [id number] Arrival date & time: 04/22/18  1345     History   Chief Complaint Chief Complaint  Patient presents with  . GI Bleeding    HPI [name] is a 78 y.o. male.  The history is provided by the patient.  Rectal Bleeding  Quality:  Bright red Amount:  Scant Timing:  Sporadic Chronicity:  New Context: hemorrhoids (hx in past)   Context: not anal fissures, not anal penetration, not constipation, not defecation, not foreign body, not rectal injury and not rectal pain   Relieved by:  Nothing Worsened by:  Nothing Associated symptoms: no abdominal pain, no dizziness, no epistaxis, no fever, no hematemesis, no light-headedness, no loss of consciousness, no recent illness and no vomiting   Risk factors: anticoagulant use (plavix)     Past Medical History:  Diagnosis Date  . Acute cholecystitis 02/14/2012  . Anginal pain ([place])   . CAD (coronary artery disease) 2007   occlusion of mid LCX, 50-70% LAD  . Choledocholithiasis 02/14/2012  . Dyslipidemia   . HTN (hypertension)   . Macular degeneration   . Myocardial infarction ([place]) 1/07   Acute non-ST-elevation myocardial infarction  . Overweight(278.02)    with body mass index of 29    Patient Active Problem List   Diagnosis Date Noted  . Macular degeneration   . Anginal pain ([place])   . Altered mental status 02/16/2012  . Choledocholithiasis 02/14/2012  . Acute cholecystitis 02/14/2012  . Dehydration 12/02/2011  . Delirium 12/02/2011  . Fever 12/02/2011    Class: Acute  . Weakness generalized 12/02/2011    Class: Acute  . Myocardial infarction ([place])   . Dyslipidemia   . HTN (hypertension)   . CAD (coronary artery disease)     Past Surgical History:  Procedure Laterality Date  . CARDIAC CATHETERIZATION  03/17/2005   Ejection fraction is estimated at 55%  . CATARACT EXTRACTION W/ INTRAOCULAR LENS  IMPLANT, BILATERAL  ~ 2010  .  CHOLECYSTECTOMY  02/15/2012   Procedure: LAPAROSCOPIC CHOLECYSTECTOMY WITH INTRAOPERATIVE CHOLANGIOGRAM;  Surgeon: Stark Klein, MD;  Location: [facility];  Service: General;  Laterality: N/A;  . KNEE SURGERY  1980's   "took knee out and put it back in" ; left         Home Medications    Prior to Admission medications   Medication Sig Start Date End Date Taking? Authorizing Provider  clopidogrel (PLAVIX) 75 MG tablet Take 1 tablet by mouth daily. 11/23/14   [provider]  metoprolol succinate (TOPROL-XL) 50 MG 24 hr tablet TAKE 1 TABLET EVERY DAY 02/11/18   Martinique, Peter M, MD  Multiple Vitamin (MULTIVITAMIN) tablet Take 1 tablet by mouth daily.    [provider]  Naproxen Sodium (ALEVE PO) Take by mouth daily.    [provider]  ramipril (ALTACE) 10 MG capsule Take 1 capsule (10 mg total) by mouth daily. Please keep upcoming appt in January for future refills. Thank you 02/28/18   Martinique, Peter M, MD  simvastatin (ZOCOR) 80 MG tablet TAKE 1 TABLET EVERY DAY  AT  [facility] 02/11/18   Martinique, Peter M, MD    Family History Family History  Problem Relation Age of Onset  . Heart disease Father   . Cancer Mother        breast  . Cancer Maternal Aunt        breast    Social History Social History  Tobacco Use  . Smoking status: Never Smoker  . Smokeless tobacco: Never Used  Substance Use Topics  . Alcohol use: No    Comment: 12/05/2011 "stopped all alcohol > 25 years ago; never drank heavily"  . Drug use: No     Allergies   Unasyn [ampicillin-sulbactam sodium]   Review of Systems Review of Systems  Constitutional: Negative for chills and fever.  HENT: Negative for ear pain, nosebleeds and sore throat.   Eyes: Negative for pain and visual disturbance.  Respiratory: Negative for cough and shortness of breath.   Cardiovascular: Negative for chest pain and palpitations.  Gastrointestinal: Positive for blood in stool and hematochezia. Negative for abdominal  distention, abdominal pain, constipation, diarrhea, hematemesis, nausea and vomiting.  Genitourinary: Negative for decreased urine volume, difficulty urinating, dysuria, enuresis, genital sores, hematuria, testicular pain and urgency.  Musculoskeletal: Negative for arthralgias and back pain.  Skin: Negative for color change and rash.  Neurological: Negative for dizziness, seizures, loss of consciousness, syncope and light-headedness.  All other systems reviewed and are negative.    Physical Exam Updated Vital Signs  ED Triage Vitals [04/22/18 1400]  Enc Vitals Group     BP 109/88     Pulse Rate (!) 51     Resp 20     Temp 98 F (36.7 C)     Temp Source Oral     SpO2 100 %     Weight      Height      Head Circumference      Peak Flow      Pain Score 0     Pain Loc      Pain Edu?      Excl. in [place]?     Physical Exam Vitals signs and nursing note reviewed.  Constitutional:      Appearance: He is well-developed.  HENT:     Head: Normocephalic and atraumatic.     Nose: Nose normal.     Mouth/Throat:     Mouth: Mucous membranes are moist.  Eyes:     Extraocular Movements: Extraocular movements intact.     Conjunctiva/sclera: Conjunctivae normal.     Pupils: Pupils are equal, round, and reactive to light.  Neck:     Musculoskeletal: Neck supple.  Cardiovascular:     Rate and Rhythm: Normal rate and regular rhythm.     Heart sounds: Normal heart sounds. No murmur.  Pulmonary:     Effort: Pulmonary effort is normal. No respiratory distress.     Breath sounds: Normal breath sounds.  Abdominal:     General: There is no distension.     Palpations: Abdomen is soft.     Tenderness: There is no abdominal tenderness. There is no guarding.  Genitourinary:    Rectum: Guaiac result positive (no gross melena or hematochezia on rectal exam).  Skin:    General: Skin is warm and dry.  Neurological:     Mental Status: He is alert.  Psychiatric:        Mood and Affect: Mood  normal.      ED Treatments / Results  Labs (all labs ordered are listed, but only abnormal results are displayed) Labs Reviewed  COMPREHENSIVE METABOLIC PANEL - Abnormal; Notable for the following components:      Result Value   CO2 21 (*)    Glucose, Bld 112 (*)    BUN 24 (*)    Creatinine, Ser 1.68 (*)    Calcium 8.8 (*)  Total Bilirubin 1.7 (*)    GFR calc non Af Amer 38 (*)    GFR calc Af Amer 44 (*)    Anion gap 16 (*)    All other components within normal limits  CBC - Abnormal; Notable for the following components:   RBC 5.86 (*)    Hemoglobin 17.8 (*)    HCT 53.8 (*)    All other components within normal limits  POC OCCULT BLOOD, ED - Abnormal; Notable for the following components:   Fecal Occult Bld POSITIVE (*)    All other components within normal limits  TYPE AND SCREEN    EKG None  Radiology No results found.  Procedures Procedures (including critical care time)  Medications Ordered in ED Medications - No data to display   Initial Impression / Assessment and Plan / ED Course  I have reviewed the triage vital signs and the nursing notes.  Pertinent labs & imaging results that were available during my care of the patient were reviewed by me and considered in my medical decision making (see chart for details).     [name] is a 78 year old male with history of high cholesterol, hypertension, CAD on Plavix who presents to the ED with blood in his stool.  Patient with normal vitals.  No fever.  Patient states that he noticed some red blood intermixed with his stool this morning.  Has not had any pain or constipation.  Denies any chest pain, shortness of breath, abdominal pain.  Denies any dark stools or tarry stools.  Does not use any NSAIDs or alcohol.  No history of ulcers.  States that he history of hemorrhoids and polyps.  Patient with no gross melena or hematochezia on exam.  Patient did have positive occult however.  However hemoglobin is  17.  Patient states that he is just over, and a basic cold and has had poor oral intake during this time.  His appetite has increased however today.  He was found to have a mild AKI. Patient likely hemoconcentrated, hemoglobin baseline from years ago is 13. He was offered IV fluids to start hydration but he states that he just wants to do that at home.  He does not have any nausea or vomiting.  Overall patient is asymptomatic.  Vitals are stable.  Hemoglobin is reassuring.  Possibly hemorrhoidal bleeding versus possible polyp. Mostly patient needs to hydrate.  Offered him possible admission as well but at this time he would like to try oral hydration at home.  Recommend strongly that he follow-up with his primary care doctor this week for repeat labs including creatinine and hemoglobin.  Given information to follow-up with GI.  Given strict return precautions if any worsening signs of bleeding occur.  Patient is happy with this plan and was discharged from the ED in good condition.  This chart was dictated using voice recognition software.  Despite best efforts to proofread,  errors can occur which can change the documentation meaning.    Final Clinical Impressions(s) / ED Diagnoses   Final diagnoses:  Rectal bleeding  AKI (acute kidney injury) [facility])    ED Discharge Orders    None       Lennice Sites, DO 04/22/18 1804

## 2018-04-23 LAB — BPAM RBC
Blood Product Expiration Date: 202002252359
Blood Product Expiration Date: 202003022359
Unit Type and Rh: 5100
Unit Type and Rh: 5100

## 2018-04-23 LAB — TYPE AND SCREEN
ABO/RH(D): A POS
ANTIBODY SCREEN: POSITIVE
Donor AG Type: NEGATIVE
Donor AG Type: NEGATIVE
PT AG Type: NEGATIVE
Unit division: 0
Unit division: 0

## 2018-04-26 DIAGNOSIS — K922 Gastrointestinal hemorrhage, unspecified: Secondary | ICD-10-CM | POA: Diagnosis not present

## 2018-04-26 DIAGNOSIS — R7302 Impaired glucose tolerance (oral): Secondary | ICD-10-CM | POA: Diagnosis not present

## 2018-04-26 DIAGNOSIS — Z6829 Body mass index (BMI) 29.0-29.9, adult: Secondary | ICD-10-CM | POA: Diagnosis not present

## 2018-04-29 ENCOUNTER — Encounter: Payer: Self-pay | Admitting: Internal Medicine

## 2018-05-08 ENCOUNTER — Other Ambulatory Visit: Payer: Self-pay | Admitting: Cardiology

## 2018-05-09 DIAGNOSIS — Z961 Presence of intraocular lens: Secondary | ICD-10-CM | POA: Diagnosis not present

## 2018-05-09 DIAGNOSIS — H0101A Ulcerative blepharitis right eye, upper and lower eyelids: Secondary | ICD-10-CM | POA: Diagnosis not present

## 2018-05-09 DIAGNOSIS — H1851 Endothelial corneal dystrophy: Secondary | ICD-10-CM | POA: Diagnosis not present

## 2018-05-09 DIAGNOSIS — H0101B Ulcerative blepharitis left eye, upper and lower eyelids: Secondary | ICD-10-CM | POA: Diagnosis not present

## 2018-05-09 DIAGNOSIS — H353232 Exudative age-related macular degeneration, bilateral, with inactive choroidal neovascularization: Secondary | ICD-10-CM | POA: Diagnosis not present

## 2018-05-09 DIAGNOSIS — H43813 Vitreous degeneration, bilateral: Secondary | ICD-10-CM | POA: Diagnosis not present

## 2018-05-28 ENCOUNTER — Telehealth: Payer: Self-pay

## 2018-05-28 NOTE — Telephone Encounter (Signed)

## 2018-05-29 ENCOUNTER — Ambulatory Visit (INDEPENDENT_AMBULATORY_CARE_PROVIDER_SITE_OTHER): Payer: Medicare Other | Admitting: Internal Medicine

## 2018-05-29 ENCOUNTER — Other Ambulatory Visit: Payer: Self-pay

## 2018-05-29 ENCOUNTER — Encounter: Payer: Self-pay | Admitting: Internal Medicine

## 2018-05-29 VITALS — BP 123/77 | HR 68 | Temp 97.9°F | Ht 74.0 in | Wt 214.4 lb

## 2018-05-29 DIAGNOSIS — I2510 Atherosclerotic heart disease of native coronary artery without angina pectoris: Secondary | ICD-10-CM

## 2018-05-29 DIAGNOSIS — K5730 Diverticulosis of large intestine without perforation or abscess without bleeding: Secondary | ICD-10-CM

## 2018-05-29 DIAGNOSIS — K625 Hemorrhage of anus and rectum: Secondary | ICD-10-CM | POA: Diagnosis not present

## 2018-05-29 MED ORDER — NA SULFATE-K SULFATE-MG SULF 17.5-3.13-1.6 GM/177ML PO SOLN: 1.0000 | Freq: Once | ORAL | 0 refills | Status: AC

## 2018-05-29 NOTE — Patient Instructions (Signed)
You have been scheduled for a colonoscopy. Please follow written instructions given to you at your visit today.  Please pick up your prep supplies at the pharmacy within the next 1-3 days. If you use inhalers (even only as needed), please bring them with you on the day of your procedure. Your physician has requested that you go to www.[email or web address].com and enter the access code given to you at your visit today. This web site gives a general overview about your procedure. However, you should still follow specific instructions given to you by our office regarding your preparation for the procedure.   To help prevent the possible spread of infection to our patients, communities, and staff; we will be implementing the following measures:  Please only allow one visitor/family member to accompany you to any upcoming appointments with [facility]. If you have any concerns about this please contact our office to discuss prior to the appointment.

## 2018-05-29 NOTE — Progress Notes (Signed)
HISTORY OF PRESENT ILLNESS:  [name] is a 78 y.o. male, retired Development worker, international aid, who is sent today by his primary care provider Dr. Osborne Skinner regarding rectal bleeding.  I have not seen the patient since August 2008 when he underwent routine screening colonoscopy.  He was found to have a diminutive colon polyp and severe pandiverticulosis.  Follow-up in 5 years recommended.  He has not been seen since.  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  This occurred on one occasion while defecating.  There was no associated rectal pain or abdominal pain.  He was evaluated in the emergency room.  Patient is on chronic Plavix therapy for prior history of TIA/CVA.  Also has a history of prior MI.  He was evaluated in the emergency room April 22, 2018 (encounter reviewed).  Laboratories reviewed.  Hemoglobin at that time was 17.8.  Stool was positive for occult blood.  Rectal examination did not reveal an obvious source or gross blood though the stool was guaiac positive.  He was strongly urged to see his primary provider and/or gastroenterologist.  On evaluation a few days later with Dr. Osborne Skinner April 26, 2018 his hemoglobin was 16.5.  He had no other complaints or issues.  GI referral made.  Review of outside x-ray file was remarkable for negative MRCP postcholecystectomy 2013  REVIEW OF SYSTEMS:  All non-GI ROS negative unless otherwise stated in the HPI except for visual change  Past Medical History:  Diagnosis Date  . Acute cholecystitis 02/14/2012  . Anginal pain ([place])   . CAD (coronary artery disease) 2007   occlusion of mid LCX, 50-70% LAD  . Choledocholithiasis 02/14/2012  . Dyslipidemia   . HTN (hypertension)   . Macular degeneration   . Myocardial infarction ([place]) 1/07   Acute non-ST-elevation myocardial infarction  . Overweight(278.02)    with body mass index of 29     Past Surgical History:  Procedure Laterality Date  . CARDIAC CATHETERIZATION  03/17/2005   Ejection fraction is estimated at 55%  . CATARACT EXTRACTION W/ INTRAOCULAR LENS  IMPLANT, BILATERAL  ~ 2010  . CHOLECYSTECTOMY  02/15/2012   Procedure: LAPAROSCOPIC CHOLECYSTECTOMY WITH INTRAOPERATIVE CHOLANGIOGRAM;  Surgeon: Timothy Klein, MD;  Location: [facility];  Service: General;  Laterality: N/A;  . KNEE SURGERY  1980's   "took knee out and put it back in" ; left     Social History [name]  reports that he has never smoked. He has never used smokeless tobacco. He reports that he does not drink alcohol or use drugs.  family history includes Cancer in his maternal aunt and mother; Heart disease in his father.  Allergies  Allergen Reactions  . Unasyn [Ampicillin-Sulbactam Sodium] Rash       PHYSICAL EXAMINATION: Vital signs: BP 123/77   Pulse 68   Temp 97.9 F (36.6 C)   Ht 6\' 2"  (1.88 m)   Wt 214 lb 6.4 oz (97.3 kg)   SpO2 97%   BMI 27.53 kg/m   Constitutional: generally well-appearing, no acute distress Psychiatric: alert and oriented x3, cooperative Eyes: extraocular movements intact, anicteric, conjunctiva pink Mouth: oral pharynx moist, no lesions Neck: supple no lymphadenopathy Cardiovascular: heart regular rate and rhythm, no murmur Lungs: clear to auscultation bilaterally Abdomen: soft, nontender, nondistended, no obvious ascites, no peritoneal signs, normal bowel sounds, no organomegaly Rectal: Deferred until colonoscopy Extremities: no clubbing, cyanosis,  or lower extremity edema bilaterally Skin: no lesions on visible extremities Neuro: No focal deficits.  Cranial nerves intact  ASSESSMENT:  1.  Isolated episode of rectal bleeding of unclear etiology.  Question internal hemorrhoid.  Question neoplasia.  Diverticular unlikely 2.  Hemoccult positive stool 3.  Colonoscopy 2008 with pandiverticulosis and diminutive polyp 4.  History of MI and TIA/CVA on  Plavix 5.  Stable hemoglobin   PLAN:  1.  Schedule colonoscopy to evaluate rectal bleeding.  The patient is HIGH RISK given his age, comorbidities, and the need for chronic Plavix therapy.  We will perform the examination ON PLAVIX with the associated risks and limitations carefully reviewed.  He agrees.The nature of the procedure, as well as the risks, benefits, and alternatives were carefully and thoroughly reviewed with the patient. Ample time for discussion and questions allowed. The patient understood, was satisfied, and agreed to proceed.  A copy of this consultation note has been forwarded to Dr. Osborne Skinner

## 2018-06-18 ENCOUNTER — Telehealth: Payer: Self-pay | Admitting: *Deleted

## 2018-06-19 ENCOUNTER — Encounter: Payer: Medicare Other | Admitting: Internal Medicine

## 2018-06-19 NOTE — Telephone Encounter (Signed)
Covid-19 travel screening questions  Have you traveled in the last 14 days? If yes where?  Do you now or have you had a fever in the last 14 days?no  Do you have any respiratory symptoms of shortness of breath or cough now or in the last 14 days? no   Do you have any family members or close contacts with diagnosed or suspected Covid-19?no

## 2018-06-20 ENCOUNTER — Encounter (HOSPITAL_COMMUNITY): Payer: Self-pay | Admitting: Emergency Medicine

## 2018-06-20 ENCOUNTER — Telehealth: Payer: Self-pay | Admitting: Internal Medicine

## 2018-06-20 ENCOUNTER — Other Ambulatory Visit: Payer: Self-pay

## 2018-06-20 ENCOUNTER — Ambulatory Visit (AMBULATORY_SURGERY_CENTER): Payer: Medicare Other | Admitting: Internal Medicine

## 2018-06-20 ENCOUNTER — Observation Stay (HOSPITAL_COMMUNITY)
Admission: EM | Admit: 2018-06-20 | Discharge: 2018-06-21 | Disposition: A | Discharge status: Home / Self Care | Payer: Medicare Other | Attending: Internal Medicine | Admitting: Internal Medicine

## 2018-06-20 ENCOUNTER — Observation Stay (HOSPITAL_COMMUNITY): Payer: Medicare Other

## 2018-06-20 ENCOUNTER — Encounter: Payer: Self-pay | Admitting: Internal Medicine

## 2018-06-20 VITALS — BP 137/80 | HR 68 | Temp 98.4°F | Resp 18 | Ht 74.0 in | Wt 214.0 lb

## 2018-06-20 DIAGNOSIS — K625 Hemorrhage of anus and rectum: Secondary | ICD-10-CM

## 2018-06-20 DIAGNOSIS — E663 Overweight: Secondary | ICD-10-CM | POA: Insufficient documentation

## 2018-06-20 DIAGNOSIS — E785 Hyperlipidemia, unspecified: Secondary | ICD-10-CM | POA: Diagnosis not present

## 2018-06-20 DIAGNOSIS — R933 Abnormal findings on diagnostic imaging of other parts of digestive tract: Secondary | ICD-10-CM | POA: Diagnosis not present

## 2018-06-20 DIAGNOSIS — K922 Gastrointestinal hemorrhage, unspecified: Secondary | ICD-10-CM | POA: Diagnosis not present

## 2018-06-20 DIAGNOSIS — H3530 Unspecified macular degeneration: Secondary | ICD-10-CM | POA: Diagnosis not present

## 2018-06-20 DIAGNOSIS — K6389 Other specified diseases of intestine: Secondary | ICD-10-CM | POA: Diagnosis not present

## 2018-06-20 DIAGNOSIS — I10 Essential (primary) hypertension: Secondary | ICD-10-CM | POA: Diagnosis not present

## 2018-06-20 DIAGNOSIS — Z8249 Family history of ischemic heart disease and other diseases of the circulatory system: Secondary | ICD-10-CM | POA: Insufficient documentation

## 2018-06-20 DIAGNOSIS — Z79899 Other long term (current) drug therapy: Secondary | ICD-10-CM | POA: Insufficient documentation

## 2018-06-20 DIAGNOSIS — Z6829 Body mass index (BMI) 29.0-29.9, adult: Secondary | ICD-10-CM | POA: Diagnosis not present

## 2018-06-20 DIAGNOSIS — I252 Old myocardial infarction: Secondary | ICD-10-CM | POA: Diagnosis not present

## 2018-06-20 DIAGNOSIS — Z8673 Personal history of transient ischemic attack (TIA), and cerebral infarction without residual deficits: Secondary | ICD-10-CM | POA: Diagnosis not present

## 2018-06-20 DIAGNOSIS — K5730 Diverticulosis of large intestine without perforation or abscess without bleeding: Secondary | ICD-10-CM | POA: Insufficient documentation

## 2018-06-20 DIAGNOSIS — D120 Benign neoplasm of cecum: Secondary | ICD-10-CM | POA: Diagnosis not present

## 2018-06-20 DIAGNOSIS — Z7902 Long term (current) use of antithrombotics/antiplatelets: Secondary | ICD-10-CM | POA: Diagnosis not present

## 2018-06-20 DIAGNOSIS — I2510 Atherosclerotic heart disease of native coronary artery without angina pectoris: Secondary | ICD-10-CM | POA: Diagnosis not present

## 2018-06-20 DIAGNOSIS — I25119 Atherosclerotic heart disease of native coronary artery with unspecified angina pectoris: Secondary | ICD-10-CM | POA: Diagnosis not present

## 2018-06-20 DIAGNOSIS — D124 Benign neoplasm of descending colon: Secondary | ICD-10-CM | POA: Diagnosis not present

## 2018-06-20 DIAGNOSIS — K5900 Constipation, unspecified: Secondary | ICD-10-CM | POA: Diagnosis not present

## 2018-06-20 LAB — CBC WITH DIFFERENTIAL/PLATELET
Abs Immature Granulocytes: 0.04 10*3/uL (ref 0.00–0.07)
Basophils Absolute: 0.1 10*3/uL (ref 0.0–0.1)
Basophils Relative: 0 %
Eosinophils Absolute: 0.3 10*3/uL (ref 0.0–0.5)
Eosinophils Relative: 3 %
HCT: 44.2 % (ref 39.0–52.0)
Hemoglobin: 14.6 g/dL (ref 13.0–17.0)
Immature Granulocytes: 0 %
Lymphocytes Relative: 13 %
Lymphs Abs: 1.5 10*3/uL (ref 0.7–4.0)
MCH: 31.9 pg (ref 26.0–34.0)
MCHC: 33 g/dL (ref 30.0–36.0)
MCV: 96.7 fL (ref 80.0–100.0)
Monocytes Absolute: 0.5 10*3/uL (ref 0.1–1.0)
Monocytes Relative: 4 %
Neutro Abs: 9.1 10*3/uL — ABNORMAL HIGH (ref 1.7–7.7)
Neutrophils Relative %: 80 %
Platelets: 212 10*3/uL (ref 150–400)
RBC: 4.57 MIL/uL (ref 4.22–5.81)
RDW: 13.6 % (ref 11.5–15.5)
WBC: 11.5 10*3/uL — ABNORMAL HIGH (ref 4.0–10.5)
nRBC: 0 % (ref 0.0–0.2)

## 2018-06-20 LAB — COMPREHENSIVE METABOLIC PANEL
ALT: 17 U/L (ref 0–44)
AST: 19 U/L (ref 15–41)
Albumin: 4.4 g/dL (ref 3.5–5.0)
Alkaline Phosphatase: 79 U/L (ref 38–126)
Anion gap: 11 (ref 5–15)
BUN: 13 mg/dL (ref 8–23)
CO2: 19 mmol/L — ABNORMAL LOW (ref 22–32)
Calcium: 8.6 mg/dL — ABNORMAL LOW (ref 8.9–10.3)
Chloride: 111 mmol/L (ref 98–111)
Creatinine, Ser: 1.04 mg/dL (ref 0.61–1.24)
GFR calc Af Amer: >60 mL/min (ref >60)
GFR calc non Af Amer: >60 mL/min (ref >60)
Glucose, Bld: 97 mg/dL (ref 70–99)
Potassium: 3.5 mmol/L (ref 3.5–5.1)
Sodium: 141 mmol/L (ref 135–145)
Total Bilirubin: 3.4 mg/dL — ABNORMAL HIGH (ref 0.3–1.2)
Total Protein: 6.9 g/dL (ref 6.5–8.1)

## 2018-06-20 LAB — TYPE AND SCREEN
ABO/RH(D): A POS
Antibody Screen: POSITIVE

## 2018-06-20 LAB — URINALYSIS, ROUTINE W REFLEX MICROSCOPIC
Bacteria, UA: NONE SEEN
Bilirubin Urine: NEGATIVE
Glucose, UA: NEGATIVE mg/dL
Ketones, ur: 80 mg/dL — AB
Leukocytes,Ua: NEGATIVE
Nitrite: NEGATIVE
Protein, ur: NEGATIVE mg/dL
Specific Gravity, Urine: 1.019 (ref 1.005–1.030)
pH: 5 (ref 5.0–8.0)

## 2018-06-20 LAB — POC OCCULT BLOOD, ED: Fecal Occult Bld: POSITIVE — AB

## 2018-06-20 LAB — LIPASE, BLOOD: Lipase: 24 U/L (ref 11–51)

## 2018-06-20 LAB — HEMOGLOBIN AND HEMATOCRIT, BLOOD
HCT: 39 % (ref 39.0–52.0)
Hemoglobin: 12.7 g/dL — ABNORMAL LOW (ref 13.0–17.0)

## 2018-06-20 MED ORDER — SODIUM CHLORIDE 0.9 % IV BOLUS
1000.0000 mL | Freq: Once | INTRAVENOUS | Status: AC
  Administered 2018-06-20: 1000 mL via INTRAVENOUS

## 2018-06-20 MED ORDER — PEG-KCL-NACL-NASULF-NA ASC-C 100 G PO SOLR: 1.0000 | Freq: Once | ORAL | Status: DC

## 2018-06-20 MED ORDER — METOPROLOL SUCCINATE ER 50 MG PO TB24
50.0000 mg | ORAL_TABLET | Freq: Every day | ORAL | Status: DC
  Administered 2018-06-21: 50 mg via ORAL
  Filled 2018-06-20: qty 1

## 2018-06-20 MED ORDER — PEG-KCL-NACL-NASULF-NA ASC-C 100 G PO SOLR
0.5000 | Freq: Once | ORAL | Status: AC
  Administered 2018-06-20: 100 g via ORAL
  Filled 2018-06-20 (×3): qty 1

## 2018-06-20 MED ORDER — PEG-KCL-NACL-NASULF-NA ASC-C 100 G PO SOLR
0.5000 | Freq: Once | ORAL | Status: AC
  Administered 2018-06-21: 100 g via ORAL

## 2018-06-20 MED ORDER — PANTOPRAZOLE SODIUM 40 MG IV SOLR
40.0000 mg | INTRAVENOUS | Status: DC
  Administered 2018-06-20: 40 mg via INTRAVENOUS
  Filled 2018-06-20: qty 40

## 2018-06-20 MED ORDER — RAMIPRIL 10 MG PO CAPS
10.0000 mg | ORAL_CAPSULE | Freq: Every day | ORAL | Status: DC
  Administered 2018-06-21: 10 mg via ORAL
  Filled 2018-06-20: qty 1

## 2018-06-20 MED ORDER — SODIUM CHLORIDE 0.9 % IV SOLN
INTRAVENOUS | Status: DC
  Administered 2018-06-20: 17:00:00 via INTRAVENOUS

## 2018-06-20 MED ORDER — ONDANSETRON HCL 4 MG/2ML IJ SOLN
4.0000 mg | Freq: Four times a day (QID) | INTRAMUSCULAR | Status: DC | PRN
  Administered 2018-06-20: 4 mg via INTRAVENOUS
  Filled 2018-06-20: qty 2

## 2018-06-20 MED ORDER — SODIUM CHLORIDE 0.9 % IV SOLN: 500.0000 mL | Freq: Once | INTRAVENOUS | Status: DC

## 2018-06-20 NOTE — ED Provider Notes (Signed)
[facility] DEPT Provider Note   CSN: [id number] Arrival date & time: 06/20/18  1311    History   Chief Complaint Chief Complaint  Patient presents with  . Rectal Bleeding  . Post-op Problem    HPI [name] is a 78 y.o. male.     The history is provided by the patient and medical records. No language interpreter was used.  Rectal Bleeding     78 year old male with history of CVA on chronic Plavix, CAD, prior MI, recent colonoscopy performed by Dr. Henrene Pastor today presenting for evaluation of rectal bleeding.  Patient had a biopsy of a left colonic mass.  He is still on Plavix. Upon Discharge patient report using the bathroom twice today since the procedure and have been seeing blood in toilet.  Report moderate amount of blood in the toilet bowl with some small amount of clots.  Aside from a constant headache that he attributes to not eating for the past few days in preparation for the colonoscopy, he denies any significant chest pain, trouble breathing, lightheadedness, dizziness, abdominal pain or rectal pain.  Past Medical History:  Diagnosis Date  . Acute cholecystitis 02/14/2012  . Anginal pain ([place])   . CAD (coronary artery disease) 2007   occlusion of mid LCX, 50-70% LAD  . Cataract 2010   bilateral eyes  . Choledocholithiasis 02/14/2012  . Dyslipidemia   . HTN (hypertension)   . Macular degeneration   . Myocardial infarction ([place]) 1/07   Acute non-ST-elevation myocardial infarction  . Overweight(278.02)    with body mass index of 29    Patient Active Problem List   Diagnosis Date Noted  . Macular degeneration   . Anginal pain ([place])   . Altered mental status 02/16/2012  . Choledocholithiasis 02/14/2012  . Acute cholecystitis 02/14/2012  . Dehydration 12/02/2011  . Delirium 12/02/2011  . Fever 12/02/2011    Class: Acute  . Weakness generalized 12/02/2011    Class: Acute  . Myocardial infarction ([place])   . Dyslipidemia   .  HTN (hypertension)   . CAD (coronary artery disease)     Past Surgical History:  Procedure Laterality Date  . CARDIAC CATHETERIZATION  03/17/2005   Ejection fraction is estimated at 55%  . CATARACT EXTRACTION W/ INTRAOCULAR LENS  IMPLANT, BILATERAL  ~ 2010  . CHOLECYSTECTOMY  02/15/2012   Procedure: LAPAROSCOPIC CHOLECYSTECTOMY WITH INTRAOPERATIVE CHOLANGIOGRAM;  Surgeon: Stark Klein, MD;  Location: [facility];  Service: General;  Laterality: N/A;  . KNEE SURGERY  1980's   "took knee out and put it back in" ; left         Home Medications    Prior to Admission medications   Medication Sig Start Date End Date Taking? Authorizing Provider  clopidogrel (PLAVIX) 75 MG tablet Take 1 tablet by mouth daily. 11/23/14   [provider]  metoprolol succinate (TOPROL-XL) 50 MG 24 hr tablet TAKE 1 TABLET EVERY DAY 02/11/18   Martinique, Peter M, MD  Multiple Vitamin (MULTIVITAMIN) tablet Take 1 tablet by mouth daily.    [provider]  ramipril (ALTACE) 10 MG capsule Take 1 capsule (10 mg total) by mouth daily. 05/08/18   Martinique, Peter M, MD  simvastatin (ZOCOR) 80 MG tablet TAKE 1 TABLET EVERY DAY  AT  [facility] 02/11/18   Martinique, Peter M, MD    Family History Family History  Problem Relation Age of Onset  . Heart disease Father   . Cancer Mother  breast  . Stomach cancer Mother   . Cancer Maternal Aunt        breast  . Esophageal cancer Neg Hx   . Rectal cancer Neg Hx   . Colon cancer Neg Hx     Social History Social History   Tobacco Use  . Smoking status: Never Smoker  . Smokeless tobacco: Never Used  Substance Use Topics  . Alcohol use: No    Comment: 12/05/2011 "stopped all alcohol > 25 years ago; never drank heavily"  . Drug use: No     Allergies   Unasyn [ampicillin-sulbactam sodium]   Review of Systems Review of Systems  Gastrointestinal: Positive for hematochezia.  All other systems reviewed and are negative.    Physical Exam Updated Vital Signs  BP 122/89 (BP Location: Left Arm)   Pulse (!) 125   Temp 97.8 F (36.6 C) (Oral)   Resp 19   SpO2 97%   Physical Exam Vitals signs and nursing note reviewed.  Constitutional:      General: He is not in acute distress.    Appearance: He is well-developed.  HENT:     Head: Atraumatic.  Eyes:     Conjunctiva/sclera: Conjunctivae normal.  Neck:     Musculoskeletal: Neck supple.  Cardiovascular:     Rate and Rhythm: Normal rate and regular rhythm.  Pulmonary:     Effort: Pulmonary effort is normal.     Breath sounds: Normal breath sounds.  Abdominal:     General: Abdomen is flat.     Palpations: Abdomen is soft.     Tenderness: There is no abdominal tenderness.  Genitourinary:    Comments: Chaperone present during exam.  Normal rectal tone, moderate amount of bright red blood per rectum without any obvious mass. Skin:    Findings: No rash.  Neurological:     Mental Status: He is alert.      ED Treatments / Results  Labs (all labs ordered are listed, but only abnormal results are displayed) Labs Reviewed  CBC WITH DIFFERENTIAL/PLATELET - Abnormal; Notable for the following components:      Result Value   WBC 11.5 (*)    Neutro Abs 9.1 (*)    All other components within normal limits  COMPREHENSIVE METABOLIC PANEL - Abnormal; Notable for the following components:   CO2 19 (*)    Calcium 8.6 (*)    Total Bilirubin 3.4 (*)    All other components within normal limits  POC OCCULT BLOOD, ED - Abnormal; Notable for the following components:   Fecal Occult Bld POSITIVE (*)    All other components within normal limits  LIPASE, BLOOD  URINALYSIS, ROUTINE W REFLEX MICROSCOPIC  TYPE AND SCREEN    EKG None  Radiology No results found.  Procedures Procedures (including critical care time)  Medications Ordered in ED Medications  sodium chloride 0.9 % bolus 1,000 mL (1,000 mLs Intravenous New Bag/Given 06/20/18 1405)     Initial Impression / Assessment and Plan /  ED Course  I have reviewed the triage vital signs and the nursing notes.  Pertinent labs & imaging results that were available during my care of the patient were reviewed by me and considered in my medical decision making (see chart for details).        BP 128/85   Pulse 94   Temp 97.8 F (36.6 C) (Oral)   Resp 18   SpO2 96%    Final Clinical Impressions(s) / ED Diagnoses   Final  diagnoses:  Lower GI bleed    ED Discharge Orders    None     1:45 PM Patient had a colonoscopy done today by Dr. Henrene Pastor.  A colonic mass was discovered and biopsy was obtained.  He is currently on Plavix.  He has been having bright red blood per rectum status post procedure.  He has a soft nontender abdomen therefore I have low suspicion for bowel perforation GI is aware of his condition and have recommended hospital admission observation, hold Plavix.  Work-up initiated.  3:10 PM Pt is hemodynamically stable, Hgb 14.6.  Fecal occult blood test positive.  Electrolytes panel are reassuring.  Appreciate consultation from [facility] team who will see pt.  Will consult medicine for admission.  Care discussed with Dr. Darl Householder.   3:21 PM Appreciate consultation from [facility] Dr. Karleen Hampshire who agrees to see and admit pt for obs.    Domenic Moras, PA-C 06/20/18 1521    Drenda Freeze, MD 06/20/18 [phone]

## 2018-06-20 NOTE — Telephone Encounter (Signed)
Consulted on him earlier today

## 2018-06-20 NOTE — Op Note (Addendum)
[place] Patient Name: Jodey Burbano Procedure Date: 06/20/2018 8:12 AM MRN: [id number] Endoscopist: Docia Chuck. Henrene Pastor , MD Age: 78 Referring MD:  Date of Birth: 10/29/1940 Gender: Male Account #: [id number] Procedure:                Colonoscopy with cold snare polypectomy,w/                            biopsies,w/ submucosal injection Indications:              Heme positive stool, Rectal bleeding Medicines:                Monitored Anesthesia Care Procedure:                Pre-Anesthesia Assessment:                           - Prior to the procedure, a History and Physical                            was performed, and patient medications and                            allergies were reviewed. The patient's tolerance of                            previous anesthesia was also reviewed. The risks                            and benefits of the procedure and the sedation                            options and risks were discussed with the patient.                            All questions were answered, and informed consent                            was obtained. Prior Anticoagulants: The patient has                            taken Plavix (clopidogrel), last dose was day of                            procedure. ASA Grade Assessment: III - A patient                            with severe systemic disease. After reviewing the                            risks and benefits, the patient was deemed in                            satisfactory condition to undergo the procedure.  After obtaining informed consent, the colonoscope                            was passed under direct vision. Throughout the                            procedure, the patient's blood pressure, pulse, and                            oxygen saturations were monitored continuously. The                            Colonoscope was introduced through the anus and   advanced to the the cecum, identified by                            appendiceal orifice and ileocecal valve. The                            ileocecal valve, appendiceal orifice, and rectum                            were photographed. The quality of the bowel                            preparation was good. The colonoscopy was performed                            without difficulty. The patient tolerated the                            procedure well. The bowel preparation used was                            SUPREP via split dose instruction. Scope In: 8:19:01 AM Scope Out: 8:42:56 AM Scope Withdrawal Time: 0 hours 19 minutes 23 seconds  Total Procedure Duration: 0 hours 23 minutes 55 seconds  Findings:                 A non-obstructing circular mass with central                            umbilication was found in the descending colon. The                            mass was non-circumferential. The mass measured two                            cm in length. In addition, its diameter measured                            twenty mm. No bleeding was present. This was  biopsied with a cold forceps for histology. Area                            was tattooed with an injection of Spot (carbon                            black) at the lesion and circumferential to the                            lesion. See images.                           A 5 mm polyp was found in the ileocecal valve. The                            polyp was sessile. The polyp was removed with a                            cold snare. Resection and retrieval were complete.                           Multiple small and large-mouthed diverticula were                            found in the entire colon.                           The exam was otherwise without abnormality on                            direct and retroflexion views. Complications:            No immediate complications. Estimated blood loss:                             None. Estimated Blood Loss:     Estimated blood loss: none. Impression:               - Likely malignant tumor in the descending colon.                            Biopsied. Tattooed.                           - One 5 mm polyp at the ileocecal valve, removed                            with a cold snare. Resected and retrieved.                           - Diverticulosis in the entire examined colon.                           - The examination was otherwise normal on direct  and retroflexion views. Recommendation:           - Repeat colonoscopy in 1 year for surveillance                            most likely.                           - Patient has a contact number available for                            emergencies. The signs and symptoms of potential                            delayed complications were discussed with the                            patient. Return to normal activities tomorrow.                            Written discharge instructions were provided to the                            patient.                           - Resume previous diet.                           - Continue present medications including Plavix.                           - Await pathology results. Thereafter anticipate                            the need for surgical referral and CT imaging.Docia Chuck Henrene Pastor, MD 06/20/2018 9:04:50 AM This report has been signed electronically.

## 2018-06-20 NOTE — H&P (Signed)
History and Physical    DARSH VANDEVOORT QPY:[id number] DOB: [birth year] DOA: 06/20/2018  PCP: Haywood Pao, MD  Patient coming from: Home   I have personally briefly reviewed patient's old medical records in [place]  Chief Complaint: rectal bleeding since colonoscopy.   HPI: [name] is a 78 y.o. male with medical history significant of CAD, Hypertension, dyslipidemia, macular degeneration, MI, presented to ED with large hematochezia, underwent colonoscopy this am, by Dr Henrene Pastor, was discharged home. But patient reports having persistent rectal bleeding since colonoscopy and he came back to ED.  As per the colonoscopy report, he was found to have descending colon mass, non obstructing, non circumferential mass, underwent biopsy and cold snare removal of 5 mm polyp.   He also reports taking plavix this morning. He denies any chest pain, sob, fever, chills, cough, nausea, vomiting or abdominal pain,. His baseline hemoglobin is around 17 and is currently at 14.  He was referred to medical service for admission for rectal bleeding. GI is on board as consultants.   ED Course: on arrival to ED, pt is afebrile, tachycardic, hypotensive, and tachypneic.  Labs reveals mild leukocytosis of 11.5, hemoglobin of 14.6 down from 17. UA Negative. And stool for occult blood is positive.   Review of Systems: As per HPI otherwise 10 point review of systems negative.    Past Medical History:  Diagnosis Date  . Acute cholecystitis 02/14/2012  . Anginal pain ([place])   . CAD (coronary artery disease) 2007   occlusion of mid LCX, 50-70% LAD  . Cataract 2010   bilateral eyes  . Choledocholithiasis 02/14/2012  . Dyslipidemia   . HTN (hypertension)   . Macular degeneration   . Myocardial infarction ([place]) 1/07   Acute non-ST-elevation myocardial infarction  . Overweight(278.02)    with body mass index of 29    Past Surgical History:  Procedure Laterality Date  . CARDIAC CATHETERIZATION   03/17/2005   Ejection fraction is estimated at 55%  . CATARACT EXTRACTION W/ INTRAOCULAR LENS  IMPLANT, BILATERAL  ~ 2010  . CHOLECYSTECTOMY  02/15/2012   Procedure: LAPAROSCOPIC CHOLECYSTECTOMY WITH INTRAOPERATIVE CHOLANGIOGRAM;  Surgeon: Stark Klein, MD;  Location: [facility];  Service: General;  Laterality: N/A;  . KNEE SURGERY  1980's   "took knee out and put it back in" ; left      reports that he has never smoked. He has never used smokeless tobacco. He reports that he does not drink alcohol or use drugs.  Allergies  Allergen Reactions  . Unasyn [Ampicillin-Sulbactam Sodium] Rash    Family History  Problem Relation Age of Onset  . Heart disease Father   . Cancer Mother        breast  . Stomach cancer Mother   . Cancer Maternal Aunt        breast  . Esophageal cancer Neg Hx   . Rectal cancer Neg Hx   . Colon cancer Neg Hx     Family history reviewed and not pertinent.  Prior to Admission medications   Medication Sig Start Date End Date Taking? Authorizing Provider  clopidogrel (PLAVIX) 75 MG tablet Take 1 tablet by mouth daily. 11/23/14  Yes [provider]  metoprolol succinate (TOPROL-XL) 50 MG 24 hr tablet TAKE 1 TABLET EVERY DAY 02/11/18  Yes Martinique, Peter M, MD  Multiple Vitamin (MULTIVITAMIN) tablet Take 1 tablet by mouth daily.   Yes [provider]  Multiple Vitamins-Minerals (MULTIVITAMIN ADULT) TABS Take 1 tablet  by mouth daily.   Yes [provider]  ramipril (ALTACE) 10 MG capsule Take 1 capsule (10 mg total) by mouth daily. 05/08/18  Yes Martinique, Peter M, MD  simvastatin (ZOCOR) 80 MG tablet TAKE 1 TABLET EVERY DAY  AT  6PM Patient taking differently: Take 80 mg by mouth daily.  02/11/18  Yes Martinique, Peter M, MD    Physical Exam: Vitals:   06/20/18 1321 06/20/18 1400 06/20/18 1430 06/20/18 1545  BP: 122/89 127/81 128/85 123/66  Pulse: (!) 125 83 94 78  Resp: 19 18 18 18   Temp: 97.8 F (36.6 C)     TempSrc: Oral     SpO2: 97% 93% 96%  98%    Constitutional: NAD, calm, comfortable Vitals:   06/20/18 1321 06/20/18 1400 06/20/18 1430 06/20/18 1545  BP: 122/89 127/81 128/85 123/66  Pulse: (!) 125 83 94 78  Resp: 19 18 18 18   Temp: 97.8 F (36.6 C)     TempSrc: Oral     SpO2: 97% 93% 96% 98%   Eyes: PERRL, lids and conjunctivae normal ENMT: Mucous membranes are moist.  Neck: normal, supple, no masses, no thyromegaly Respiratory: clear to auscultation bilaterally, no wheezing, no crackles. Normal respiratory effort.   Cardiovascular: Regular rate and rhythm No extremity edema. 2+ pedal pulses. No carotid bruits.  Abdomen: no tenderness, no masses palpated. Bowel sounds positive.  Musculoskeletal: no clubbing / cyanosis. No joint deformity upper and lower extremities. Good ROM, no contractures. Normal muscle tone.  Skin: no rashes, lesions, ulcers. No induration Neurologic: CN 2-12 grossly intact. Sensation intact, DTR normal. Strength 5/5 in all 4.  Psychiatric: Normal judgment and insight. Alert and oriented x 3. Normal mood.     Labs on Admission: I have personally reviewed following labs and imaging studies  CBC: Recent Labs  Lab 06/20/18 1354  WBC 11.5*  NEUTROABS 9.1*  HGB 14.6  HCT 44.2  MCV 96.7  PLT 696   Basic Metabolic Panel: Recent Labs  Lab 06/20/18 1354  NA 141  K 3.5  CL 111  CO2 19*  GLUCOSE 97  BUN 13  CREATININE 1.04  CALCIUM 8.6*   GFR: Estimated Creatinine Clearance: 67 mL/min (by C-G formula based on SCr of 1.04 mg/dL). Liver Function Tests: Recent Labs  Lab 06/20/18 1354  AST 19  ALT 17  ALKPHOS 79  BILITOT 3.4*  PROT 6.9  ALBUMIN 4.4   Recent Labs  Lab 06/20/18 1354  LIPASE 24   No results for input(s): AMMONIA in the last 168 hours. Coagulation Profile: No results for input(s): INR, PROTIME in the last 168 hours. Cardiac Enzymes: No results for input(s): CKTOTAL, CKMB, CKMBINDEX, TROPONINI in the last 168 hours. BNP (last 3 results) No results for  input(s): PROBNP in the last 8760 hours. HbA1C: No results for input(s): HGBA1C in the last 72 hours. CBG: No results for input(s): GLUCAP in the last 168 hours. Lipid Profile: No results for input(s): CHOL, HDL, LDLCALC, TRIG, CHOLHDL, LDLDIRECT in the last 72 hours. Thyroid Function Tests: No results for input(s): TSH, T4TOTAL, FREET4, T3FREE, THYROIDAB in the last 72 hours. Anemia Panel: No results for input(s): VITAMINB12, FOLATE, FERRITIN, TIBC, IRON, RETICCTPCT in the last 72 hours. Urine analysis:    Component Value Date/Time   COLORURINE YELLOW 06/20/2018 [place] 06/20/2018 1354   LABSPEC 1.019 06/20/2018 1354   PHURINE 5.0 06/20/2018 1354   GLUCOSEU NEGATIVE 06/20/2018 1354   HGBUR SMALL (A) 06/20/2018 1354   BILIRUBINUR  NEGATIVE 06/20/2018 1354   KETONESUR 80 (A) 06/20/2018 1354   PROTEINUR NEGATIVE 06/20/2018 1354   UROBILINOGEN >8.0 (H) 02/18/2012 1006   NITRITE NEGATIVE 06/20/2018 1354   LEUKOCYTESUR NEGATIVE 06/20/2018 1354    Radiological Exams on Admission: No results found.  EKG: not done.   Assessment/Plan Active Problems:   Rectal bleed   Persistent rectal bleeding  Differential include bleeding from the biopsy site vs from colon mass.  Holding plavix, gentle hydration.  H&H every 12 hours and transfuse to keep hemoglobin greater than 9, as he has h/o of CAD.  Appreciate GI assistance.  Follow the biopsy report.    Descending colon mass:  S/p biopsy, suspicious for malignancy.  Follow the report.   Mild leukocytosis  Possibly reactive.    Hyperlipidemia:  Holding home meds at this time.    Hypertension:  Well controlled.   H/o CAD Pt denies any chest pain or sob or pedal edema.  Holding plavix at this time.     DVT prophylaxis: scd's.  Code Status: full code.  Family Communication: none at bedside, discussed the plan with the patient.  Disposition Plan: pending resolution of the rectal bleed.  Consults called:  GI Dr Henrene Pastor.  Admission status: obs/ med surg.   Hosie Poisson MD [facility] Pager [phone]   If 7PM-7AM, please contact night-coverage [email or web address] Password [email or web address]  06/20/2018, 4:19 PM

## 2018-06-20 NOTE — Telephone Encounter (Signed)
Pt states that when he arrived home, he had to go to the bathroom right away- he wasn't able to hold it and states he passed a large amount of blood.  It was enough to saturate his underwear and his pants as well.  Then he made it to the bathroom and stated he passed a large amount in the toilet too. No clots noted.  He denies pain, dizziness, or any discomfort of any time.  Dr. Henrene Pastor, please advise.  [name]

## 2018-06-20 NOTE — Consult Note (Signed)
Referring Provider: [facility]   Primary Care Physician:  [name], [name], MD Primary Gastroenterologist:  Scarlette Shorts, MD     Reason for Consultation:   Rectal bleeding    ASSESSMENT / PLAN:      78 year old male in the ED with dynamically stable lower GI bleed on Plavix. Several episodes of large-volume hematochezia following colonoscopy this a.m. with biopsies of descending colon mass and cold snare removal of a 5 mm polyp at the cecal valve . -Will give bowel prep just in case we decide to proceed with repeat colonoscopy. If bleeding subsides with the bowel prep he may not need repeat colonoscopy.  Patient is agreeable with the plan.  -Hold plavix. It took a dose this am.   HPI:      HPI: [name] is a 78 y.o. male with diverticulosis, HTN, hx of TIA / CVA on plavix, CAD / MI. He was evaluated in our office a couple weeks ago for rectal bleeding.  He underwent colonoscopy this a.m (on plavix). with removal of a 5 mm polyp biopsies of a descending colon mass  Colonoscopy 06/20/18 -  Non-obstructing, non-circumferential mass in the descending colon measuring about 2 cm.  The mass was biopsied, area tattooed.  There was also a 5 mm sessile polyp in the ICV removed via cold snare.  Multiple small and large mouth diverticula were found in the entire colon  Patient presented to the ED today with painless rectal bleeding.  He had 2 episodes of large-volume hematochezia at home prior to coming to the emergency department.  Blood described as bright red.  He has had 2 episodes of large-volume hematochezia here in the ED.Marland Kitchen No associated abdominal pain.  No nausea or vomiting.  No weakness/dizziness/shortness of breath chest pain.  His hemoglobin is in the 14 range, down about 3 g from baseline  ED EVALUATION:  Initially tachycardic at 125, now improved WBC 11.5, hemoglobin 14.4, baseline 17.8, hematocrit 44, platelets 212 Total bilirubin 3.4 (chronically elevated ) CMET  otherwise unremarkable. FOBT positive   Past Medical History:  Diagnosis Date  . Acute cholecystitis 02/14/2012  . Anginal pain ([place])   . CAD (coronary artery disease) 2007   occlusion of mid LCX, 50-70% LAD  . Cataract 2010   bilateral eyes  . Choledocholithiasis 02/14/2012  . Dyslipidemia   . HTN (hypertension)   . Macular degeneration   . Myocardial infarction ([place]) 1/07   Acute non-ST-elevation myocardial infarction  . Overweight(278.02)    with body mass index of 29    Past Surgical History:  Procedure Laterality Date  . CARDIAC CATHETERIZATION  03/17/2005   Ejection fraction is estimated at 55%  . CATARACT EXTRACTION W/ INTRAOCULAR LENS  IMPLANT, BILATERAL  ~ 2010  . CHOLECYSTECTOMY  02/15/2012   Procedure: LAPAROSCOPIC CHOLECYSTECTOMY WITH INTRAOPERATIVE CHOLANGIOGRAM;  Surgeon: Stark Klein, MD;  Location: [facility];  Service: General;  Laterality: N/A;  . KNEE SURGERY  1980's   "took knee out and put it back in" ; left     Prior to Admission medications   Medication Sig Start Date End Date Taking? Authorizing Provider  clopidogrel (PLAVIX) 75 MG tablet Take 1 tablet by mouth daily. 11/23/14  Yes [provider]  metoprolol succinate (TOPROL-XL) 50 MG 24 hr tablet TAKE 1 TABLET EVERY DAY 02/11/18  Yes Martinique, Peter M, MD  Multiple Vitamin (MULTIVITAMIN) tablet Take 1 tablet by mouth daily.   Yes [provider]  Multiple Vitamins-Minerals (MULTIVITAMIN ADULT)  TABS Take 1 tablet by mouth daily.   Yes [provider]  ramipril (ALTACE) 10 MG capsule Take 1 capsule (10 mg total) by mouth daily. 05/08/18  Yes Martinique, Peter M, MD  simvastatin (ZOCOR) 80 MG tablet TAKE 1 TABLET EVERY DAY  AT  6PM Patient taking differently: Take 80 mg by mouth daily.  02/11/18  Yes Martinique, Peter M, MD    No current facility-administered medications for this encounter.    Current Outpatient Medications  Medication Sig Dispense Refill  . clopidogrel (PLAVIX) 75 MG  tablet Take 1 tablet by mouth daily.    . metoprolol succinate (TOPROL-XL) 50 MG 24 hr tablet TAKE 1 TABLET EVERY DAY 90 tablet 3  . Multiple Vitamin (MULTIVITAMIN) tablet Take 1 tablet by mouth daily.    . Multiple Vitamins-Minerals (MULTIVITAMIN ADULT) TABS Take 1 tablet by mouth daily.    . ramipril (ALTACE) 10 MG capsule Take 1 capsule (10 mg total) by mouth daily. 90 capsule 3  . simvastatin (ZOCOR) 80 MG tablet TAKE 1 TABLET EVERY DAY  AT  6PM (Patient taking differently: Take 80 mg by mouth daily. ) 90 tablet 3    Allergies as of 06/20/2018 - Review Complete 06/20/2018  Allergen Reaction Noted  . Unasyn [ampicillin-sulbactam sodium] Rash 02/18/2012    Family History  Problem Relation Age of Onset  . Heart disease Father   . Cancer Mother        breast  . Stomach cancer Mother   . Cancer Maternal Aunt        breast  . Esophageal cancer Neg Hx   . Rectal cancer Neg Hx   . Colon cancer Neg Hx     Social History   Socioeconomic History  . Marital status: Divorced    Spouse name: Not on file  . Number of children: 1  . Years of education: Not on file  . Highest education level: Not on file  Occupational History  . Occupation: sales-retired  Social Needs  . Financial resource strain: Not on file  . Food insecurity:    Worry: Not on file    Inability: Not on file  . Transportation needs:    Medical: Not on file    Non-medical: Not on file  Tobacco Use  . Smoking status: Never Smoker  . Smokeless tobacco: Never Used  Substance and Sexual Activity  . Alcohol use: No    Comment: 12/05/2011 "stopped all alcohol > 25 years ago; never drank heavily"  . Drug use: No  . Sexual activity: Yes  Lifestyle  . Physical activity:    Days per week: Not on file    Minutes per session: Not on file  . Stress: Not on file  Relationships  . Social connections:    Talks on phone: Not on file    Gets together: Not on file    Attends religious service: Not on file    Active  member of club or organization: Not on file    Attends meetings of clubs or organizations: Not on file    Relationship status: Not on file  . Intimate partner violence:    Fear of current or ex partner: Not on file    Emotionally abused: Not on file    Physically abused: Not on file    Forced sexual activity: Not on file  Other Topics Concern  . Not on file  Social History Narrative  . Not on file    Review of Systems: All  systems reviewed and negative except where noted in HPI.  Physical Exam: Vital signs in last 24 hours: Temp:  [97.8 F (36.6 C)-98.4 F (36.9 C)] 97.8 F (36.6 C) (04/09 1321) Pulse Rate:  [58-125] 94 (04/09 1430) Resp:  [9-35] 18 (04/09 1430) BP: (98-160)/(53-99) 128/85 (04/09 1430) SpO2:  [93 %-98 %] 96 % (04/09 1430) Weight:  [97.1 kg] 97.1 kg (04/09 0718)   General:   Alert, well-developed, male in NAD Psych:  Pleasant, cooperative. Normal mood and affect. Eyes:  Pupils equal, sclera clear, no icterus.   Conjunctiva pink. Ears:  Normal auditory acuity. Nose:  No deformity, discharge,  or lesions. Neck:  Supple; no masses Lungs:  Clear throughout to auscultation.   No wheezes, crackles, or rhonchi.  Heart:  Regular rate and rhythm; no murmurs, no lower extremity edema Abdomen:  Soft, non-distended, nontender, BS active, no palp mass    Rectal:  Deferred  Msk:  Symmetrical without gross deformities. . Neurologic:  Alert and  oriented x4;  grossly normal neurologically. Skin:  Intact without significant lesions or rashes.   Intake/Output from previous day: No intake/output data recorded. Intake/Output this shift: No intake/output data recorded.  Lab Results: Recent Labs    06/20/18 1354  WBC 11.5*  HGB 14.6  HCT 44.2  PLT 212   BMET Recent Labs    06/20/18 1354  NA 141  K 3.5  CL 111  CO2 19*  GLUCOSE 97  BUN 13  CREATININE 1.04  CALCIUM 8.6*   LFT Recent Labs    06/20/18 1354  PROT 6.9  ALBUMIN 4.4  AST 19  ALT 17   ALKPHOS 79  BILITOT 3.4*   PT/INR No results for input(s): LABPROT, INR in the last 72 hours. Hepatitis Panel No results for input(s): HEPBSAG, HCVAB, HEPAIGM, HEPBIGM in the last 72 hours.    Studies/Results: No results found.   Tye Savoy, NP-C @  06/20/2018, 3:13 PM

## 2018-06-20 NOTE — Telephone Encounter (Signed)
Pt had procedure this morning, he stated that when he got home and went to the bathroom there was a substantial amount of blood in his pants, he feels fine but would like to speak with someone.

## 2018-06-20 NOTE — Telephone Encounter (Signed)
I called the patient.  He is certainly having more bleeding than I would expect from biopsies of the left colonic mass (particularly since there was no overwhelming bleeding at the time of his procedure).  However, he is on Plavix which he took today.  I have recommended that he go to [facility] for admission/observation.  We should hold his Plavix for now.  I will notify the inpatient GI team

## 2018-06-20 NOTE — Telephone Encounter (Signed)
Dr. Henrene Pastor,  When I spoke to the pts to give him your recommendations, he states that since I spoke with him previously, he has gone to the bathroom two more times- he is "filling the toilet with blood and passing some clots now."  [employer]

## 2018-06-20 NOTE — Progress Notes (Signed)
Lead 2 and 3, pre induction looked like may been a-fib, lead one the p wave was easier to see, ekg was irregular.  EKG on file listed SR with PACs.  Copy of EKG left to be filed

## 2018-06-20 NOTE — Progress Notes (Signed)
After admission to unit, pt stated he had to go to restroom for a BM.  Pt had another bright red, bloody stool with dark clots. Pt also began to vomit bile.  MD paged and made aware.  Zofran and abd xray ordered. Pt assisted back to bed.  Bowel prep to start at later time as pt is able to tolerate.

## 2018-06-20 NOTE — Progress Notes (Signed)
Report to PACU, RN, vss, BBS= Clear.  

## 2018-06-20 NOTE — Patient Instructions (Signed)
Handouts given for polyps and Diverticulosis.  Continue same medication INCLUDING Plavix  YOU HAD AN ENDOSCOPIC PROCEDURE TODAY AT [facility]:   Refer to the procedure report that was given to you for any specific questions about what was found during the examination.  If the procedure report does not answer your questions, please call your gastroenterologist to clarify.  If you requested that your care partner not be given the details of your procedure findings, then the procedure report has been included in a sealed envelope for you to review at your convenience later.  YOU SHOULD EXPECT: Some feelings of bloating in the abdomen. Passage of more gas than usual.  Walking can help get rid of the air that was put into your GI tract during the procedure and reduce the bloating. If you had a lower endoscopy (such as a colonoscopy or flexible sigmoidoscopy) you may notice spotting of blood in your stool or on the toilet paper. If you underwent a bowel prep for your procedure, you may not have a normal bowel movement for a few days.  Please Note:  You might notice some irritation and congestion in your nose or some drainage.  This is from the oxygen used during your procedure.  There is no need for concern and it should clear up in a day or so.  SYMPTOMS TO REPORT IMMEDIATELY:   Following lower endoscopy (colonoscopy or flexible sigmoidoscopy):  Excessive amounts of blood in the stool  Significant tenderness or worsening of abdominal pains  Swelling of the abdomen that is new, acute  Fever of 100F or higher   For urgent or emergent issues, a gastroenterologist can be reached at any hour by calling [phone].   DIET:  We do recommend a small meal at first, but then you may proceed to your regular diet.  Drink plenty of fluids but you should avoid alcoholic beverages for 24 hours.  ACTIVITY:  You should plan to take it easy for the rest of today and you should NOT DRIVE or  use heavy machinery until tomorrow (because of the sedation medicines used during the test).    FOLLOW UP: Our staff will call the number listed on your records the next business day following your procedure to check on you and address any questions or concerns that you may have regarding the information given to you following your procedure. If we do not reach you, we will leave a message.  However, if you are feeling well and you are not experiencing any problems, there is no need to return our call.  We will assume that you have returned to your regular daily activities without incident.  If any biopsies were taken you will be contacted by phone or by letter within the next 1-3 weeks.  Please call us at [phone] if you have not heard about the biopsies in 3 weeks.    SIGNATURES/CONFIDENTIALITY: You and/or your care partner have signed paperwork which will be entered into your electronic medical record.  These signatures attest to the fact that that the information above on your After Visit Summary has been reviewed and is understood.  Full responsibility of the confidentiality of this discharge information lies with you and/or your care-partner.

## 2018-06-20 NOTE — ED Triage Notes (Signed)
Pt reports that had colonoscopy this morning and was instructed not to stop his Plavix but had a growth biopsied. Reports since 1030am this morning had rectal bleeding. Was instructed by GI to come to ED.

## 2018-06-20 NOTE — Progress Notes (Signed)
Called to room to assist during endoscopic procedure.  Patient ID and intended procedure confirmed with present staff. Received instructions for my participation in the procedure from the performing physician.  Pathology not sent [facility] per DO

## 2018-06-20 NOTE — Telephone Encounter (Signed)
The bleeding is from the biopsies of the left colon mass.  He is on Plavix.  Continue to monitor closely.  There is no endoscopic therapy for bleeding biopsied mass.  If significant bleeding were to persist, we may need to hold his Plavix.  However, would continue to monitor closely for now.  Thank you

## 2018-06-20 NOTE — Telephone Encounter (Signed)
Thank you for the update. We will be on the look out for this. Thanks.

## 2018-06-20 NOTE — ED Notes (Signed)
Name/Age/Gender [name] 78 y.o. male  Code Status   Code Status: Prior  Home/SNF/Other Home  Chief Complaint Rectal Bleeding following Colonoscopy  Triage Note Pt reports that had colonoscopy this morning and was instructed not to stop his Plavix but had a growth biopsied. Reports since 1030am this morning had rectal bleeding. Was instructed by GI to come to ED.    Level of Care/Admitting Diagnosis ED Disposition    ED Disposition Condition Comment   Admit  Hospital Area: [facility] [[id number]]  Level of Care: Med-Surg [16]  Diagnosis: Rectal bleed [562563]  Admitting Physician: Hosie Poisson [[id number]]  Attending Physician: Hosie Poisson [[id number]]  PT Class (Do Not Modify): Observation [104]  PT Acc Code (Do Not Modify): Observation [10022]       Medical/Surgery History Past Medical History:  Diagnosis Date  . Acute cholecystitis 02/14/2012  . Anginal pain ([place])   . CAD (coronary artery disease) 2007   occlusion of mid LCX, 50-70% LAD  . Cataract 2010   bilateral eyes  . Choledocholithiasis 02/14/2012  . Dyslipidemia   . HTN (hypertension)   . Macular degeneration   . Myocardial infarction ([place]) 1/07   Acute non-ST-elevation myocardial infarction  . Overweight(278.02)    with body mass index of 29   Past Surgical History:  Procedure Laterality Date  . CARDIAC CATHETERIZATION  03/17/2005   Ejection fraction is estimated at 55%  . CATARACT EXTRACTION W/ INTRAOCULAR LENS  IMPLANT, BILATERAL  ~ 2010  . CHOLECYSTECTOMY  02/15/2012   Procedure: LAPAROSCOPIC CHOLECYSTECTOMY WITH INTRAOPERATIVE CHOLANGIOGRAM;  Surgeon: Stark Klein, MD;  Location: [facility];  Service: General;  Laterality: N/A;  . KNEE SURGERY  1980's   "took knee out and put it back in" ; left      Allergies Allergies  Allergen Reactions  . Unasyn [Ampicillin-Sulbactam Sodium] Rash    IV Location/Drains/Wounds Patient Lines/Drains/Airways Status   Active Line/Drains/Airways     Name:   Placement date:   Placement time:   Site:   Days:   Peripheral IV 06/20/18 Left Hand   06/20/18    1357    Hand   less than 1   Incision 02/15/12 Abdomen Other (Comment)   02/15/12    1237     2317   Incision - 4 Ports Abdomen 1: Umbilicus 2: Mid;Upper 3: Right;Medial 4: Right;Lateral   02/15/12    -     2317          Labs/Imaging Results for orders placed or performed during the hospital encounter of 06/20/18 (from the past 48 hour(s))  CBC with Differential     Status: Abnormal   Collection Time: 06/20/18  1:54 PM  Result Value Ref Range   WBC 11.5 (H) 4.0 - 10.5 K/uL   RBC 4.57 4.22 - 5.81 MIL/uL   Hemoglobin 14.6 13.0 - 17.0 g/dL   HCT 44.2 39.0 - 52.0 %   MCV 96.7 80.0 - 100.0 fL   MCH 31.9 26.0 - 34.0 pg   MCHC 33.0 30.0 - 36.0 g/dL   RDW 13.6 11.5 - 15.5 %   Platelets 212 150 - 400 K/uL   nRBC 0.0 0.0 - 0.2 %   Neutrophils Relative % 80 %   Neutro Abs 9.1 (H) 1.7 - 7.7 K/uL   Lymphocytes Relative 13 %   Lymphs Abs 1.5 0.7 - 4.0 K/uL   Monocytes Relative 4 %   Monocytes Absolute 0.5 0.1 - 1.0 K/uL   Eosinophils  Relative 3 %   Eosinophils Absolute 0.3 0.0 - 0.5 K/uL   Basophils Relative 0 %   Basophils Absolute 0.1 0.0 - 0.1 K/uL   Immature Granulocytes 0 %   Abs Immature Granulocytes 0.04 0.00 - 0.07 K/uL    Comment: Performed at [facility], [place] [address], [place], [place] [address]  Comprehensive metabolic panel     Status: Abnormal   Collection Time: 06/20/18  1:54 PM  Result Value Ref Range   Sodium 141 135 - 145 mmol/L   Potassium 3.5 3.5 - 5.1 mmol/L   Chloride 111 98 - 111 mmol/L   CO2 19 (L) 22 - 32 mmol/L   Glucose, Bld 97 70 - 99 mg/dL   BUN 13 8 - 23 mg/dL   Creatinine, Ser 1.04 0.61 - 1.24 mg/dL   Calcium 8.6 (L) 8.9 - 10.3 mg/dL   Total Protein 6.9 6.5 - 8.1 g/dL   Albumin 4.4 3.5 - 5.0 g/dL   AST 19 15 - 41 U/L   ALT 17 0 - 44 U/L   Alkaline Phosphatase 79 38 - 126 U/L   Total Bilirubin 3.4 (H) 0.3 - 1.2 mg/dL    GFR calc non Af Amer >60 >60 mL/min   GFR calc Af Amer >60 >60 mL/min   Anion gap 11 5 - 15    Comment: Performed at [facility], [place] [address] [place], [place] [address]  Lipase, blood     Status: None   Collection Time: 06/20/18  1:54 PM  Result Value Ref Range   Lipase 24 11 - 51 U/L    Comment: Performed at [facility], [place] [address], [place], [place] [address]  Urinalysis, Routine w reflex microscopic     Status: Abnormal   Collection Time: 06/20/18  1:54 PM  Result Value Ref Range   Color, Urine YELLOW YELLOW   APPearance CLEAR CLEAR   Specific Gravity, Urine 1.019 1.005 - 1.030   pH 5.0 5.0 - 8.0   Glucose, UA NEGATIVE NEGATIVE mg/dL   Hgb urine dipstick SMALL (A) NEGATIVE   Bilirubin Urine NEGATIVE NEGATIVE   Ketones, ur 80 (A) NEGATIVE mg/dL   Protein, ur NEGATIVE NEGATIVE mg/dL   Nitrite NEGATIVE NEGATIVE   Leukocytes,Ua NEGATIVE NEGATIVE   RBC / HPF 0-5 0 - 5 RBC/hpf   WBC, UA 0-5 0 - 5 WBC/hpf   Bacteria, UA NONE SEEN NONE SEEN   Squamous Epithelial / LPF 0-5 0 - 5   Mucus PRESENT    Hyaline Casts, UA PRESENT     Comment: Performed at [facility], [place] [address] [place], [place] [address]  Type and screen     Status: None   Collection Time: 06/20/18  1:54 PM  Result Value Ref Range   ABO/RH(D) A POS    Antibody Screen POS    Sample Expiration 06/23/2018    Antibody Identification      ANTI M Performed at [facility], [place] [address] [place], [place] [address]   POC occult blood, ED Provider will collect     Status: Abnormal   Collection Time: 06/20/18  2:08 PM  Result Value Ref Range   Fecal Occult Bld POSITIVE (A) NEGATIVE   No results found. None  Pending Labs Unresulted Labs (From admission, onward)    Start     Ordered   06/21/18 2119  Basic metabolic panel  Tomorrow morning,   R  06/20/18 1549   06/20/18 1800  Hemoglobin and hematocrit, blood  Every 12 hours  (non-specified),   R     06/20/18 1549          Vitals/Pain Today's Vitals   06/20/18 1321 06/20/18 1400 06/20/18 1430 06/20/18 1545  BP: 122/89 127/81 128/85 123/66  Pulse: (!) 125 83 94 78  Resp: 19 18 18 18   Temp: 97.8 F (36.6 C)     TempSrc: Oral     SpO2: 97% 93% 96% 98%  PainSc:        Intake/Output Last 24 hours No intake or output data in the 24 hours ending 06/20/18 1608  Isolation Precautions No active isolations  Medications Medications  sodium chloride 0.9 % bolus 1,000 mL (1,000 mLs Intravenous New Bag/Given 06/20/18 1405)    Mobility walks Low fall risk

## 2018-06-21 DIAGNOSIS — D62 Acute posthemorrhagic anemia: Secondary | ICD-10-CM

## 2018-06-21 DIAGNOSIS — I10 Essential (primary) hypertension: Secondary | ICD-10-CM | POA: Diagnosis not present

## 2018-06-21 DIAGNOSIS — K625 Hemorrhage of anus and rectum: Secondary | ICD-10-CM | POA: Diagnosis not present

## 2018-06-21 DIAGNOSIS — K6389 Other specified diseases of intestine: Secondary | ICD-10-CM | POA: Diagnosis not present

## 2018-06-21 DIAGNOSIS — Z7902 Long term (current) use of antithrombotics/antiplatelets: Secondary | ICD-10-CM | POA: Diagnosis not present

## 2018-06-21 LAB — BASIC METABOLIC PANEL
Anion gap: 8 (ref 5–15)
BUN: 10 mg/dL (ref 8–23)
CO2: 18 mmol/L — ABNORMAL LOW (ref 22–32)
Calcium: 7.9 mg/dL — ABNORMAL LOW (ref 8.9–10.3)
Chloride: 116 mmol/L — ABNORMAL HIGH (ref 98–111)
Creatinine, Ser: 0.92 mg/dL (ref 0.61–1.24)
GFR calc Af Amer: >60 mL/min (ref >60)
GFR calc non Af Amer: >60 mL/min (ref >60)
Glucose, Bld: 116 mg/dL — ABNORMAL HIGH (ref 70–99)
Potassium: 3.6 mmol/L (ref 3.5–5.1)
Sodium: 142 mmol/L (ref 135–145)

## 2018-06-21 LAB — HEMOGLOBIN AND HEMATOCRIT, BLOOD
HCT: 36.4 % — ABNORMAL LOW (ref 39.0–52.0)
Hemoglobin: 12 g/dL — ABNORMAL LOW (ref 13.0–17.0)

## 2018-06-21 MED ORDER — CLOPIDOGREL BISULFATE 75 MG PO TABS: 75.0000 mg | ORAL_TABLET | Freq: Every day | ORAL | Status: DC

## 2018-06-21 NOTE — Discharge Summary (Signed)
Physician Discharge Summary  [name] MPN:[id number] DOB: 1940/10/27 DOA: 06/20/2018  PCP: Haywood Pao, MD  Admit date: 06/20/2018   Discharge date: 06/21/2018  Admitted From: Home  Discharge disposition: Home   Recommendations for Outpatient Follow-Up:    Follow up with your primary care provider in one week.    Discharge Diagnosis:   Active Problems:   Essential hypertension   Rectal bleed  Discharge Condition: Improved.  Diet recommendation: Low sodium,   Wound care: None.  Code status: Full.   History of Present Illness:    [name] is a 78 y.o. male with medical history significant of CAD, Hypertension, dyslipidemia, macular degeneration, MI, presented to ED with large hematochezia,  status post colonoscopy by Dr Henrene Pastor, was discharged home for colonoscopy but had a persistent rectal bleeding so he presented back to the hospital. As per the colonoscopy report, he was found to have descending colon mass, non obstructing, non circumferential mass, underwent biopsy and cold snare removal of 5 mm polyp.  Patient reported that he took Plavix. On arrival to ED, pt was afebrile, tachycardic, hypotensive, and tachypneic. Labs revealed mild leukocytosis of 11.5, hemoglobin of 14.6 down from 17. UA Negative. Stool for occult blood was positive.    Hospital Course:   Patient was admitted to hospital in following conditions were addressed during hospitalization,  Persistent rectal bleeding  This was thought to be persistent bleeding from the biopsy site and the patient being on Plavix.  Plavix was kept on hold.  GI saw the patient.  He initially underwent colonoscopy preparation but GI did not wish to do repeat colonoscopy at this time.  Patient did not have further episodes of bleeding and was advised to hold off with Plavix for the next few days.  GI saw the patient and cleared the patient for discharge home.  Descending colon mass:  S/p biopsy,  suspicious for malignancy.  We will follow the report as outpatient.   Hyperlipidemia:  Resume home medications   Hypertension:  Resume home medication.  Blood pressure remained stable  H/o CAD Patient will restart Plavix in few days.  Disposition. Patient is considered stable for disposition home.  Patient has been seen by GI.  Patient will be followed by GI as outpatient with his biopsy report.  Patient was advised to seek medical attention for ongoing bleeding.   Medical Consultants:   GI Dr. Henrene Pastor  Subjective:   Today, patient feels okay.  He underwent colonoscopy preparation with clear stools but did not need to go through colonoscopy.  Patient did not have further episodes of bleeding.  Discharge Exam:   Vitals:   06/21/18 0614 06/21/18 1325  BP: 116/67 125/68  Pulse: 83 89  Resp: 18 16  Temp: 97.6 F (36.4 C) 98.6 F (37 C)  SpO2: 93% 95%   Vitals:   06/20/18 2131 06/20/18 2215 06/21/18 0614 06/21/18 1325  BP: 118/70 (!) 141/63 116/67 125/68  Pulse: 82 72 83 89  Resp: 18 18 18 16   Temp: 97.9 F (36.6 C) 97.9 F (36.6 C) 97.6 F (36.4 C) 98.6 F (37 C)  TempSrc: Oral Oral Oral Oral  SpO2: 95% 100% 93% 95%  Weight:      Height:        General exam: Appears calm and comfortable ,Not in distress HEENT:PERRL,Oral mucosa moist Respiratory system: Bilateral equal air entry, normal vesicular breath sounds, no wheezes or crackles  Cardiovascular system: S1 & S2 heard, RRR.  Gastrointestinal  system: Abdomen is nondistended, soft and nontender. No organomegaly or masses felt. Normal bowel sounds heard. Central nervous system: Alert and oriented. No focal neurological deficits. Extremities: No edema, no clubbing ,no cyanosis, distal peripheral pulses palpable. Skin: No rashes, lesions or ulcers,no icterus ,no pallor MSK: Normal muscle bulk,tone ,power    Procedures:    None  The results of significant diagnostics from this hospitalization  (including imaging, microbiology, ancillary and laboratory) are listed below for reference.     Diagnostic Studies:   Dg Abd 1 View  Result Date: 06/20/2018 CLINICAL DATA:  Constipation. Patient reports rectal bleeding since colonoscopy this morning. EXAM: ABDOMEN - 1 VIEW COMPARISON:  None. FINDINGS: Two supine views of the abdomen obtained. No evidence of free air on supine imaging. Air-filled colon with evidence of diverticulosis in the left colon. No small bowel obstruction or dilatation. Cholecystectomy clips in the right upper quadrant. No radiopaque calculi. No osseous abnormalities. IMPRESSION: 1. Air-filled colon consistent with recent colonoscopy. Colonic diverticulosis. 2. No evidence of free air on supine views. Electronically Signed   By: Keith Rake M.D.   On: 06/20/2018 19:31     Labs:   Basic Metabolic Panel: Recent Labs  Lab 06/20/18 1354 06/21/18 0513  NA 141 142  K 3.5 3.6  CL 111 116*  CO2 19* 18*  GLUCOSE 97 116*  BUN 13 10  CREATININE 1.04 0.92  CALCIUM 8.6* 7.9*   GFR Estimated Creatinine Clearance: 75.7 mL/min (by C-G formula based on SCr of 0.92 mg/dL). Liver Function Tests: Recent Labs  Lab 06/20/18 1354  AST 19  ALT 17  ALKPHOS 79  BILITOT 3.4*  PROT 6.9  ALBUMIN 4.4   Recent Labs  Lab 06/20/18 1354  LIPASE 24   No results for input(s): AMMONIA in the last 168 hours. Coagulation profile No results for input(s): INR, PROTIME in the last 168 hours.  CBC: Recent Labs  Lab 06/20/18 1354 06/20/18 1745 06/21/18 0513  WBC 11.5*  --   --   NEUTROABS 9.1*  --   --   HGB 14.6 12.7* 12.0*  HCT 44.2 39.0 36.4*  MCV 96.7  --   --   PLT 212  --   --    Cardiac Enzymes: No results for input(s): CKTOTAL, CKMB, CKMBINDEX, TROPONINI in the last 168 hours. BNP: Invalid input(s): POCBNP CBG: No results for input(s): GLUCAP in the last 168 hours. D-Dimer No results for input(s): DDIMER in the last 72 hours. Hgb A1c No results for  input(s): HGBA1C in the last 72 hours. Lipid Profile No results for input(s): CHOL, HDL, LDLCALC, TRIG, CHOLHDL, LDLDIRECT in the last 72 hours. Thyroid function studies No results for input(s): TSH, T4TOTAL, T3FREE, THYROIDAB in the last 72 hours.  Invalid input(s): FREET3 Anemia work up No results for input(s): VITAMINB12, FOLATE, FERRITIN, TIBC, IRON, RETICCTPCT in the last 72 hours. Microbiology No results found for this or any previous visit (from the past 240 hour(s)).   Discharge Instructions:   Discharge Instructions    Diet - low sodium heart healthy   Complete by:  As directed    Discharge instructions   Complete by:  As directed    Follow-up with GI after discharge.  Seek medical attention for worsening symptoms, including ongoing bleeding.  Please do not take Plavix for the next 2 days.   Increase activity slowly   Complete by:  As directed      Allergies as of 06/21/2018      Reactions  Unasyn [ampicillin-sulbactam Sodium] Rash      Medication List    TAKE these medications   clopidogrel 75 MG tablet Commonly known as:  PLAVIX Take 1 tablet (75 mg total) by mouth daily. Start 06/24/2018 What changed:  additional instructions   metoprolol succinate 50 MG 24 hr tablet Commonly known as:  TOPROL-XL TAKE 1 TABLET EVERY DAY   Multivitamin Adult Tabs Take 1 tablet by mouth daily.   multivitamin tablet Take 1 tablet by mouth daily.   ramipril 10 MG capsule Commonly known as:  ALTACE Take 1 capsule (10 mg total) by mouth daily.   simvastatin 80 MG tablet Commonly known as:  ZOCOR TAKE 1 TABLET EVERY DAY  AT  6PM What changed:  See the new instructions.      Time coordinating discharge: 39 minutes  Signed:  Ishitha Roper  [facility] 06/21/2018, 3:17 PM

## 2018-06-21 NOTE — Progress Notes (Signed)
Patient is being d/c'd home. D/c paperwork given and IV removed.

## 2018-06-21 NOTE — Progress Notes (Addendum)
[facility] Progress Note   Chief Complaint:   Rectal bleeding   SUBJECTIVE:    no complaints. No bleeding since last night.    ASSESSMENT AND PLAN:   78 year old male hematochezia on plavix following colonoscopy with biopsies of left colon lesion and resection of small IVC polyp via cold snare. Resolved. Pass some dark blood with first dose of bowel prep and no bleeding with the second dose around 3am.  -No need to proceed with colonoscopy. Will give clear liquids. If no recurrent bleeding, ? Maybe home later today or in am.  -Recommended holding plavix for 3 days   2.  ABL anemia. Hgb 12, down from 14.6 yesterday. Overall down ~5 grams from baseline of 17.  -Recommend PO BD iron at time of discharge  X 4-6 weeks.  -Colace Q HS while on iron.    OBJECTIVE:     Vital signs in last 24 hours: Temp:  [97.6 F (36.4 C)-97.9 F (36.6 C)] 97.6 F (36.4 C) (04/10 9833) Pulse Rate:  [72-125] 83 (04/10 0614) Resp:  [18-19] 18 (04/10 0614) BP: (116-141)/(63-89) 116/67 (04/10 0614) SpO2:  [93 %-100 %] 93 % (04/10 8250) Weight:  [95.3 kg] 95.3 kg (04/09 1700) Last BM Date: 06/20/18 General:   Alert, well-developed male in NAD EENT:  Normal hearing, non icteric sclera, conjunctive pink.  Heart:  Regular rate and rhythm.  No lower extremity edema   Pulm: Normal respiratory effort, lungs CTA bilaterally without wheezes or crackles. Abdomen:  Soft, nondistended, nontender.  Normal bowel sounds, no masses felt.       Neurologic:  Alert and  oriented x4;  grossly normal neurologically. Psych:  Pleasant, cooperative.  Normal mood and affect.   Intake/Output from previous day: 04/09 0701 - 04/10 0700 In: 4065.4 [P.O.:3120; I.V.:945.4] Out: -  Intake/Output this shift: No intake/output data recorded.  Lab Results: Recent Labs    06/20/18 1354 06/20/18 1745 06/21/18 0513  WBC 11.5*  --   --   HGB 14.6 12.7* 12.0*  HCT 44.2 39.0 36.4*  PLT 212  --   --     BMET Recent Labs    06/20/18 1354 06/21/18 0513  NA 141 142  K 3.5 3.6  CL 111 116*  CO2 19* 18*  GLUCOSE 97 116*  BUN 13 10  CREATININE 1.04 0.92  CALCIUM 8.6* 7.9*   LFT Recent Labs    06/20/18 1354  PROT 6.9  ALBUMIN 4.4  AST 19  ALT 17  ALKPHOS 79  BILITOT 3.4*   PT/INR No results for input(s): LABPROT, INR in the last 72 hours. Hepatitis Panel No results for input(s): HEPBSAG, HCVAB, HEPAIGM, HEPBIGM in the last 72 hours.  Dg Abd 1 View  Result Date: 06/20/2018 CLINICAL DATA:  Constipation. Patient reports rectal bleeding since colonoscopy this morning. EXAM: ABDOMEN - 1 VIEW COMPARISON:  None. FINDINGS: Two supine views of the abdomen obtained. No evidence of free air on supine imaging. Air-filled colon with evidence of diverticulosis in the left colon. No small bowel obstruction or dilatation. Cholecystectomy clips in the right upper quadrant. No radiopaque calculi. No osseous abnormalities. IMPRESSION: 1. Air-filled colon consistent with recent colonoscopy. Colonic diverticulosis. 2. No evidence of free air on supine views. Electronically Signed   By: Keith Rake M.D.   On: 06/20/2018 19:31      Active Problems:   Essential hypertension   Rectal bleed     LOS: 0 days   Tye Savoy ,NP 06/21/2018,  9:47 AM  GI ATTENDING  Interval history data reviewed.  Patient seen and examined.  Agree with interval progress note as outlined above.  The patient is feeling well.  No bleeding since last night.  Hemoglobin stabilized at 12.  He is hungry.  Bleeding from biopsies of left colon mass on Plavix.  We will allow regular diet and plan on discharge home this afternoon.  I have asked him to hold his Plavix for 2 days then resume.  I will be in touch with him regarding his biopsy results and further plans regarding his left-sided colonic lesion.  Docia Chuck. Geri Seminole., M.D. [facility] G[facility]enter[facility]

## 2018-06-24 ENCOUNTER — Telehealth: Payer: Self-pay | Admitting: *Deleted

## 2018-06-24 NOTE — Telephone Encounter (Signed)
Message left

## 2018-07-01 ENCOUNTER — Telehealth: Payer: Self-pay | Admitting: Internal Medicine

## 2018-07-01 NOTE — Telephone Encounter (Signed)
Patient would like to know his pathology results from his procedure done on 4-09

## 2018-07-01 NOTE — Telephone Encounter (Signed)
Patient calling for pathology results. Please advise.

## 2018-07-02 ENCOUNTER — Encounter: Payer: Self-pay | Admitting: Internal Medicine

## 2018-07-02 NOTE — Telephone Encounter (Signed)
I have contacted the patient with the results.  Instructions are attached to the pathology result note section.  Thanks

## 2018-07-19 DIAGNOSIS — Z125 Encounter for screening for malignant neoplasm of prostate: Secondary | ICD-10-CM | POA: Diagnosis not present

## 2018-07-19 DIAGNOSIS — I10 Essential (primary) hypertension: Secondary | ICD-10-CM | POA: Diagnosis not present

## 2018-07-19 DIAGNOSIS — R7302 Impaired glucose tolerance (oral): Secondary | ICD-10-CM | POA: Diagnosis not present

## 2018-07-19 DIAGNOSIS — E7800 Pure hypercholesterolemia, unspecified: Secondary | ICD-10-CM | POA: Diagnosis not present

## 2018-07-22 DIAGNOSIS — I10 Essential (primary) hypertension: Secondary | ICD-10-CM | POA: Diagnosis not present

## 2018-07-22 DIAGNOSIS — R82998 Other abnormal findings in urine: Secondary | ICD-10-CM | POA: Diagnosis not present

## 2018-07-24 ENCOUNTER — Other Ambulatory Visit: Payer: Self-pay | Admitting: Surgery

## 2018-07-24 DIAGNOSIS — K635 Polyp of colon: Secondary | ICD-10-CM | POA: Diagnosis not present

## 2018-07-24 DIAGNOSIS — Z7901 Long term (current) use of anticoagulants: Secondary | ICD-10-CM | POA: Diagnosis not present

## 2018-07-25 ENCOUNTER — Telehealth: Payer: Self-pay

## 2018-07-25 NOTE — Telephone Encounter (Signed)
   [facility] Pre-operative Risk Assessment    Request for surgical clearance:  1. What type of surgery is being performed? Laparoscopic assisted left colectomy  2. When is this surgery scheduled? TBD   3. What type of clearance is required (medical clearance vs. Pharmacy clearance to hold med vs. Both)? Both  4. Are there any medications that need to be held prior to surgery and how long?Plavix- 5 days prior   5. Practice name and name of physician performing surgery? [facility]   6. What is your office phone number [phone]    7.   What is your office fax number [phone]  8.   Anesthesia type (None, local, MAC, general) ? Unknown    Meryl Crutch 07/25/2018, 8:05 AM  _________________________________________________________________   (provider comments below)

## 2018-07-25 NOTE — Telephone Encounter (Signed)
Left message for pt to call back. He has a remote CAD hx of MI in 2007. He was on aspirin but was switched to plavix in 2015 for "mini stroke". Will review his current status and can clear if no new cardiac complaints. OK to hold plavix from cardiac standpoint, however, will need to also refer to neurology as they are prescribing the plavix.

## 2018-07-25 NOTE — Telephone Encounter (Signed)
Follow up  ° ° °Pt is returning call  ° ° °Please call back  °

## 2018-07-26 DIAGNOSIS — H353232 Exudative age-related macular degeneration, bilateral, with inactive choroidal neovascularization: Secondary | ICD-10-CM | POA: Diagnosis not present

## 2018-07-26 DIAGNOSIS — Z0000 Encounter for general adult medical examination without abnormal findings: Secondary | ICD-10-CM | POA: Diagnosis not present

## 2018-07-26 DIAGNOSIS — D126 Benign neoplasm of colon, unspecified: Secondary | ICD-10-CM | POA: Diagnosis not present

## 2018-07-26 DIAGNOSIS — R7302 Impaired glucose tolerance (oral): Secondary | ICD-10-CM | POA: Diagnosis not present

## 2018-07-26 DIAGNOSIS — E7800 Pure hypercholesterolemia, unspecified: Secondary | ICD-10-CM | POA: Diagnosis not present

## 2018-07-26 DIAGNOSIS — I679 Cerebrovascular disease, unspecified: Secondary | ICD-10-CM | POA: Diagnosis not present

## 2018-07-26 DIAGNOSIS — I2510 Atherosclerotic heart disease of native coronary artery without angina pectoris: Secondary | ICD-10-CM | POA: Diagnosis not present

## 2018-07-26 DIAGNOSIS — I872 Venous insufficiency (chronic) (peripheral): Secondary | ICD-10-CM | POA: Diagnosis not present

## 2018-07-26 DIAGNOSIS — I252 Old myocardial infarction: Secondary | ICD-10-CM | POA: Diagnosis not present

## 2018-07-26 DIAGNOSIS — Z1331 Encounter for screening for depression: Secondary | ICD-10-CM | POA: Diagnosis not present

## 2018-07-26 DIAGNOSIS — I119 Hypertensive heart disease without heart failure: Secondary | ICD-10-CM | POA: Diagnosis not present

## 2018-07-26 DIAGNOSIS — J309 Allergic rhinitis, unspecified: Secondary | ICD-10-CM | POA: Diagnosis not present

## 2018-07-26 DIAGNOSIS — Z1339 Encounter for screening examination for other mental health and behavioral disorders: Secondary | ICD-10-CM | POA: Diagnosis not present

## 2018-07-26 DIAGNOSIS — E663 Overweight: Secondary | ICD-10-CM | POA: Diagnosis not present

## 2018-07-26 NOTE — Telephone Encounter (Signed)
Follow up    Patient is returning call in reference to pre-op clearance. He will not be available between 9am and 930am

## 2018-07-26 NOTE — Telephone Encounter (Signed)
   Primary Cardiologist: Peter Martinique, MD  Chart reviewed as part of pre-operative protocol coverage. Patient was contacted 07/26/2018 in reference to pre-operative risk assessment for pending surgery as outlined below.  [name] was last seen on 04/11/2018 by Dr. Martinique.  Since that day, [name] has done very well. He has a remote history of MI in 2007 and no cardiac issues since then. He works every day and walks 3-4 miles, 4-5 times per week without any exertional symptoms. He is on Plavix for a ministroke in 2015.   It is OK to hold Plavix for the procedure from a cardiac standpoint, however, the plavix is actually ordered by his GP and would need to be cleared by Dr. Osborne Casco or Dr. Louanne Belton.   Therefore, based on ACC/AHA guidelines, the patient would be at acceptable risk for the planned procedure without further cardiovascular testing.   I will route this recommendation to the requesting party via Epic fax function and remove from pre-op pool.  Please call with questions.  Daune Perch, NP 07/26/2018, 9:16 AM

## 2018-08-30 ENCOUNTER — Encounter (HOSPITAL_COMMUNITY): Payer: Self-pay | Admitting: Emergency Medicine

## 2018-08-30 ENCOUNTER — Other Ambulatory Visit: Payer: Self-pay

## 2018-08-30 ENCOUNTER — Inpatient Hospital Stay (HOSPITAL_COMMUNITY)
Admission: EM | Admit: 2018-08-30 | Discharge: 2018-09-02 | DRG: 393 | Disposition: A | Discharge status: Home / Self Care | Payer: Medicare Other | Attending: Family Medicine | Admitting: Family Medicine

## 2018-08-30 DIAGNOSIS — Z800 Family history of malignant neoplasm of digestive organs: Secondary | ICD-10-CM

## 2018-08-30 DIAGNOSIS — Z7902 Long term (current) use of antithrombotics/antiplatelets: Secondary | ICD-10-CM

## 2018-08-30 DIAGNOSIS — K5731 Diverticulosis of large intestine without perforation or abscess with bleeding: Secondary | ICD-10-CM | POA: Diagnosis present

## 2018-08-30 DIAGNOSIS — K5790 Diverticulosis of intestine, part unspecified, without perforation or abscess without bleeding: Secondary | ICD-10-CM

## 2018-08-30 DIAGNOSIS — I2510 Atherosclerotic heart disease of native coronary artery without angina pectoris: Secondary | ICD-10-CM | POA: Diagnosis present

## 2018-08-30 DIAGNOSIS — K625 Hemorrhage of anus and rectum: Secondary | ICD-10-CM | POA: Diagnosis not present

## 2018-08-30 DIAGNOSIS — D124 Benign neoplasm of descending colon: Principal | ICD-10-CM | POA: Diagnosis present

## 2018-08-30 DIAGNOSIS — H3530 Unspecified macular degeneration: Secondary | ICD-10-CM | POA: Diagnosis present

## 2018-08-30 DIAGNOSIS — Z03818 Encounter for observation for suspected exposure to other biological agents ruled out: Secondary | ICD-10-CM | POA: Diagnosis not present

## 2018-08-30 DIAGNOSIS — E785 Hyperlipidemia, unspecified: Secondary | ICD-10-CM | POA: Diagnosis present

## 2018-08-30 DIAGNOSIS — K922 Gastrointestinal hemorrhage, unspecified: Secondary | ICD-10-CM | POA: Diagnosis present

## 2018-08-30 DIAGNOSIS — D62 Acute posthemorrhagic anemia: Secondary | ICD-10-CM | POA: Diagnosis present

## 2018-08-30 DIAGNOSIS — I951 Orthostatic hypotension: Secondary | ICD-10-CM | POA: Diagnosis not present

## 2018-08-30 DIAGNOSIS — K921 Melena: Secondary | ICD-10-CM | POA: Diagnosis present

## 2018-08-30 DIAGNOSIS — Z1159 Encounter for screening for other viral diseases: Secondary | ICD-10-CM | POA: Diagnosis not present

## 2018-08-30 DIAGNOSIS — I252 Old myocardial infarction: Secondary | ICD-10-CM

## 2018-08-30 DIAGNOSIS — Z881 Allergy status to other antibiotic agents status: Secondary | ICD-10-CM

## 2018-08-30 DIAGNOSIS — Z79899 Other long term (current) drug therapy: Secondary | ICD-10-CM

## 2018-08-30 DIAGNOSIS — D369 Benign neoplasm, unspecified site: Secondary | ICD-10-CM

## 2018-08-30 DIAGNOSIS — Z8719 Personal history of other diseases of the digestive system: Secondary | ICD-10-CM

## 2018-08-30 DIAGNOSIS — Z8249 Family history of ischemic heart disease and other diseases of the circulatory system: Secondary | ICD-10-CM

## 2018-08-30 DIAGNOSIS — Z9842 Cataract extraction status, left eye: Secondary | ICD-10-CM

## 2018-08-30 DIAGNOSIS — Z9841 Cataract extraction status, right eye: Secondary | ICD-10-CM

## 2018-08-30 DIAGNOSIS — Z8673 Personal history of transient ischemic attack (TIA), and cerebral infarction without residual deficits: Secondary | ICD-10-CM

## 2018-08-30 DIAGNOSIS — I10 Essential (primary) hypertension: Secondary | ICD-10-CM | POA: Diagnosis present

## 2018-08-30 DIAGNOSIS — Z9049 Acquired absence of other specified parts of digestive tract: Secondary | ICD-10-CM

## 2018-08-30 DIAGNOSIS — Z961 Presence of intraocular lens: Secondary | ICD-10-CM | POA: Diagnosis present

## 2018-08-30 LAB — HEMOGLOBIN AND HEMATOCRIT, BLOOD
HCT: 33 % — ABNORMAL LOW (ref 39.0–52.0)
Hemoglobin: 10.6 g/dL — ABNORMAL LOW (ref 13.0–17.0)

## 2018-08-30 LAB — COMPREHENSIVE METABOLIC PANEL
ALT: 15 U/L (ref 0–44)
AST: 15 U/L (ref 15–41)
Albumin: 3.9 g/dL (ref 3.5–5.0)
Alkaline Phosphatase: 63 U/L (ref 38–126)
Anion gap: 10 (ref 5–15)
BUN: 15 mg/dL (ref 8–23)
CO2: 23 mmol/L (ref 22–32)
Calcium: 8.7 mg/dL — ABNORMAL LOW (ref 8.9–10.3)
Chloride: 110 mmol/L (ref 98–111)
Creatinine, Ser: 1.08 mg/dL (ref 0.61–1.24)
GFR calc Af Amer: >60 mL/min (ref >60)
GFR calc non Af Amer: >60 mL/min (ref >60)
Glucose, Bld: 146 mg/dL — ABNORMAL HIGH (ref 70–99)
Potassium: 3.7 mmol/L (ref 3.5–5.1)
Sodium: 143 mmol/L (ref 135–145)
Total Bilirubin: 1 mg/dL (ref 0.3–1.2)
Total Protein: 6.6 g/dL (ref 6.5–8.1)

## 2018-08-30 LAB — CBC
HCT: 44.7 % (ref 39.0–52.0)
Hemoglobin: 14.6 g/dL (ref 13.0–17.0)
MCH: 31.5 pg (ref 26.0–34.0)
MCHC: 32.7 g/dL (ref 30.0–36.0)
MCV: 96.5 fL (ref 80.0–100.0)
Platelets: 280 10*3/uL (ref 150–400)
RBC: 4.63 MIL/uL (ref 4.22–5.81)
RDW: 13 % (ref 11.5–15.5)
WBC: 10.3 10*3/uL (ref 4.0–10.5)
nRBC: 0 % (ref 0.0–0.2)

## 2018-08-30 LAB — TYPE AND SCREEN
ABO/RH(D): A POS
Antibody Screen: POSITIVE

## 2018-08-30 LAB — SARS CORONAVIRUS 2 BY RT PCR (HOSPITAL ORDER, PERFORMED IN ~~LOC~~ HOSPITAL LAB): SARS Coronavirus 2: NEGATIVE

## 2018-08-30 MED ORDER — RAMIPRIL 10 MG PO CAPS: 10.0000 mg | ORAL_CAPSULE | Freq: Every day | ORAL | Status: DC

## 2018-08-30 MED ORDER — METOPROLOL SUCCINATE ER 50 MG PO TB24: 50.0000 mg | ORAL_TABLET | Freq: Every day | ORAL | Status: DC

## 2018-08-30 MED ORDER — SODIUM CHLORIDE 0.9 % IV SOLN
INTRAVENOUS | Status: DC
  Administered 2018-08-30 – 2018-09-02 (×5): via INTRAVENOUS

## 2018-08-30 MED ORDER — SODIUM CHLORIDE 0.9 % IV BOLUS
1000.0000 mL | Freq: Once | INTRAVENOUS | Status: AC
  Administered 2018-08-30: 08:00:00 1000 mL via INTRAVENOUS

## 2018-08-30 MED ORDER — ATORVASTATIN CALCIUM 40 MG PO TABS
40.0000 mg | ORAL_TABLET | Freq: Every day | ORAL | Status: DC
  Administered 2018-08-30 – 2018-09-01 (×3): 40 mg via ORAL
  Filled 2018-08-30 (×3): qty 1

## 2018-08-30 NOTE — Consult Note (Addendum)
Referring Provider:   [facility]        Primary Care Physician:  Haywood Pao, MD Primary Gastroenterologist:  Scarlette Shorts, MD           Reason for Consultation:   hematochezia                ASSESSMENT /  PLAN    1. 78 yo male with recurrent painless hematochezia on plavix (last dose yesterday am). Bleeding may be a diverticular hemorrhage vrs bleeding from known large descending colon colon polyp. He is hemodynamically stable, hgb 14.6.  -Supportive care for now with trending of hemoglobin -clear liquids only for now  2. Large descending colon serrated adenoma. For colon resection with Dr. [name] on 09/17/18  3. Hx of TIA, on Plavix.     Attending Physician Note   I have taken a history, examined the patient and reviewed the chart. I agree with the Advanced Practitioner's note, impression and recommendations.  Recurrent, painless hematochezia on Plavix: diverticular vs descending colon polyp. Diverticulosis involves entire colon.   Hold Plavix Trend CBC Clear liquids If active bleeding persists nuclear tagged RBC scan and mesenteric angiogram Colon resection planned on 7/7  Lucio Edward, MD [facility] [phone]     BRIEF HISTORY   [name] is a 78 y.o. male with hx of TIA, CAD/ MI in 2007, HTN and diverticulosis. Admitted in early April for lower GI bleeding following colonoscopy with biopsy. Bleeding ceased with bowel prep, colonoscopy wasn't repeated. He has a large serrated adenoma in the descending colon and is scheduled for colon resection on 09/17/18. In the interim he has returned to ED with recurrent painless hematochezia on Plavix.    ED EVALUATION   1. 77 recurrent painless hematochezia on plavix. This may be  2. Hx of TIA, on plavix.  HPI:   Patient woke up at 4am with urge to defecate. He subsequently had a BM with a large amount of bright blood. He then had three or more moderate sized bloody stool. No associated abdominal pain. Some transient  lightheadedness.    PREVIOUS GASTROINTESTINAL STUDIES   06/20/18 Colonoscopy, complete with good prep -Likely malignant tumor in the descending colon. Biopsied. Tattooed. Path c/w serrated adenoma. - One 5 mm polyp at the ileocecal valve, removed with a cold snare. Resected and retrieved. Path c/w TA without HGD - Diverticulosis in the entire examined colon. - The examination was otherwise normal on direct and retroflexion views.   Past Medical History:  Diagnosis Date  . Acute cholecystitis 02/14/2012  . Anginal pain ([place])   . CAD (coronary artery disease) 2007   occlusion of mid LCX, 50-70% LAD  . Cataract 2010   bilateral eyes  . Choledocholithiasis 02/14/2012  . Dyslipidemia   . HTN (hypertension)   . Macular degeneration   . Myocardial infarction ([place]) 1/07   Acute non-ST-elevation myocardial infarction  . Overweight(278.02)    with body mass index of 29    Past Surgical History:  Procedure Laterality Date  . CARDIAC CATHETERIZATION  03/17/2005   Ejection fraction is estimated at 55%  . CATARACT EXTRACTION W/ INTRAOCULAR LENS  IMPLANT, BILATERAL  ~ 2010  . CHOLECYSTECTOMY  02/15/2012   Procedure: LAPAROSCOPIC CHOLECYSTECTOMY WITH INTRAOPERATIVE CHOLANGIOGRAM;  Surgeon: Shalissa Easterwood Klein, MD;  Location: [place];  Service: General;  Laterality: N/A;  . KNEE SURGERY  1980's   "took knee out and put it back in" ; left     Prior to Admission  medications   Medication Sig Start Date End Date Taking? Authorizing Provider  clopidogrel (PLAVIX) 75 MG tablet Take 1 tablet (75 mg total) by mouth daily. Start 06/24/2018 06/21/18  Yes Pokhrel, Laxman, MD  metoprolol succinate (TOPROL-XL) 50 MG 24 hr tablet TAKE 1 TABLET EVERY DAY Patient taking differently: Take 50 mg by mouth daily.  02/11/18  Yes Martinique, Peter M, MD  Multiple Vitamin (MULTIVITAMIN) tablet Take 1 tablet by mouth daily.   Yes [provider]  ramipril (ALTACE) 10 MG capsule Take 1 capsule (10 mg total) by mouth  daily. 05/08/18  Yes Martinique, Peter M, MD  simvastatin (ZOCOR) 80 MG tablet TAKE 1 TABLET EVERY DAY  AT  6PM Patient taking differently: Take 80 mg by mouth daily.  02/11/18  Yes Martinique, Peter M, MD    Current Facility-Administered Medications  Medication Dose Route Frequency Provider Last Rate Last Dose  . 0.9 %  sodium chloride infusion   Intravenous Continuous Adhikari, Amrit, MD      . atorvastatin (LIPITOR) tablet 40 mg  40 mg Oral q1800 Shelly Coss, MD       Current Outpatient Medications  Medication Sig Dispense Refill  . clopidogrel (PLAVIX) 75 MG tablet Take 1 tablet (75 mg total) by mouth daily. Start 06/24/2018    . metoprolol succinate (TOPROL-XL) 50 MG 24 hr tablet TAKE 1 TABLET EVERY DAY (Patient taking differently: Take 50 mg by mouth daily. ) 90 tablet 3  . Multiple Vitamin (MULTIVITAMIN) tablet Take 1 tablet by mouth daily.    . ramipril (ALTACE) 10 MG capsule Take 1 capsule (10 mg total) by mouth daily. 90 capsule 3  . simvastatin (ZOCOR) 80 MG tablet TAKE 1 TABLET EVERY DAY  AT  6PM (Patient taking differently: Take 80 mg by mouth daily. ) 90 tablet 3    Allergies as of 08/30/2018 - Review Complete 08/30/2018  Allergen Reaction Noted  . Unasyn [ampicillin-sulbactam sodium] Rash 02/18/2012    Family History  Problem Relation Age of Onset  . Heart disease Father   . Cancer Mother        breast  . Stomach cancer Mother   . Cancer Maternal Aunt        breast  . Esophageal cancer Neg Hx   . Rectal cancer Neg Hx   . Colon cancer Neg Hx     Social History   Socioeconomic History  . Marital status: Divorced    Spouse name: Not on file  . Number of children: 1  . Years of education: Not on file  . Highest education level: Not on file  Occupational History  . Occupation: sales-retired  Social Needs  . Financial resource strain: Not on file  . Food insecurity    Worry: Not on file    Inability: Not on file  . Transportation needs    Medical: Not on file     Non-medical: Not on file  Tobacco Use  . Smoking status: Never Smoker  . Smokeless tobacco: Never Used  Substance and Sexual Activity  . Alcohol use: No    Comment: 12/05/2011 "stopped all alcohol > 25 years ago; never drank heavily"  . Drug use: No  . Sexual activity: Yes  Lifestyle  . Physical activity    Days per week: Not on file    Minutes per session: Not on file  . Stress: Not on file  Relationships  . Social connections    Talks on phone: Not on file  Gets together: Not on file    Attends religious service: Not on file    Active member of club or organization: Not on file    Attends meetings of clubs or organizations: Not on file    Relationship status: Not on file  . Intimate partner violence    Fear of current or ex partner: Not on file    Emotionally abused: Not on file    Physically abused: Not on file    Forced sexual activity: Not on file  Other Topics Concern  . Not on file  Social History Narrative  . Not on file    Review of Systems: All systems reviewed and negative except where noted in HPI.  Physical Exam: Vital signs in last 24 hours: Temp:  [98 F (36.7 C)] 98 F (36.7 C) (06/19 0532) Pulse Rate:  [56-89] 84 (06/19 0855) Resp:  [13-21] 13 (06/19 0855) BP: (121-161)/(64-90) 161/87 (06/19 0855) SpO2:  [97 %-100 %] 98 % (06/19 0855)   General:   Alert, well-developed, male in NAD Psych:  Pleasant, cooperative. Normal mood and affect. Eyes:  Pupils equal, sclera clear, no icterus.   Conjunctiva pink. Ears:  Normal auditory acuity. Nose:  No deformity, discharge,  or lesions. Neck:  Supple; no masses Lungs:  Clear throughout to auscultation.   No wheezes, crackles, or rhonchi.  Heart:  Regular rate and rhythm; no murmurs, no lower extremity edema Abdomen:  Soft, non-distended, nontender, BS active, no palp mass    Rectal:  Deferred  Msk:  Symmetrical without gross deformities. . Neurologic:  Alert and  oriented x4;  grossly normal  neurologically. Skin:  Intact without significant lesions or rashes.   Intake/Output from previous day: No intake/output data recorded. Intake/Output this shift: No intake/output data recorded.  Lab Results: Recent Labs    08/30/18 0627  WBC 10.3  HGB 14.6  HCT 44.7  PLT 280   BMET Recent Labs    08/30/18 0627  NA 143  K 3.7  CL 110  CO2 23  GLUCOSE 146*  BUN 15  CREATININE 1.08  CALCIUM 8.7*   LFT Recent Labs    08/30/18 0627  PROT 6.6  ALBUMIN 3.9  AST 15  ALT 15  ALKPHOS 63  BILITOT 1.0   PT/INR No results for input(s): LABPROT, INR in the last 72 hours. Hepatitis Panel No results for input(s): HEPBSAG, HCVAB, HEPAIGM, HEPBIGM in the last 72 hours.   . CBC Latest Ref Rng & Units 08/30/2018 06/21/2018 06/20/2018  WBC 4.0 - 10.5 K/uL 10.3 - -  Hemoglobin 13.0 - 17.0 g/dL 14.6 12.0(L) 12.7(L)  Hematocrit 39.0 - 52.0 % 44.7 36.4(L) 39.0  Platelets 150 - 400 K/uL 280 - -    . CMP Latest Ref Rng & Units 08/30/2018 06/21/2018 06/20/2018  Glucose 70 - 99 mg/dL 146(H) 116(H) 97  BUN 8 - 23 mg/dL 15 10 13   Creatinine 0.61 - 1.24 mg/dL 1.08 0.92 1.04  Sodium 135 - 145 mmol/L 143 142 141  Potassium 3.5 - 5.1 mmol/L 3.7 3.6 3.5  Chloride 98 - 111 mmol/L 110 116(H) 111  CO2 22 - 32 mmol/L 23 18(L) 19(L)  Calcium 8.9 - 10.3 mg/dL 8.7(L) 7.9(L) 8.6(L)  Total Protein 6.5 - 8.1 g/dL 6.6 - 6.9  Total Bilirubin 0.3 - 1.2 mg/dL 1.0 - 3.4(H)  Alkaline Phos 38 - 126 U/L 63 - 79  AST 15 - 41 U/L 15 - 19  ALT 0 - 44 U/L 15 - 17  Studies/Results: No results found.   Tye Savoy, NP-C @  08/30/2018, 9:01 AM

## 2018-08-30 NOTE — H&P (Signed)
History and Physical    AH BOTT PYP:[id number] DOB: 18-Nov-1940 DOA: 08/30/2018  PCP: Haywood Pao, MD   Patient coming from: Home    Chief Complaint: Bright red blood per rectum  HPI: [name] is a 78 y.o. male with medical history significant of colonic polyp, hypertension, hyperlipidemia, coronary artery disease who presents the emergency department with complaint of bright red blood per rectum.  He felt  a bit uncomfortable this morning and went to the bathroom at around 4 am and he noticed bright red blood per rectum in the commode.  Reported that he had multiple episodes at home and 2 episodes since he got here.He was feeling dizzy when he came here.  Patient was also found to be orthostatic on presentation but his hemoglobin is stable.  Patient denies abdomen pain or shortness of breath. Patient was discharged from here on 06/21/2018 when he was admitted for hematochezia status post colonoscopy by Dr. Henrene Pastor.  At that time he was discharged home after colonoscopy but developed persistent rectal bleeding so he presented back to the hospital.  Colonoscopy at that time found nonobstructing mass in the left colon and was biopsied.  Biopsy did not show malignancy.  He has been planned by Dr. [name] for resection of the colonic mass in July 7. Patient called [facility] this morning and he was called to the emergency department. Patient seen and examined the bedside in the emergency department.  Currently his hemoglobin stable.  Hemodynamically stable.  He denies any abdominal pain, nausea, vomiting, fever, chills, shortness of breath, dysuria. COVID-19 screen test negative.   ED Course: Hemoglobin stable.  Found to be orthostatic.  Started on IV fluids.  GI consulted.  Review of Systems: As per HPI otherwise 10 point review of systems negative.    Past Medical History:  Diagnosis Date  . Acute cholecystitis 02/14/2012  . Anginal pain ([place])   . CAD (coronary artery  disease) 2007   occlusion of mid LCX, 50-70% LAD  . Cataract 2010   bilateral eyes  . Choledocholithiasis 02/14/2012  . Dyslipidemia   . HTN (hypertension)   . Macular degeneration   . Myocardial infarction ([place]) 1/07   Acute non-ST-elevation myocardial infarction  . Overweight(278.02)    with body mass index of 29    Past Surgical History:  Procedure Laterality Date  . CARDIAC CATHETERIZATION  03/17/2005   Ejection fraction is estimated at 55%  . CATARACT EXTRACTION W/ INTRAOCULAR LENS  IMPLANT, BILATERAL  ~ 2010  . CHOLECYSTECTOMY  02/15/2012   Procedure: LAPAROSCOPIC CHOLECYSTECTOMY WITH INTRAOPERATIVE CHOLANGIOGRAM;  Surgeon: Stark Klein, MD;  Location: [place];  Service: General;  Laterality: N/A;  . KNEE SURGERY  1980's   "took knee out and put it back in" ; left      reports that he has never smoked. He has never used smokeless tobacco. He reports that he does not drink alcohol or use drugs.  Allergies  Allergen Reactions  . Unasyn [Ampicillin-Sulbactam Sodium] Rash    Family History  Problem Relation Age of Onset  . Heart disease Father   . Cancer Mother        breast  . Stomach cancer Mother   . Cancer Maternal Aunt        breast  . Esophageal cancer Neg Hx   . Rectal cancer Neg Hx   . Colon cancer Neg Hx      Prior to Admission medications   Medication Sig Start Date  End Date Taking? Authorizing Provider  clopidogrel (PLAVIX) 75 MG tablet Take 1 tablet (75 mg total) by mouth daily. Start 06/24/2018 06/21/18   Pokhrel, Corrie Mckusick, MD  metoprolol succinate (TOPROL-XL) 50 MG 24 hr tablet TAKE 1 TABLET EVERY DAY 02/11/18   Martinique, Peter M, MD  Multiple Vitamin (MULTIVITAMIN) tablet Take 1 tablet by mouth daily.    [provider]  Multiple Vitamins-Minerals (MULTIVITAMIN ADULT) TABS Take 1 tablet by mouth daily.    [provider]  ramipril (ALTACE) 10 MG capsule Take 1 capsule (10 mg total) by mouth daily. 05/08/18   Martinique, Peter M, MD  simvastatin  (ZOCOR) 80 MG tablet TAKE 1 TABLET EVERY DAY  AT  6PM Patient taking differently: Take 80 mg by mouth daily.  02/11/18   Martinique, Peter M, MD    Physical Exam: Vitals:   08/30/18 0604 08/30/18 0630 08/30/18 0700 08/30/18 0800  BP: 121/90 125/64 135/75 133/74  Pulse: 60 (!) 56 61 62  Resp: 15 16 18  (!) 21  Temp:      TempSrc:      SpO2: 100% 97% 99% 97%    Constitutional: NAD, calm, comfortable Vitals:   08/30/18 0604 08/30/18 0630 08/30/18 0700 08/30/18 0800  BP: 121/90 125/64 135/75 133/74  Pulse: 60 (!) 56 61 62  Resp: 15 16 18  (!) 21  Temp:      TempSrc:      SpO2: 100% 97% 99% 97%   Eyes: PERRL, lids and conjunctivae normal ENMT: Mucous membranes are moist. Posterior pharynx clear of any exudate or lesions.Normal dentition.  Neck: normal, supple, no masses, no thyromegaly Respiratory: clear to auscultation bilaterally, no wheezing, no crackles. Normal respiratory effort. No accessory muscle use.  Cardiovascular: Regular rate and rhythm, no murmurs / rubs / gallops. No extremity edema. 2+ pedal pulses. No carotid bruits.  Abdomen: no tenderness, no masses palpated. No hepatosplenomegaly. Bowel sounds positive.  Musculoskeletal: no clubbing / cyanosis. No joint deformity upper and lower extremities. Good ROM, no contractures. Normal muscle tone.  Skin: no rashes, lesions, ulcers. No induration Neurologic: CN 2-12 grossly intact. Sensation intact, DTR normal. Strength 5/5 in all 4.  Psychiatric: Normal judgment and insight. Alert and oriented x 3. Normal mood.   Foley Catheter:None  Labs on Admission: I have personally reviewed following labs and imaging studies  CBC: Recent Labs  Lab 08/30/18 0627  WBC 10.3  HGB 14.6  HCT 44.7  MCV 96.5  PLT 748   Basic Metabolic Panel: Recent Labs  Lab 08/30/18 0627  NA 143  K 3.7  CL 110  CO2 23  GLUCOSE 146*  BUN 15  CREATININE 1.08  CALCIUM 8.7*   GFR: CrCl cannot be calculated (Unknown ideal weight.). Liver  Function Tests: Recent Labs  Lab 08/30/18 0627  AST 15  ALT 15  ALKPHOS 63  BILITOT 1.0  PROT 6.6  ALBUMIN 3.9   No results for input(s): LIPASE, AMYLASE in the last 168 hours. No results for input(s): AMMONIA in the last 168 hours. Coagulation Profile: No results for input(s): INR, PROTIME in the last 168 hours. Cardiac Enzymes: No results for input(s): CKTOTAL, CKMB, CKMBINDEX, TROPONINI in the last 168 hours. BNP (last 3 results) No results for input(s): PROBNP in the last 8760 hours. HbA1C: No results for input(s): HGBA1C in the last 72 hours. CBG: No results for input(s): GLUCAP in the last 168 hours. Lipid Profile: No results for input(s): CHOL, HDL, LDLCALC, TRIG, CHOLHDL, LDLDIRECT in the last 72 hours.  Thyroid Function Tests: No results for input(s): TSH, T4TOTAL, FREET4, T3FREE, THYROIDAB in the last 72 hours. Anemia Panel: No results for input(s): VITAMINB12, FOLATE, FERRITIN, TIBC, IRON, RETICCTPCT in the last 72 hours. Urine analysis:    Component Value Date/Time   COLORURINE YELLOW 06/20/2018 1354   APPEARANCEUR CLEAR 06/20/2018 1354   LABSPEC 1.019 06/20/2018 1354   PHURINE 5.0 06/20/2018 1354   GLUCOSEU NEGATIVE 06/20/2018 1354   HGBUR SMALL (A) 06/20/2018 1354   BILIRUBINUR NEGATIVE 06/20/2018 1354   KETONESUR 80 (A) 06/20/2018 1354   PROTEINUR NEGATIVE 06/20/2018 1354   UROBILINOGEN >8.0 (H) 02/18/2012 1006   NITRITE NEGATIVE 06/20/2018 1354   LEUKOCYTESUR NEGATIVE 06/20/2018 1354    Radiological Exams on Admission: No results found.   Assessment/Plan Principal Problem:   GI bleed Active Problems:   Dyslipidemia   Essential hypertension   CAD (coronary artery disease)   Tubular adenoma   Hematochezia  Hematochezia: Several episodes since last 24 hours.  Currently hemoglobin stable.  Continue to monitor H&H every 12 hours for now.  Was on Plavix at home.  GI consulted already. Hematochezia might be associated with left chronic mass. Might  need colonoscopy.  We will keep him on clear liquid diet for now.  Orthostatic hypotension: Most likely secondary to GI bleed.  Hemoglobin stable. BP stable on supine. Continue gentle IV fluids.  Monitor orthostatic vitals.  Descending colon mass: Status post biopsy which showed serrated adenoma, no high-grade dysplasia or malignancy.  He has been planned for resection of this mass by Dr. [name] on July 7.   History of coronary disease/mini strokes: Currently stable.  Was previously on aspirin but changed to Plavix after he developed mini strokes few years ago. Plavix held for now.  Plan to restart as per GI.  Hyperlipidemia: On statin  Hypertension: On antihypertensives at home.  Will hold this home antihypertensives due to orthostatic hypotension..  Severity of Illness: The appropriate patient status for this patient is OBSERVATION.     DVT prophylaxis: SCD Code Status: Full Family Communication: None present at the bedside Consults called: GI     Shelly Coss MD [facility] Pager [phone]  If 7PM-7AM, please contact night-coverage [email or web address] Password [facility]  08/30/2018, 8:23 AM

## 2018-08-30 NOTE — Care Management Obs Status (Signed)
[place] NOTIFICATION   Patient Details  Name: [name] MRN: [id number] Date of Birth: 09/13/1940   Medicare Observation Status Notification Given:  Yes    Dessa Phi, RN 08/30/2018, 12:59 PM

## 2018-08-30 NOTE — ED Provider Notes (Signed)
[name] DEPT Provider Note   CSN: [id number] Arrival date & time: 08/30/18  3154    History   Chief Complaint Chief Complaint  Patient presents with  . Rectal Bleeding    HPI [name] is a 78 y.o. male.     78 yo M with a chief complaint of bright red blood per rectum.  Patient notices that this morning he felt a bit uneasy and got up to walk around and felt like he had use the bathroom and realized that he had nothing but red bright red blood come out.  Had multiple episodes at home and has had 2 episodes since he got here.  He denies abdominal pain with this no shortness of breath denies weakness denies feeling like he may pass out.  Patient is on Plavix.  Had an episode like this previously that was after a colonoscopy with biopsy.  He has a known rectal mass that is benign and that there is a scheduled surgery in the beginning of July to remove.  The history is provided by the patient.  Rectal Bleeding Quality:  Bright red Amount:  Copious Duration:  2 hours Timing:  Constant Chronicity:  Recurrent Similar prior episodes: yes   Relieved by:  Nothing Worsened by:  Nothing Ineffective treatments:  None tried Associated symptoms: no abdominal pain, no fever and no vomiting     Past Medical History:  Diagnosis Date  . Acute cholecystitis 02/14/2012  . Anginal pain ([place])   . CAD (coronary artery disease) 2007   occlusion of mid LCX, 50-70% LAD  . Cataract 2010   bilateral eyes  . Choledocholithiasis 02/14/2012  . Dyslipidemia   . HTN (hypertension)   . Macular degeneration   . Myocardial infarction ([place]) 1/07   Acute non-ST-elevation myocardial infarction  . Overweight(278.02)    with body mass index of 29    Patient Active Problem List   Diagnosis Date Noted  . GI bleed 08/30/2018  . Tubular adenoma 08/30/2018  . Hematochezia 08/30/2018  . Rectal bleed 06/20/2018  . Macular degeneration   . Anginal pain ([place])   .  Altered mental status 02/16/2012  . Choledocholithiasis 02/14/2012  . Acute cholecystitis 02/14/2012  . Dehydration 12/02/2011  . Delirium 12/02/2011  . Fever 12/02/2011    Class: Acute  . Weakness generalized 12/02/2011    Class: Acute  . Myocardial infarction ([place])   . Dyslipidemia   . Essential hypertension   . CAD (coronary artery disease)     Past Surgical History:  Procedure Laterality Date  . CARDIAC CATHETERIZATION  03/17/2005   Ejection fraction is estimated at 55%  . CATARACT EXTRACTION W/ INTRAOCULAR LENS  IMPLANT, BILATERAL  ~ 2010  . CHOLECYSTECTOMY  02/15/2012   Procedure: LAPAROSCOPIC CHOLECYSTECTOMY WITH INTRAOPERATIVE CHOLANGIOGRAM;  Surgeon: Stark Klein, MD;  Location: [facility];  Service: General;  Laterality: N/A;  . KNEE SURGERY  1980's   "took knee out and put it back in" ; left         Home Medications    Prior to Admission medications   Medication Sig Start Date End Date Taking? Authorizing Provider  clopidogrel (PLAVIX) 75 MG tablet Take 1 tablet (75 mg total) by mouth daily. Start 06/24/2018 06/21/18  Yes Pokhrel, Laxman, MD  metoprolol succinate (TOPROL-XL) 50 MG 24 hr tablet TAKE 1 TABLET EVERY DAY Patient taking differently: Take 50 mg by mouth daily.  02/11/18  Yes Martinique, Peter M, MD  Multiple Vitamin (MULTIVITAMIN)  tablet Take 1 tablet by mouth daily.   Yes [provider]  ramipril (ALTACE) 10 MG capsule Take 1 capsule (10 mg total) by mouth daily. 05/08/18  Yes Martinique, Peter M, MD  simvastatin (ZOCOR) 80 MG tablet TAKE 1 TABLET EVERY DAY  AT  6PM Patient taking differently: Take 80 mg by mouth daily.  02/11/18  Yes Martinique, Peter M, MD    Family History Family History  Problem Relation Age of Onset  . Heart disease Father   . Cancer Mother        breast  . Stomach cancer Mother   . Cancer Maternal Aunt        breast  . Esophageal cancer Neg Hx   . Rectal cancer Neg Hx   . Colon cancer Neg Hx     Social History Social History    Tobacco Use  . Smoking status: Never Smoker  . Smokeless tobacco: Never Used  Substance Use Topics  . Alcohol use: No    Comment: 12/05/2011 "stopped all alcohol > 25 years ago; never drank heavily"  . Drug use: No     Allergies   Unasyn [ampicillin-sulbactam sodium]   Review of Systems Review of Systems  Constitutional: Negative for chills and fever.  HENT: Negative for congestion and facial swelling.   Eyes: Negative for discharge and visual disturbance.  Respiratory: Negative for shortness of breath.   Cardiovascular: Negative for chest pain and palpitations.  Gastrointestinal: Positive for blood in stool and hematochezia. Negative for abdominal pain, diarrhea and vomiting.  Musculoskeletal: Negative for arthralgias and myalgias.  Skin: Negative for color change and rash.  Neurological: Negative for tremors, syncope and headaches.  Psychiatric/Behavioral: Negative for confusion and dysphoric mood.     Physical Exam Updated Vital Signs BP (!) 151/79 (BP Location: Right Arm)   Pulse 71   Temp 97.9 F (36.6 C) (Oral)   Resp 18   Wt 95.7 kg   SpO2 96%   BMI 27.09 kg/m   Physical Exam Vitals signs and nursing note reviewed.  Constitutional:      Appearance: He is well-developed.  HENT:     Head: Normocephalic and atraumatic.  Eyes:     Pupils: Pupils are equal, round, and reactive to light.  Neck:     Musculoskeletal: Normal range of motion and neck supple.     Vascular: No JVD.  Cardiovascular:     Rate and Rhythm: Normal rate and regular rhythm.     Heart sounds: No murmur. No friction rub. No gallop.   Pulmonary:     Effort: No respiratory distress.     Breath sounds: No wheezing.  Abdominal:     General: There is no distension.     Tenderness: There is no abdominal tenderness. There is no guarding or rebound.  Musculoskeletal: Normal range of motion.  Skin:    Coloration: Skin is not pale.     Findings: No rash.  Neurological:     Mental Status: He  is alert and oriented to person, place, and time.  Psychiatric:        Behavior: Behavior normal.      ED Treatments / Results  Labs (all labs ordered are listed, but only abnormal results are displayed) Labs Reviewed  COMPREHENSIVE METABOLIC PANEL - Abnormal; Notable for the following components:      Result Value   Glucose, Bld 146 (*)    Calcium 8.7 (*)    All other components within normal limits  HEMOGLOBIN AND HEMATOCRIT, BLOOD - Abnormal; Notable for the following components:   Hemoglobin 10.6 (*)    HCT 33.0 (*)    All other components within normal limits  SARS CORONAVIRUS 2 (HOSPITAL ORDER, [place] [facility])  CBC  BASIC METABOLIC PANEL  HEMOGLOBIN AND HEMATOCRIT, BLOOD  POC OCCULT BLOOD, ED  TYPE AND SCREEN    EKG None  Radiology No results found.  Procedures Procedures (including critical care time)  Medications Ordered in ED Medications  atorvastatin (LIPITOR) tablet 40 mg (40 mg Oral Given 08/30/18 1756)  0.9 %  sodium chloride infusion ( Intravenous New Bag/Given 08/30/18 2024)  sodium chloride 0.9 % bolus 1,000 mL (0 mLs Intravenous Stopped 08/30/18 0900)     Initial Impression / Assessment and Plan / ED Course  I have reviewed the triage vital signs and the nursing notes.  Pertinent labs & imaging results that were available during my care of the patient were reviewed by me and considered in my medical decision making (see chart for details).        78 yo M with a chief complaints of bright red blood per rectum.  Patient had a bright red bloody bowel movement just prior to arrival to the room and has bright red blood on his bottom on my exam.  On Plavix.  History of the same.  Will obtain lab work discussed with [place] had likely admit.  Hgb Stable.  Admit.   The patients results and plan were reviewed and discussed.   Any x-rays performed were independently reviewed by myself.   Differential diagnosis were considered  with the presenting HPI.  Medications  atorvastatin (LIPITOR) tablet 40 mg (40 mg Oral Given 08/30/18 1756)  0.9 %  sodium chloride infusion ( Intravenous New Bag/Given 08/30/18 2024)  sodium chloride 0.9 % bolus 1,000 mL (0 mLs Intravenous Stopped 08/30/18 0900)    Vitals:   08/30/18 0855 08/30/18 0900 08/30/18 0940 08/30/18 2009  BP: (!) 161/87 (!) 158/88 (!) 154/92 (!) 151/79  Pulse: 84 80 77 71  Resp: 13 16 20 18   Temp:   98.2 F (36.8 C) 97.9 F (36.6 C)  TempSrc:   Oral Oral  SpO2: 98% 98% 100% 96%  Weight:   95.7 kg     Final diagnoses:  BRBPR (bright red blood per rectum)    Admission/ observation were discussed with the admitting physician, patient and/or family and they are comfortable with the plan.    Final Clinical Impressions(s) / ED Diagnoses   Final diagnoses:  BRBPR (bright red blood per rectum)    ED Discharge Orders    None       Deno Etienne, DO 08/30/18 2307

## 2018-08-30 NOTE — ED Notes (Signed)
ED TO INPATIENT HANDOFF REPORT  Name/Age/Gender [name] 78 y.o. male  Code Status    Code Status Orders  (From admission, onward)         Start     Ordered   08/30/18 0823  Full code  Continuous     08/30/18 0823        Code Status History    Date Active Date Inactive Code Status Order ID Comments User Context   06/20/2018 1710 06/21/2018 1925 Full Code [id number]  Hosie Poisson, MD Inpatient   02/14/2012 1305 02/18/2012 1556 Full Code [id number]  Mancel Parsons, RN Inpatient   12/02/2011 0741 12/07/2011 1553 Full Code [id number]  Leanna Battles, MD Inpatient   Advance Care Planning Activity      Home/SNF/Other Home  Chief Complaint rectal bleeding  Level of Care/Admitting Diagnosis ED Disposition    ED Disposition Condition [facility]: [facility] [[id number]]  Level of Care: Telemetry [5]  Admit to tele based on following criteria: Complex arrhythmia (Bradycardia/Tachycardia)  Covid Evaluation: Screening Protocol (No Symptoms)  Diagnosis: Hematochezia [505397]  Admitting Physician: Shelly Coss [[id number]]  Attending Physician: Shelly Coss [[id number]]  PT Class (Do Not Modify): Observation [104]  PT Acc Code (Do Not Modify): Observation [10022]       Medical History Past Medical History:  Diagnosis Date  . Acute cholecystitis 02/14/2012  . Anginal pain ([place])   . CAD (coronary artery disease) 2007   occlusion of mid LCX, 50-70% LAD  . Cataract 2010   bilateral eyes  . Choledocholithiasis 02/14/2012  . Dyslipidemia   . HTN (hypertension)   . Macular degeneration   . Myocardial infarction ([place]) 1/07   Acute non-ST-elevation myocardial infarction  . Overweight(278.02)    with body mass index of 29    Allergies Allergies  Allergen Reactions  . Unasyn [Ampicillin-Sulbactam Sodium] Rash    IV Location/Drains/Wounds Patient Lines/Drains/Airways Status   Active Line/Drains/Airways    Name:   Placement  date:   Placement time:   Site:   Days:   Peripheral IV 08/30/18 Right Wrist   08/30/18    0628    Wrist   less than 1   Peripheral IV 08/30/18 Left Arm   08/30/18    0629    Arm   less than 1   Incision 02/15/12 Abdomen Other (Comment)   02/15/12    1237     2388   Incision - 4 Ports Abdomen 1: Umbilicus 2: Mid;Upper 3: Right;Medial 4: Right;Lateral   02/15/12    -     2388          Labs/Imaging Results for orders placed or performed during the hospital encounter of 08/30/18 (from the past 48 hour(s))  Comprehensive metabolic panel     Status: Abnormal   Collection Time: 08/30/18  6:27 AM  Result Value Ref Range   Sodium 143 135 - 145 mmol/L   Potassium 3.7 3.5 - 5.1 mmol/L   Chloride 110 98 - 111 mmol/L   CO2 23 22 - 32 mmol/L   Glucose, Bld 146 (H) 70 - 99 mg/dL   BUN 15 8 - 23 mg/dL   Creatinine, Ser 1.08 0.61 - 1.24 mg/dL   Calcium 8.7 (L) 8.9 - 10.3 mg/dL   Total Protein 6.6 6.5 - 8.1 g/dL   Albumin 3.9 3.5 - 5.0 g/dL   AST 15 15 - 41 U/L   ALT 15 0 - 44 U/L  Alkaline Phosphatase 63 38 - 126 U/L   Total Bilirubin 1.0 0.3 - 1.2 mg/dL   GFR calc non Af Amer >60 >60 mL/min   GFR calc Af Amer >60 >60 mL/min   Anion gap 10 5 - 15    Comment: Performed at [facility], [place] [address] [place], [place] [address]  CBC     Status: None   Collection Time: 08/30/18  6:27 AM  Result Value Ref Range   WBC 10.3 4.0 - 10.5 K/uL   RBC 4.63 4.22 - 5.81 MIL/uL   Hemoglobin 14.6 13.0 - 17.0 g/dL   HCT 44.7 39.0 - 52.0 %   MCV 96.5 80.0 - 100.0 fL   MCH 31.5 26.0 - 34.0 pg   MCHC 32.7 30.0 - 36.0 g/dL   RDW 13.0 11.5 - 15.5 %   Platelets 280 150 - 400 K/uL   nRBC 0.0 0.0 - 0.2 %    Comment: Performed at [facility], [place] [address], [place], [place] [address]  Type and screen     Status: None   Collection Time: 08/30/18  6:27 AM  Result Value Ref Range   ABO/RH(D) A POS    Antibody Screen POS    Sample Expiration 09/02/2018,2359     Antibody Identification      ANTI M Performed at [facility] [address], [place], [place] [address]   SARS Coronavirus 2 (CEPHEID - Performed in [place] hospital lab), Hosp Order     Status: None   Collection Time: 08/30/18  6:34 AM   Specimen: Nasopharyngeal Swab  Result Value Ref Range   SARS Coronavirus 2 NEGATIVE NEGATIVE    Comment: (NOTE) If result is NEGATIVE SARS-CoV-2 target nucleic acids are NOT DETECTED. The SARS-CoV-2 RNA is generally detectable in upper and lower  respiratory specimens during the acute phase of infection. The lowest  concentration of SARS-CoV-2 viral copies this assay can detect is 250  copies / mL. A negative result does not preclude SARS-CoV-2 infection  and should not be used as the sole basis for treatment or other  patient management decisions.  A negative result may occur with  improper specimen collection / handling, submission of specimen other  than nasopharyngeal swab, presence of viral mutation(s) within the  areas targeted by this assay, and inadequate number of viral copies  (<250 copies / mL). A negative result must be combined with clinical  observations, patient history, and epidemiological information. If result is POSITIVE SARS-CoV-2 target nucleic acids are DETECTED. The SARS-CoV-2 RNA is generally detectable in upper and lower  respiratory specimens dur ing the acute phase of infection.  Positive  results are indicative of active infection with SARS-CoV-2.  Clinical  correlation with patient history and other diagnostic information is  necessary to determine patient infection status.  Positive results do  not rule out bacterial infection or co-infection with other viruses. If result is PRESUMPTIVE POSTIVE SARS-CoV-2 nucleic acids MAY BE PRESENT.   A presumptive positive result was obtained on the submitted specimen  and confirmed on repeat testing.  While 2019 novel coronavirus  (SARS-CoV-2) nucleic  acids may be present in the submitted sample  additional confirmatory testing may be necessary for epidemiological  and / or clinical management purposes  to differentiate between  SARS-CoV-2 and other Sarbecovirus currently known to infect humans.  If clinically indicated additional testing with an alternate test  methodology [phone]) is advised. The SARS-CoV-2 RNA is generally  detectable in upper and lower respiratory sp ecimens during the acute  phase of infection. The expected result is Negative. Fact Sheet for Patients:  StrictlyIdeas.no Fact Sheet for Healthcare Providers: BankingDealers.co.za This test is not yet approved or cleared by the [place] FDA and has been authorized for detection and/or diagnosis of SARS-CoV-2 by FDA under an Emergency Use Authorization (EUA).  This EUA will remain in effect (meaning this test can be used) for the duration of the COVID-19 declaration under Section 564(b)(1) of the Act, 21 U.S.C. section 360bbb-3(b)(1), unless the authorization is terminated or revoked sooner. Performed at [facility], [place] [address], [place], [place] [address]    No results found.  Pending Labs Unresulted Labs (From admission, onward)    Start     Ordered   08/31/18 1443  Basic metabolic panel  Tomorrow morning,   R     08/30/18 0823   08/30/18 1800  Hemoglobin and hematocrit, blood  Now then every 12 hours,   R (with STAT occurrences)     08/30/18 0833          Vitals/Pain Today's Vitals   08/30/18 0630 08/30/18 0700 08/30/18 0800 08/30/18 0855  BP: 125/64 135/75 133/74 (!) 161/87  Pulse: (!) 56 61 62 84  Resp: 16 18 (!) 21 13  Temp:      TempSrc:      SpO2: 97% 99% 97% 98%  PainSc:        Isolation Precautions No active isolations  Medications Medications  atorvastatin (LIPITOR) tablet 40 mg (has no administration in time range)  0.9 %  sodium chloride infusion (has no  administration in time range)  sodium chloride 0.9 % bolus 1,000 mL (1,000 mLs Intravenous New Bag/Given 08/30/18 0735)    Mobility walks with person assist

## 2018-08-30 NOTE — ED Notes (Signed)
GI at bedside

## 2018-08-30 NOTE — ED Notes (Signed)
EDP Tyrone Nine made aware of orthostatic vital signs

## 2018-08-30 NOTE — ED Notes (Signed)
hospitalist at bedside

## 2018-08-30 NOTE — ED Notes (Signed)
Pt given BSC, non-slip yellow socks and has call bell within reach. Pt has been told to call staff and not to get out of bed without assistance. Pt says "sometimes I have to go too fast and can't wait for help". Pt again asked to please call staff and not get out of bed without assistance.

## 2018-08-30 NOTE — ED Triage Notes (Signed)
Patient here from home with complaints of rectal bleeding that started this morning. Hx of same. Reports he is having surgery in July.

## 2018-08-30 NOTE — ED Notes (Signed)
Provider witnessed clots no poc hemi clot

## 2018-08-30 NOTE — ED Provider Notes (Signed)
Pt received at sign out after workup completed for T/C to consult/admit.  0730: T/C returned from [facility] Dr. Carlean Purl, case discussed, including:  HPI, pertinent PM/SHx, VS/PE, dx testing, ED course and treatment:  H/H stable, but given orthostasis as well as pt's PMHx, GI requests to admit to [facility] service and GI will consult. 0800:  T/C returned from [facility] Dr. Tawanna Solo, case discussed, including:  HPI, pertinent PM/SHx, VS/PE, dx testing, ED course and treatment, as well as d/w GI MD:  Agreeable to admit.    Francine Graven, DO 08/30/18 [phone]

## 2018-08-30 NOTE — ED Notes (Signed)
EDP Tyrone Nine at bedside

## 2018-08-30 NOTE — ED Notes (Signed)
Pt has had x3 bowel movements since arriving to ED, per pt.

## 2018-08-31 ENCOUNTER — Observation Stay (HOSPITAL_COMMUNITY): Payer: Medicare Other

## 2018-08-31 DIAGNOSIS — Z79899 Other long term (current) drug therapy: Secondary | ICD-10-CM | POA: Diagnosis not present

## 2018-08-31 DIAGNOSIS — Z8673 Personal history of transient ischemic attack (TIA), and cerebral infarction without residual deficits: Secondary | ICD-10-CM | POA: Diagnosis not present

## 2018-08-31 DIAGNOSIS — I2510 Atherosclerotic heart disease of native coronary artery without angina pectoris: Secondary | ICD-10-CM | POA: Diagnosis present

## 2018-08-31 DIAGNOSIS — Z9841 Cataract extraction status, right eye: Secondary | ICD-10-CM | POA: Diagnosis not present

## 2018-08-31 DIAGNOSIS — D62 Acute posthemorrhagic anemia: Secondary | ICD-10-CM | POA: Diagnosis present

## 2018-08-31 DIAGNOSIS — K922 Gastrointestinal hemorrhage, unspecified: Secondary | ICD-10-CM | POA: Diagnosis not present

## 2018-08-31 DIAGNOSIS — K5731 Diverticulosis of large intestine without perforation or abscess with bleeding: Secondary | ICD-10-CM | POA: Diagnosis present

## 2018-08-31 DIAGNOSIS — Z9049 Acquired absence of other specified parts of digestive tract: Secondary | ICD-10-CM | POA: Diagnosis not present

## 2018-08-31 DIAGNOSIS — K921 Melena: Secondary | ICD-10-CM | POA: Diagnosis present

## 2018-08-31 DIAGNOSIS — Z7902 Long term (current) use of antithrombotics/antiplatelets: Secondary | ICD-10-CM | POA: Diagnosis not present

## 2018-08-31 DIAGNOSIS — Z961 Presence of intraocular lens: Secondary | ICD-10-CM | POA: Diagnosis present

## 2018-08-31 DIAGNOSIS — H3530 Unspecified macular degeneration: Secondary | ICD-10-CM | POA: Diagnosis present

## 2018-08-31 DIAGNOSIS — Z881 Allergy status to other antibiotic agents status: Secondary | ICD-10-CM | POA: Diagnosis not present

## 2018-08-31 DIAGNOSIS — I951 Orthostatic hypotension: Secondary | ICD-10-CM | POA: Diagnosis present

## 2018-08-31 DIAGNOSIS — D124 Benign neoplasm of descending colon: Secondary | ICD-10-CM | POA: Diagnosis present

## 2018-08-31 DIAGNOSIS — I252 Old myocardial infarction: Secondary | ICD-10-CM | POA: Diagnosis not present

## 2018-08-31 DIAGNOSIS — Z9842 Cataract extraction status, left eye: Secondary | ICD-10-CM | POA: Diagnosis not present

## 2018-08-31 DIAGNOSIS — Z8719 Personal history of other diseases of the digestive system: Secondary | ICD-10-CM | POA: Diagnosis not present

## 2018-08-31 DIAGNOSIS — E785 Hyperlipidemia, unspecified: Secondary | ICD-10-CM | POA: Diagnosis present

## 2018-08-31 DIAGNOSIS — Z800 Family history of malignant neoplasm of digestive organs: Secondary | ICD-10-CM | POA: Diagnosis not present

## 2018-08-31 DIAGNOSIS — Z8249 Family history of ischemic heart disease and other diseases of the circulatory system: Secondary | ICD-10-CM | POA: Diagnosis not present

## 2018-08-31 DIAGNOSIS — I10 Essential (primary) hypertension: Secondary | ICD-10-CM | POA: Diagnosis present

## 2018-08-31 DIAGNOSIS — Z1159 Encounter for screening for other viral diseases: Secondary | ICD-10-CM | POA: Diagnosis not present

## 2018-08-31 LAB — HEMOGLOBIN AND HEMATOCRIT, BLOOD
HCT: 30 % — ABNORMAL LOW (ref 39.0–52.0)
HCT: 32 % — ABNORMAL LOW (ref 39.0–52.0)
Hemoglobin: 9.6 g/dL — ABNORMAL LOW (ref 13.0–17.0)
Hemoglobin: 10.3 g/dL — ABNORMAL LOW (ref 13.0–17.0)

## 2018-08-31 LAB — BASIC METABOLIC PANEL
Anion gap: 4 — ABNORMAL LOW (ref 5–15)
BUN: 12 mg/dL (ref 8–23)
CO2: 25 mmol/L (ref 22–32)
Calcium: 7.8 mg/dL — ABNORMAL LOW (ref 8.9–10.3)
Chloride: 114 mmol/L — ABNORMAL HIGH (ref 98–111)
Creatinine, Ser: 0.86 mg/dL (ref 0.61–1.24)
GFR calc Af Amer: >60 mL/min (ref >60)
GFR calc non Af Amer: >60 mL/min (ref >60)
Glucose, Bld: 114 mg/dL — ABNORMAL HIGH (ref 70–99)
Potassium: 4 mmol/L (ref 3.5–5.1)
Sodium: 143 mmol/L (ref 135–145)

## 2018-08-31 MED ORDER — SODIUM PERTECHNETATE TC 99M INJECTION
22.0000 | Freq: Once | INTRAVENOUS | Status: AC | PRN
  Administered 2018-08-31: 22 via INTRAVENOUS

## 2018-08-31 NOTE — Progress Notes (Addendum)
     [facility] Progress Note  CC:  Hematochezia   Subjective: He reports feeling well. No dizziness today. No N/V or abdominal pain. He passed a small BM with blood clots 3:30pm 6/19 and a formed BM with darker red clots at 9:30am, described as less than his prior episodes. He is tolerating a clear liquid diet  Objective:  Vital signs in last 24 hours: Temp:  [97.8 F (36.6 C)-97.9 F (36.6 C)] 97.8 F (36.6 C) (06/20 0546) Pulse Rate:  [71-84] 84 (06/20 0546) Resp:  [18] 18 (06/20 0546) BP: (134-151)/(79-85) 134/85 (06/20 0546) SpO2:  [96 %-99 %] 99 % (06/20 0546) Last BM Date: 08/30/18 General:   Alert,  Well-developed, in NAD sitting at the bedside Heart: RRR, no murmur. Pulm: Lungs clear throughout.  Abdomen: Soft, nondistended, nontender, + BS x 4 quads, no hepatomegaly. Extremities:  Without edema. Neurologic:  Alert and  oriented x4;  grossly normal neurologically. Psych:  Alert and cooperative. Normal mood and affect.  Intake/Output from previous day: 06/19 0701 - 06/20 0700 In: 2020.6 [I.V.:2020.6] Out: -  Intake/Output this shift: Total I/O In: 480 [P.O.:480] Out: -   Lab Results: Recent Labs    08/30/18 0627 08/30/18 1811 08/31/18 0521  WBC 10.3  --   --   HGB 14.6 10.6* 9.6*  HCT 44.7 33.0* 30.0*  PLT 280  --   --    BMET Recent Labs    08/30/18 0627 08/31/18 0521  NA 143 143  K 3.7 4.0  CL 110 114*  CO2 23 25  GLUCOSE 146* 114*  BUN 15 12  CREATININE 1.08 0.86  CALCIUM 8.7* 7.8*   LFT Recent Labs    08/30/18 0627  PROT 6.6  ALBUMIN 3.9  AST 15  ALT 15  ALKPHOS 63  BILITOT 1.0    Assessment / Plan:  1. 78 y.o. male with a hx of TIA on Plavix with painless hematochezia. Admission Hg 14.6 << Hg 10.6. Today 9.6. BUN 12. ? Dilutional component with IVF 100cc/hr.  No dizziness today. Hemodynamically stable. He passed a BM with darker red blood clots at 9:30am today.  -repeat H/H 4pm today -consider tagged red blood  cell scan, await recommendations from Dr. Fuller Plan. -continue to hold Plavix  2. Large serrated adenomatous polyp to the descending colon scheduled for resection with Dr. [name] 09/17/2018    LOS: 0 days   Noralyn Pick NP 08/31/2018, 12:36 PM     Attending Physician Note   I have taken an interval history, reviewed the chart and examined the patient. I agree with the Advanced Practitioner's note, impression and recommendations.   Persistent hematochezia and ABL anemia Tagged RBC scan today Trend CBC Hold Plavix  Lucio Edward, MD FACG [phone]

## 2018-08-31 NOTE — Progress Notes (Signed)
Patient with no bloody stools or complaints overnight. Will continue to monitor patient.

## 2018-08-31 NOTE — Progress Notes (Signed)
NM on-call technician notified at 1330 of ordered GI test. Dose has been ordered and NM states test will be performed around 1500. Will continue to monitor closely.

## 2018-08-31 NOTE — Progress Notes (Signed)
PROGRESS NOTE    [name]  XTK:[id number] DOB: 07-05-1940 DOA: 08/30/2018 PCP: Haywood Pao, MD   Brief Narrative:  [name] is a 78 y.o. male with medical history significant of colonic polyp, hypertension, hyperlipidemia, coronary artery disease who presents the emergency department with complaint of bright red blood per rectum.  He felt  a bit uncomfortable this morning and went to the bathroom at around 4 am and he noticed bright red blood per rectum in the commode.  Reported that he had multiple episodes at home and 2 episodes since he got here.He was feeling dizzy when he came here.  Patient was also found to be orthostatic on presentation but his hemoglobin is stable.  Patient denies abdomen pain or shortness of breath. Patient was discharged from here on 06/21/2018 when he was admitted for hematochezia status post colonoscopy by Dr. Henrene Pastor.  At that time he was discharged home after colonoscopy but developed persistent rectal bleeding so he presented back to the hospital.  Colonoscopy at that time found nonobstructing mass in the left colon and was biopsied.  Biopsy did not show malignancy.  He has been planned by Dr. [name] for resection of the colonic mass in July 7. Patient called [facility] this morning and he was called to the emergency department. Patient seen and examined the bedside in the emergency department.  Currently his hemoglobin stable.  Hemodynamically stable.  He denies any abdominal pain, nausea, vomiting, fever, chills, shortness of breath, dysuria. COVID-19 screen test negative.  Assessment & Plan:   Principal Problem:   GI bleed Active Problems:   Dyslipidemia   Essential hypertension   CAD (coronary artery disease)   Tubular adenoma   Hematochezia   Hematochezia: Several episodes since last 24 hours.  Currently hemoglobin stable.  Continue to monitor H&H every 12 hours for now.   Was on Plavix at home, now on hold   GI consulted already.  Hematochezia might be associated with left chronic mass. Appreciate GI recs -> plan for tagged RBC scan today  Orthostatic hypotension: Most likely secondary to GI bleed.  Hemoglobin stable. BP stable on supine. Continue gentle IV fluids.  Monitor orthostatic vitals.  Descending colon mass: Status post biopsy which showed serrated adenoma, no high-grade dysplasia or malignancy.  He has been planned for resection of this mass by Dr. [name] on July 7.   History of coronary disease/mini strokes: Currently stable.  Was previously on aspirin but changed to Plavix after he developed mini strokes few years ago. Plavix held for now.  Plan to restart as per GI.  Hyperlipidemia: On statin  Hypertension: On antihypertensives at home.  Will hold this home antihypertensives due to orthostatic hypotension..   DVT prophylaxis: SCD Code Status: full code Family Communication: none at bedside Disposition Plan: pending - requires continued inpatient treatment with drop in Hb and continued need for further GI eval   Consultants:   GI  Procedures:  none  Antimicrobials:  Anti-infectives (From admission, onward)   None         Subjective: Had episode of bleeding this morning, but thinks it's slowed overall  Objective: Vitals:   08/30/18 0940 08/30/18 2009 08/31/18 0546 08/31/18 1323  BP: (!) 154/92 (!) 151/79 134/85 133/80  Pulse: 77 71 84 (!) 103  Resp: 20 18 18 18   Temp: 98.2 F (36.8 C) 97.9 F (36.6 C) 97.8 F (36.6 C) 98.2 F (36.8 C)  TempSrc: Oral Oral Oral Oral  SpO2: 100%  96% 99% 100%  Weight: 95.7 kg     Height: 6\' 2"  (1.88 m)       Intake/Output Summary (Last 24 hours) at 08/31/2018 1801 Last data filed at 08/31/2018 1324 Gross per 24 hour  Intake 2438.95 ml  Output -  Net 2438.95 ml   Filed Weights   08/30/18 0940  Weight: 95.7 kg    Examination:  General exam: Appears calm and comfortable  Respiratory system: Clear to auscultation. Respiratory effort  normal. Cardiovascular system: S1 & S2 heard, RRR.  Gastrointestinal system: Abdomen is nondistended, soft and nontender Central nervous system: Alert and oriented. No focal neurological deficits. Extremities: Symmetric 5 x 5 power. Skin: No rashes, lesions or ulcers Psychiatry: Judgement and insight appear normal. Mood & affect appropriate.     Data Reviewed: I have personally reviewed following labs and imaging studies  CBC: Recent Labs  Lab 08/30/18 0627 08/30/18 1811 08/31/18 0521  WBC 10.3  --   --   HGB 14.6 10.6* 9.6*  HCT 44.7 33.0* 30.0*  MCV 96.5  --   --   PLT 280  --   --    Basic Metabolic Panel: Recent Labs  Lab 08/30/18 0627 08/31/18 0521  NA 143 143  K 3.7 4.0  CL 110 114*  CO2 23 25  GLUCOSE 146* 114*  BUN 15 12  CREATININE 1.08 0.86  CALCIUM 8.7* 7.8*   GFR: Estimated Creatinine Clearance: 81 mL/min (by C-G formula based on SCr of 0.86 mg/dL). Liver Function Tests: Recent Labs  Lab 08/30/18 0627  AST 15  ALT 15  ALKPHOS 63  BILITOT 1.0  PROT 6.6  ALBUMIN 3.9   No results for input(s): LIPASE, AMYLASE in the last 168 hours. No results for input(s): AMMONIA in the last 168 hours. Coagulation Profile: No results for input(s): INR, PROTIME in the last 168 hours. Cardiac Enzymes: No results for input(s): CKTOTAL, CKMB, CKMBINDEX, TROPONINI in the last 168 hours. BNP (last 3 results) No results for input(s): PROBNP in the last 8760 hours. HbA1C: No results for input(s): HGBA1C in the last 72 hours. CBG: No results for input(s): GLUCAP in the last 168 hours. Lipid Profile: No results for input(s): CHOL, HDL, LDLCALC, TRIG, CHOLHDL, LDLDIRECT in the last 72 hours. Thyroid Function Tests: No results for input(s): TSH, T4TOTAL, FREET4, T3FREE, THYROIDAB in the last 72 hours. Anemia Panel: No results for input(s): VITAMINB12, FOLATE, FERRITIN, TIBC, IRON, RETICCTPCT in the last 72 hours. Sepsis Labs: No results for input(s): PROCALCITON,  LATICACIDVEN in the last 168 hours.  Recent Results (from the past 240 hour(s))  SARS Coronavirus 2 (CEPHEID - Performed in [facility] hospital lab), Hosp Order     Status: None   Collection Time: 08/30/18  6:34 AM   Specimen: Nasopharyngeal Swab  Result Value Ref Range Status   SARS Coronavirus 2 NEGATIVE NEGATIVE Final    Comment: (NOTE) If result is NEGATIVE SARS-CoV-2 target nucleic acids are NOT DETECTED. The SARS-CoV-2 RNA is generally detectable in upper and lower  respiratory specimens during the acute phase of infection. The lowest  concentration of SARS-CoV-2 viral copies this assay can detect is 250  copies / mL. A negative result does not preclude SARS-CoV-2 infection  and should not be used as the sole basis for treatment or other  patient management decisions.  A negative result may occur with  improper specimen collection / handling, submission of specimen other  than nasopharyngeal swab, presence of viral mutation(s) within the  areas  targeted by this assay, and inadequate number of viral copies  (<250 copies / mL). A negative result must be combined with clinical  observations, patient history, and epidemiological information. If result is POSITIVE SARS-CoV-2 target nucleic acids are DETECTED. The SARS-CoV-2 RNA is generally detectable in upper and lower  respiratory specimens dur ing the acute phase of infection.  Positive  results are indicative of active infection with SARS-CoV-2.  Clinical  correlation with patient history and other diagnostic information is  necessary to determine patient infection status.  Positive results do  not rule out bacterial infection or co-infection with other viruses. If result is PRESUMPTIVE POSTIVE SARS-CoV-2 nucleic acids MAY BE PRESENT.   A presumptive positive result was obtained on the submitted specimen  and confirmed on repeat testing.  While 2019 novel coronavirus  (SARS-CoV-2) nucleic acids may be present in the submitted  sample  additional confirmatory testing may be necessary for epidemiological  and / or clinical management purposes  to differentiate between  SARS-CoV-2 and other Sarbecovirus currently known to infect humans.  If clinically indicated additional testing with an alternate test  methodology [phone]) is advised. The SARS-CoV-2 RNA is generally  detectable in upper and lower respiratory sp ecimens during the acute  phase of infection. The expected result is Negative. Fact Sheet for Patients:  StrictlyIdeas.no Fact Sheet for Healthcare Providers: BankingDealers.co.za This test is not yet approved or cleared by the [place] FDA and has been authorized for detection and/or diagnosis of SARS-CoV-2 by FDA under an Emergency Use Authorization (EUA).  This EUA will remain in effect (meaning this test can be used) for the duration of the COVID-19 declaration under Section 564(b)(1) of the Act, 21 U.S.C. section 360bbb-3(b)(1), unless the authorization is terminated or revoked sooner. Performed at [facility], [place] [address], [place], [place] [address]          Radiology Studies: No results found.      Scheduled Meds: . atorvastatin  40 mg Oral q1800   Continuous Infusions: . sodium chloride 100 mL/hr at 08/31/18 0623     LOS: 0 days    Time spent: over 30 min    Fayrene Helper, MD [facility] Pager AMION  If 7PM-7AM, please contact night-coverage [email or web address] Password [email or web address] 08/31/2018, 6:01 PM

## 2018-09-01 LAB — COMPREHENSIVE METABOLIC PANEL
ALT: 12 U/L (ref 0–44)
AST: 23 U/L (ref 15–41)
Albumin: 2.9 g/dL — ABNORMAL LOW (ref 3.5–5.0)
Alkaline Phosphatase: 44 U/L (ref 38–126)
Anion gap: 4 — ABNORMAL LOW (ref 5–15)
BUN: 9 mg/dL (ref 8–23)
CO2: 25 mmol/L (ref 22–32)
Calcium: 7.9 mg/dL — ABNORMAL LOW (ref 8.9–10.3)
Chloride: 113 mmol/L — ABNORMAL HIGH (ref 98–111)
Creatinine, Ser: 0.89 mg/dL (ref 0.61–1.24)
GFR calc Af Amer: >60 mL/min (ref >60)
GFR calc non Af Amer: >60 mL/min (ref >60)
Glucose, Bld: 104 mg/dL — ABNORMAL HIGH (ref 70–99)
Potassium: 4.3 mmol/L (ref 3.5–5.1)
Sodium: 142 mmol/L (ref 135–145)
Total Bilirubin: 1.3 mg/dL — ABNORMAL HIGH (ref 0.3–1.2)
Total Protein: 4.8 g/dL — ABNORMAL LOW (ref 6.5–8.1)

## 2018-09-01 LAB — MAGNESIUM: Magnesium: 2 mg/dL (ref 1.7–2.4)

## 2018-09-01 LAB — HEMOGLOBIN AND HEMATOCRIT, BLOOD
HCT: 27.4 % — ABNORMAL LOW (ref 39.0–52.0)
HCT: 27.7 % — ABNORMAL LOW (ref 39.0–52.0)
Hemoglobin: 8.6 g/dL — ABNORMAL LOW (ref 13.0–17.0)
Hemoglobin: 9.1 g/dL — ABNORMAL LOW (ref 13.0–17.0)

## 2018-09-01 NOTE — Progress Notes (Signed)
PROGRESS NOTE    [name]  WUJ:[id number] DOB: [birth year] DOA: 08/30/2018 PCP: Haywood Pao, MD   Brief Narrative:  [name] is [name] 78 y.o. male with medical history significant of colonic polyp, hypertension, hyperlipidemia, coronary artery disease who presents the emergency department with complaint of bright red blood per rectum.  He felt  [name] bit uncomfortable this morning and went to the bathroom at around 4 am and he noticed bright red blood per rectum in the commode.  Reported that he had multiple episodes at home and 2 episodes since he got here.He was feeling dizzy when he came here.  Patient was also found to be orthostatic on presentation but his hemoglobin is stable.  Patient denies abdomen pain or shortness of breath. Patient was discharged from here on 06/21/2018 when he was admitted for hematochezia status post colonoscopy by Dr. Henrene Pastor.  At that time he was discharged home after colonoscopy but developed persistent rectal bleeding so he presented back to the hospital.  Colonoscopy at that time found nonobstructing mass in the left colon and was biopsied.  Biopsy did not show malignancy.  He has been planned by Dr. [name] for resection of the colonic mass in July 7. Patient called [facility] this morning and he was called to the emergency department. Patient seen and examined the bedside in the emergency department.  Currently his hemoglobin stable.  Hemodynamically stable.  He denies any abdominal pain, nausea, vomiting, fever, chills, shortness of breath, dysuria. COVID-19 screen test negative.  Assessment & Plan:   Principal Problem:   GI bleed Active Problems:   Dyslipidemia   Essential hypertension   CAD (coronary artery disease)   Tubular adenoma   Hematochezia   Hematochezia: Less recently.  Currently hemoglobin stable.  Continue to monitor H&H every 12 hours for now.   Was on Plavix at home, now on hold   GI consulted already. Hematochezia might be  associated with left chronic mass. Appreciate GI recs -> plan for tagged RBC scan today -> negative Appreciate GI recs  Orthostatic hypotension: Most likely secondary to GI bleed.  Hemoglobin stable. BP stable on supine. Continue gentle IV fluids.  Monitor orthostatic vitals. He's been up and about without LH or dizziness.  Descending colon mass: Status post biopsy which showed serrated adenoma, no high-grade dysplasia or malignancy.  He has been planned for resection of this mass by Dr. [name] on July 7.   History of coronary disease/mini strokes: Currently stable.  Was previously on aspirin but changed to Plavix after he developed mini strokes few years ago. Plavix held for now.  Plan to restart as per GI.  Hyperlipidemia: On statin  Hypertension: On antihypertensives at home.  Will hold this home antihypertensives due to orthostatic hypotension..   DVT prophylaxis: SCD Code Status: full code Family Communication: none at bedside Disposition Plan: pending - requires continued inpatient treatment with drop in Hb and continued need for further GI eval   Consultants:   GI  Procedures:  none  Antimicrobials:  Anti-infectives (From admission, onward)   None         Subjective: Feels better.  Objective: Vitals:   08/31/18 1323 08/31/18 2200 09/01/18 0512 09/01/18 1415  BP: 133/80 (!) 146/92 (!) 152/77 (!) 149/85  Pulse: (!) 103 94 86 80  Resp: 18 18 18 18   Temp: 98.2 F (36.8 C) (!) 97.5 F (36.4 C) 98.4 F (36.9 C) 98.4 F (36.9 C)  TempSrc: Oral Oral Oral Oral  SpO2: 100% 96% 97% 98%  Weight:      Height:        Intake/Output Summary (Last 24 hours) at 09/01/2018 1657 Last data filed at 09/01/2018 1416 Gross per 24 hour  Intake 960 ml  Output -  Net 960 ml   Filed Weights   08/30/18 0940  Weight: 95.7 kg    Examination:  General: No acute distress. Cardiovascular: Heart sounds show [name] regular rate, and rhythm Lungs: Clear to auscultation  bilaterally Abdomen: Soft, nontender, nondistended  Neurological: Alert and oriented 3. Moves all extremities 4. Cranial nerves II through XII grossly intact. Skin: Warm and dry. No rashes or lesions. Extremities: No clubbing or cyanosis. No edema.  Psychiatric: Mood and affect are normal. Insight and judgment are appropriate.     Data Reviewed: I have personally reviewed following labs and imaging studies  CBC: Recent Labs  Lab 08/30/18 0627 08/30/18 1811 08/31/18 0521 08/31/18 1941 09/01/18 0500  WBC 10.3  --   --   --   --   HGB 14.6 10.6* 9.6* 10.3* 8.6*  HCT 44.7 33.0* 30.0* 32.0* 27.4*  MCV 96.5  --   --   --   --   PLT 280  --   --   --   --    Basic Metabolic Panel: Recent Labs  Lab 08/30/18 0627 08/31/18 0521 09/01/18 0500  NA 143 143 142  K 3.7 4.0 4.3  CL 110 114* 113*  CO2 23 25 25   GLUCOSE 146* 114* 104*  BUN 15 12 9   CREATININE 1.08 0.86 0.89  CALCIUM 8.7* 7.8* 7.9*  MG  --   --  2.0   GFR: Estimated Creatinine Clearance: 78.2 mL/min (by C-G formula based on SCr of 0.89 mg/dL). Liver Function Tests: Recent Labs  Lab 08/30/18 0627 09/01/18 0500  AST 15 23  ALT 15 12  ALKPHOS 63 44  BILITOT 1.0 1.3*  PROT 6.6 4.8*  ALBUMIN 3.9 2.9*   No results for input(s): LIPASE, AMYLASE in the last 168 hours. No results for input(s): AMMONIA in the last 168 hours. Coagulation Profile: No results for input(s): INR, PROTIME in the last 168 hours. Cardiac Enzymes: No results for input(s): CKTOTAL, CKMB, CKMBINDEX, TROPONINI in the last 168 hours. BNP (last 3 results) No results for input(s): PROBNP in the last 8760 hours. HbA1C: No results for input(s): HGBA1C in the last 72 hours. CBG: No results for input(s): GLUCAP in the last 168 hours. Lipid Profile: No results for input(s): CHOL, HDL, LDLCALC, TRIG, CHOLHDL, LDLDIRECT in the last 72 hours. Thyroid Function Tests: No results for input(s): TSH, T4TOTAL, FREET4, T3FREE, THYROIDAB in the last 72  hours. Anemia Panel: No results for input(s): VITAMINB12, FOLATE, FERRITIN, TIBC, IRON, RETICCTPCT in the last 72 hours. Sepsis Labs: No results for input(s): PROCALCITON, LATICACIDVEN in the last 168 hours.  Recent Results (from the past 240 hour(s))  SARS Coronavirus 2 (CEPHEID - Performed in [facility] hospital lab), Hosp Order     Status: None   Collection Time: 08/30/18  6:34 AM   Specimen: Nasopharyngeal Swab  Result Value Ref Range Status   SARS Coronavirus 2 NEGATIVE NEGATIVE Final    Comment: (NOTE) If result is NEGATIVE SARS-CoV-2 target nucleic acids are NOT DETECTED. The SARS-CoV-2 RNA is generally detectable in upper and lower  respiratory specimens during the acute phase of infection. The lowest  concentration of SARS-CoV-2 viral copies this assay can detect is 250  copies / mL. Lorrain Rivers  negative result does not preclude SARS-CoV-2 infection  and should not be used as the sole basis for treatment or other  patient management decisions.  Alejandro Gamel negative result may occur with  improper specimen collection / handling, submission of specimen other  than nasopharyngeal swab, presence of viral mutation(s) within the  areas targeted by this assay, and inadequate number of viral copies  (<250 copies / mL). Maniya Donovan negative result must be combined with clinical  observations, patient history, and epidemiological information. If result is POSITIVE SARS-CoV-2 target nucleic acids are DETECTED. The SARS-CoV-2 RNA is generally detectable in upper and lower  respiratory specimens dur ing the acute phase of infection.  Positive  results are indicative of active infection with SARS-CoV-2.  Clinical  correlation with patient history and other diagnostic information is  necessary to determine patient infection status.  Positive results do  not rule out bacterial infection or co-infection with other viruses. If result is PRESUMPTIVE POSTIVE SARS-CoV-2 nucleic acids MAY BE PRESENT.   Karo Rog presumptive  positive result was obtained on the submitted specimen  and confirmed on repeat testing.  While 2019 novel coronavirus  (SARS-CoV-2) nucleic acids may be present in the submitted sample  additional confirmatory testing may be necessary for epidemiological  and / or clinical management purposes  to differentiate between  SARS-CoV-2 and other Sarbecovirus currently known to infect humans.  If clinically indicated additional testing with an alternate test  methodology [phone]) is advised. The SARS-CoV-2 RNA is generally  detectable in upper and lower respiratory sp ecimens during the acute  phase of infection. The expected result is Negative. Fact Sheet for Patients:  StrictlyIdeas.no Fact Sheet for Healthcare Providers: BankingDealers.co.za This test is not yet approved or cleared by the [place] FDA and has been authorized for detection and/or diagnosis of SARS-CoV-2 by FDA under an Emergency Use Authorization (EUA).  This EUA will remain in effect (meaning this test can be used) for the duration of the COVID-19 declaration under Section 564(b)(1) of the Act, 21 U.S.C. section 360bbb-3(b)(1), unless the authorization is terminated or revoked sooner. Performed at [facility], [place] [address], [place], [place] [address]          Radiology Studies: Nm Gi Blood Loss  Result Date: 08/31/2018 CLINICAL DATA:  GI bleeding. EXAM: NUCLEAR MEDICINE GASTROINTESTINAL BLEEDING SCAN TECHNIQUE: Sequential abdominal images were obtained following intravenous administration of Tc-77m labeled red blood cells. RADIOPHARMACEUTICALS:  Twenty-two mCi Tc-18m pertechnetate in-vitro labeled red cells. COMPARISON:  None. FINDINGS: No abnormal radiotracer activity is identified. Physiologic activity within bilateral kidneys and urinary bladder. IMPRESSION: No active GI bleeding identified. Electronically Signed   By: Fidela Salisbury M.D.    On: 08/31/2018 19:09        Scheduled Meds: . atorvastatin  40 mg Oral q1800   Continuous Infusions: . sodium chloride 100 mL/hr at 09/01/18 0517     LOS: 1 day    Time spent: over 30 min    Fayrene Helper, MD [facility] Pager AMION  If 7PM-7AM, please contact night-coverage [email or web address] Password [facility] 09/01/2018, 4:57 PM

## 2018-09-01 NOTE — Progress Notes (Addendum)
[facility] Progress Note  CC:  Hematochezia   Subjective: He reports feeling well, he would like to go home today. Last BM was last night around 9:30pm, no blood or blood clots seen. Discussed tagged RBC scan was negative.    Objective:  Vital signs in last 24 hours: Temp:  [97.5 F (36.4 C)-98.4 F (36.9 C)] 98.4 F (36.9 C) (06/21 0512) Pulse Rate:  [86-103] 86 (06/21 0512) Resp:  [18] 18 (06/21 0512) BP: (133-152)/(77-92) 152/77 (06/21 0512) SpO2:  [96 %-100 %] 97 % (06/21 0512) Last BM Date: 08/31/18 General:   Alert,  Well-developed, in NAD Heart: RRR, no murmurs. Pulm: Lungs clear throughout. Abdomen: Soft, nontender, no masses or organomegaly. Extremities:  Without edema. Neurologic:  Alert and  oriented x4;  grossly normal neurologically. Psych:  Alert and cooperative. Normal mood and affect.  Intake/Output from previous day: 06/20 0701 - 06/21 0700 In: 1080 [P.O.:1080] Out: -  Intake/Output this shift: No intake/output data recorded.  Lab Results: Recent Labs    08/30/18 0627  08/31/18 0521 08/31/18 1941 09/01/18 0500  WBC 10.3  --   --   --   --   HGB 14.6   < > 9.6* 10.3* 8.6*  HCT 44.7   < > 30.0* 32.0* 27.4*  PLT 280  --   --   --   --    < > = values in this interval not displayed.   BMET Recent Labs    08/30/18 0627 08/31/18 0521 09/01/18 0500  NA 143 143 142  K 3.7 4.0 4.3  CL 110 114* 113*  CO2 23 25 25   GLUCOSE 146* 114* 104*  BUN 15 12 9   CREATININE 1.08 0.86 0.89  CALCIUM 8.7* 7.8* 7.9*   LFT Recent Labs    09/01/18 0500  PROT 4.8*  ALBUMIN 2.9*  AST 23  ALT 12  ALKPHOS 44  BILITOT 1.3*   PT/INR No results for input(s): LABPROT, INR in the last 72 hours. Hepatitis Panel No results for input(s): HEPBSAG, HCVAB, HEPAIGM, HEPBIGM in the last 72 hours.  Nm Gi Blood Loss  Result Date: 08/31/2018 CLINICAL DATA:  GI bleeding. EXAM: NUCLEAR MEDICINE GASTROINTESTINAL BLEEDING SCAN TECHNIQUE: Sequential  abdominal images were obtained following intravenous administration of Tc-20m labeled red blood cells. RADIOPHARMACEUTICALS:  Twenty-two mCi Tc-39m pertechnetate in-vitro labeled red cells. COMPARISON:  None. FINDINGS: No abnormal radiotracer activity is identified. Physiologic activity within bilateral kidneys and urinary bladder. IMPRESSION: No active GI bleeding identified. Electronically Signed   By: Fidela Salisbury M.D.   On: 08/31/2018 19:09    Assessment / Plan:  1 . 78 y.o. male with a hx of TIA on Plavix with painless hematochezia. Admission Hg 14.6. Hg continues to drift downward 10.6 << 8.6 today. BUN 9. T. bili 1.3.Tagged red blood cell scan 6/20 did not show evidence of active bleeding. Last BM last night 9:30pm, no blood or blood clots. No BM  Today. -possible discharge home today, hold Plavix for the next 24 to 48 hours, further recommendations regarding discharge and Plavix restart instructions per Dr. Fuller Plan. -recommend repeat CBC Tuesday 6/23  2. Large serrated adenomatous polyp to the descending colon scheduled for resection with Dr. [name] 09/17/2018  3. Hx TIA on Plavix     LOS: 1 day   Noralyn Pick NP 09/01/2018, 7:16 AM      Attending Physician Note   I have taken an interval history, reviewed the chart and examined the patient.  I agree with the Advanced Practitioner's note, impression and recommendations.   Painless hematochezia seems to be slowing. Nuclear tagged scan was negative. Continue in hospital care and continue to hold Plavix. Advance to full liquids. Trend CBC. Dr. Lyndel Safe is covering [facility] hospital service starting on Monday.   Lucio Edward, MD FACG [phone]

## 2018-09-02 DIAGNOSIS — K922 Gastrointestinal hemorrhage, unspecified: Secondary | ICD-10-CM

## 2018-09-02 LAB — COMPREHENSIVE METABOLIC PANEL
ALT: 12 U/L (ref 0–44)
AST: 16 U/L (ref 15–41)
Albumin: 3 g/dL — ABNORMAL LOW (ref 3.5–5.0)
Alkaline Phosphatase: 49 U/L (ref 38–126)
Anion gap: 7 (ref 5–15)
BUN: 7 mg/dL — ABNORMAL LOW (ref 8–23)
CO2: 22 mmol/L (ref 22–32)
Calcium: 7.9 mg/dL — ABNORMAL LOW (ref 8.9–10.3)
Chloride: 114 mmol/L — ABNORMAL HIGH (ref 98–111)
Creatinine, Ser: 0.86 mg/dL (ref 0.61–1.24)
GFR calc Af Amer: >60 mL/min (ref >60)
GFR calc non Af Amer: >60 mL/min (ref >60)
Glucose, Bld: 104 mg/dL — ABNORMAL HIGH (ref 70–99)
Potassium: 3.8 mmol/L (ref 3.5–5.1)
Sodium: 143 mmol/L (ref 135–145)
Total Bilirubin: 0.9 mg/dL (ref 0.3–1.2)
Total Protein: 4.8 g/dL — ABNORMAL LOW (ref 6.5–8.1)

## 2018-09-02 LAB — HEMOGLOBIN AND HEMATOCRIT, BLOOD
HCT: 26.6 % — ABNORMAL LOW (ref 39.0–52.0)
Hemoglobin: 8.4 g/dL — ABNORMAL LOW (ref 13.0–17.0)

## 2018-09-02 MED ORDER — ASPIRIN EC 81 MG PO TBEC: 81.0000 mg | DELAYED_RELEASE_TABLET | Freq: Every day | ORAL | 0 refills | Status: AC

## 2018-09-02 NOTE — Progress Notes (Signed)
Patient with no BM overnight on Sunday. Will continue to monitor patient.

## 2018-09-02 NOTE — Progress Notes (Signed)
Patient discharging home.  IV removed - WNL.  Reviewed AVS and mediations, follow up appointments, and when to call MD if needed for worsening symptoms.  Patient verbalizes understanding.  No questions.  Patient in NAD at this time, awaiting arrival of family for pick up.

## 2018-09-02 NOTE — Progress Notes (Addendum)
Patient ID: [name], male   DOB: [birth year], 78 y.o.   MRN: [id number]    Progress Note   Subjective   Day #4 Feels fine, no c/o - hoping to go home No BM's since yesterday  Which was old clots per pt hgb 8.4 stable overall( down from 14.6 on admit) Bleeding scan negative 6/20   Objective   Vital signs in last 24 hours: Temp:  [97.6 F (36.4 C)-98.4 F (36.9 C)] 97.6 F (36.4 C) (06/22 0509) Pulse Rate:  [80-91] 85 (06/22 0509) Resp:  [16-18] 18 (06/22 0509) BP: (124-149)/(63-87) 124/63 (06/22 0509) SpO2:  [96 %-98 %] 96 % (06/22 0509) Last BM Date: 09/01/18 General:    Elderly WM in NAD Heart:  Regular rate and rhythm; no murmurs Lungs: Respirations even and unlabored, lungs CTA bilaterally Abdomen:  Soft, nontender and nondistended. Normal bowel sounds. Extremities:  Without edema. Neurologic:  Alert and oriented,  grossly normal neurologically. Psych:  Cooperative. Normal mood and affect.  Intake/Output from previous day: 06/21 0701 - 06/22 0700 In: 5176.9 [P.O.:720; I.V.:4456.9] Out: -  Intake/Output this shift: No intake/output data recorded.  Lab Results: Recent Labs    09/01/18 0500 09/01/18 1626 09/02/18 0449  HGB 8.6* 9.1* 8.4*  HCT 27.4* 27.7* 26.6*   BMET Recent Labs    08/31/18 0521 09/01/18 0500 09/02/18 0449  NA 143 142 143  K 4.0 4.3 3.8  CL 114* 113* 114*  CO2 25 25 22   GLUCOSE 114* 104* 104*  BUN 12 9 7*  CREATININE 0.86 0.89 0.86  CALCIUM 7.8* 7.9* 7.9*   LFT Recent Labs    09/02/18 0449  PROT 4.8*  ALBUMIN 3.0*  AST 16  ALT 12  ALKPHOS 49  BILITOT 0.9   PT/INR No results for input(s): LABPROT, INR in the last 72 hours.  Studies/Results: Nm Gi Blood Loss  Result Date: 08/31/2018 CLINICAL DATA:  GI bleeding. EXAM: NUCLEAR MEDICINE GASTROINTESTINAL BLEEDING SCAN TECHNIQUE: Sequential abdominal images were obtained following intravenous administration of Tc-93m labeled red blood cells. RADIOPHARMACEUTICALS:   Twenty-two mCi Tc-24m pertechnetate in-vitro labeled red cells. COMPARISON:  None. FINDINGS: No abnormal radiotracer activity is identified. Physiologic activity within bilateral kidneys and urinary bladder. IMPRESSION: No active GI bleeding identified. Electronically Signed   By: Fidela Salisbury M.D.   On: 08/31/2018 19:09       Assessment / Plan:     #64  78 year old white male admitted with acute lower GI bleed setting of chronic Plavix.  Patient has a known l mass in the descending colon with central umbilication found at colonoscopy April 2020.  Biopsies consistent with a sessile serrated polyp and patient had been referred to surgery for resection.  Patient also with pandiverticular disease.  Etiology of current bleed is not clear, they have been diverticular versus secondary to known large sessile serrated polyp- repeat colonoscopy not done, nuc med bleeding scan was negative.  Patient has not had any active bleeding over the past 24 hours.  #2 chronic antiplatelet therapy-on Plavix for TIA #3 anemia acute secondary to GI blood loss  Plan; patient is okay for discharge from GI standpoint. Discussed with Dr. Lyndel Safe and will plan to leave patient off Plavix and have him start a baby aspirin.  He is scheduled for colon resection on July 7 with Dr. [name].  We will communicate with Dr. [name] so  he is aware of this admission.  Query whether subtotal colectomy would be indicated if this were indeed a diverticular  bleed.  Patient advised to return to the hospital should he have any evidence of recurrent bleeding.  We will plan to check follow-up CBC through our office lab next Monday, 09/09/2018.          Principal Problem:   GI bleed Active Problems:   Dyslipidemia   Essential hypertension   CAD (coronary artery disease)   Tubular adenoma   Hematochezia     LOS: 2 days   Amy Esterwood  09/02/2018, 12:24 PM     Attending physician's note   I have reviewed the  chart. I agree with the Advanced Practitioner's note, impression and recommendations.  Patient has been discharged.  No further bleeding.  Patient has a mass in the descending colon and is awaiting left hemicolectomy by Dr. [name].  He had lower GI bleeding d/t ? Mass vs diverticular bleed.  Dr. [name] is aware.  Plan to hold Plavix due to lower GI bleeding until surgery.  Can start baby aspirin.   Carmell Austria, MD [facility] [phone].

## 2018-09-02 NOTE — Progress Notes (Signed)
Patient had 4 bloody BMs (maroon color with some clots) from 0700-1200.  From 1200-1900 patient had one small Bm maroon color.  Labs drawn 1800.

## 2018-09-02 NOTE — Discharge Summary (Signed)
Physician Discharge Summary  [name]:[id number] DOB: 06-Jul-1940 DOA: 08/30/2018  PCP: Timothy Pao, MD  Admit date: 08/30/2018 Discharge date: 09/02/2018  Time spent: 40 minutes  Recommendations for Outpatient Follow-up:  1. Follow outpatient CBC/CMP 2. Follow with gastroenterology outpatient for follow up labs 3. Follow up with surgery outpatient for L hemicolectomy 4. Stop plavix, started on baby aspirin, continue to follow for si/sx bleeding defer possible resumption of plavix to pcp  Discharge Diagnoses:  Principal Problem:   Skinner bleed Active Problems:   Dyslipidemia   Essential hypertension   CAD (coronary artery disease)   Tubular adenoma   Hematochezia   Discharge Condition: stable  Diet recommendation: heart healthy  Filed Weights   08/30/18 0940  Weight: 95.7 kg   History of present illness:  Timothy Mozley Chambersis [name] 78 y.o.malewith medical history significant ofcolonic polyp, hypertension, hyperlipidemia, coronary artery disease who presents the emergency department with complaint of bright red blood per rectum. He felta bit uncomfortable this morning and went to the bathroom at around 4 amand he noticed bright red blood per rectum in the commode. Reported that he had multiple episodes at home and 2 episodes since he got here.He was feeling dizzy when he came here.Patient was also found to be orthostatic on presentation but his hemoglobin is stable. Patient denies abdomen pain or shortness of breath. Patient was discharged from here on 06/21/2018 when he was admitted for hematochezia status post colonoscopy by Dr. Henrene Pastor. At that time he was discharged home after colonoscopy but developed persistent rectal bleeding so he presented back to the hospital. Colonoscopy at that time found nonobstructing mass in the left colon and was biopsied.Biopsy did not show malignancy. He has been planned by Dr. Lucia Skinner for resection of the colonic mass in July  7. [facility] called [facility] and he was called to the emergency department. Patient seen and examined the bedside in the emergency department. Currently his hemoglobin stable. Hemodynamically stable.He denies any abdominal pain, nausea, vomiting, fever, chills, shortness of breath, dysuria. COVID-19 screen testnegative.  He was seen for [name] Skinner bleed.  Skinner was c/s and recommended tagged RBC scan which was negative.  At the time of d/c his bleeding had slowed and he was deemed safe for outpatient follow up.   Hospital Course:  Hematochezia: improved. Currently hemoglobin stable.   Suspect 2/2 polyp vs diverticular bleeding Will stop plavix and start baby ASA per Skinner recs  Skinner consulted already -> recommending outpatient follow up, follow up with Dr. Lucia Skinner for surgery (Skinner will make him aware of this admission). Hematochezia mightbeassociated with left chronic mass vs diverticulosis Negative tagged rbc scan  Orthostatic hypotension: Most likely secondary to Skinner bleed. Resolved.  Descending colon mass: Status post biopsy whichshowed serrated adenoma, no high-grade dysplasia or malignancy.He has been planned for L hemicolectomy by Dr. Lucia Skinner on July 7.  History of coronary disease/mini strokes: Currently stable.Was previously on aspirin but changed to Plavix after he developed mini strokes few years ago.Plavix held for now, started on baby ASA.  Hyperlipidemia: On statin  Hypertension: resume home bp meds  Procedures:  none  Consultations:  Skinner  Discharge Exam: Vitals:   09/02/18 0307 09/02/18 0509  BP: (!) 147/87 124/63  Pulse: 91 85  Resp: 16 18  Temp: 97.7 F (36.5 C) 97.6 F (36.4 C)  SpO2: 98% 96%   Feels well. Ready for discharge. Discussed d/c plan   General: No acute distress. Cardiovascular: Heart sounds show [name]  regular rate, and rhythm.  Lungs: Clear to auscultation bilaterally  Abdomen: Soft, nontender, nondistended  Neurological:  Alert and oriented 3. Moves all extremities 4. Cranial nerves II through XII grossly intact. Skin: Warm and dry. No rashes or lesions. Extremities: No clubbing or cyanosis. No edema. Pedal pulses 2+.  Discharge Instructions   Discharge Instructions    Call MD for:  difficulty breathing, headache or visual disturbances   Complete by: As directed    Call MD for:  extreme fatigue   Complete by: As directed    Call MD for:  hives   Complete by: As directed    Call MD for:  persistant dizziness or light-headedness   Complete by: As directed    Call MD for:  persistant nausea and vomiting   Complete by: As directed    Call MD for:  redness, tenderness, or signs of infection (pain, swelling, redness, odor or green/yellow discharge around incision site)   Complete by: As directed    Call MD for:  severe uncontrolled pain   Complete by: As directed    Call MD for:  temperature >100.4   Complete by: As directed    Diet - low sodium heart healthy   Complete by: As directed    Discharge instructions   Complete by: As directed    You were seen for [name] gastrointestinal bleed.  This has improved.  Your workup has been unremarkable.  The bleeding could be from your polyp or diverticulosis.   Stop your plavix.  You can start [name] baby aspirin daily.    Please follow up with your PCP and surgery (Dr. Lucia Skinner).    Follow up with gastroenterology as scheduled (you'll need repeat blood counts within the next week with gastroenterology).  Return for new, recurrent, or worsening symptoms.  Please ask your PCP to request records from this hospitalization so they know what was done and what the next steps will be.   Increase activity slowly   Complete by: As directed      Allergies as of 09/02/2018      Reactions   Unasyn [ampicillin-sulbactam Sodium] Rash      Medication List    STOP taking these medications   clopidogrel 75 MG tablet Commonly known as: PLAVIX     TAKE these medications    aspirin EC 81 MG tablet Take 1 tablet (81 mg total) by mouth daily for 30 days.   metoprolol succinate 50 MG 24 hr tablet Commonly known as: TOPROL-XL TAKE 1 TABLET EVERY DAY   multivitamin tablet Take 1 tablet by mouth daily.   ramipril 10 MG capsule Commonly known as: ALTACE Take 1 capsule (10 mg total) by mouth daily.   simvastatin 80 MG tablet Commonly known as: ZOCOR TAKE 1 TABLET EVERY DAY  AT  6PM What changed: See the new instructions.      Allergies  Allergen Reactions  . Unasyn [Ampicillin-Sulbactam Sodium] Rash      The results of significant diagnostics from this hospitalization (including imaging, microbiology, ancillary and laboratory) are listed below for reference.    Significant Diagnostic Studies: Nm Skinner Blood Loss  Result Date: 08/31/2018 CLINICAL DATA:  Skinner bleeding. EXAM: NUCLEAR MEDICINE GASTROINTESTINAL BLEEDING SCAN TECHNIQUE: Sequential abdominal images were obtained following intravenous administration of Tc-32m labeled red blood cells. RADIOPHARMACEUTICALS:  Twenty-two mCi Tc-86m pertechnetate in-vitro labeled red cells. COMPARISON:  None. FINDINGS: No abnormal radiotracer activity is identified. Physiologic activity within bilateral kidneys and urinary bladder. IMPRESSION: No active Skinner  bleeding identified. Electronically Signed   By: Fidela Salisbury M.D.   On: 08/31/2018 19:09    Microbiology: Recent Results (from the past 240 hour(s))  SARS Coronavirus 2 (CEPHEID - Performed in [facility] hospital lab), Hosp Order     Status: None   Collection Time: 08/30/18  6:34 AM   Specimen: Nasopharyngeal Swab  Result Value Ref Range Status   SARS Coronavirus 2 NEGATIVE NEGATIVE Final    Comment: (NOTE) If result is NEGATIVE SARS-CoV-2 target nucleic acids are NOT DETECTED. The SARS-CoV-2 RNA is generally detectable in upper and lower  respiratory specimens during the acute phase of infection. The lowest  concentration of SARS-CoV-2 viral copies  this assay can detect is 250  copies / mL. Vincenzo Stave negative result does not preclude SARS-CoV-2 infection  and should not be used as the sole basis for treatment or other  patient management decisions.  Keny Donald negative result may occur with  improper specimen collection / handling, submission of specimen other  than nasopharyngeal swab, presence of viral mutation(s) within the  areas targeted by this assay, and inadequate number of viral copies  (<250 copies / mL). Zarie Kosiba negative result must be combined with clinical  observations, patient history, and epidemiological information. If result is POSITIVE SARS-CoV-2 target nucleic acids are DETECTED. The SARS-CoV-2 RNA is generally detectable in upper and lower  respiratory specimens dur ing the acute phase of infection.  Positive  results are indicative of active infection with SARS-CoV-2.  Clinical  correlation with patient history and other diagnostic information is  necessary to determine patient infection status.  Positive results do  not rule out bacterial infection or co-infection with other viruses. If result is PRESUMPTIVE POSTIVE SARS-CoV-2 nucleic acids MAY BE PRESENT.   Mance Vallejo presumptive positive result was obtained on the submitted specimen  and confirmed on repeat testing.  While 2019 novel coronavirus  (SARS-CoV-2) nucleic acids may be present in the submitted sample  additional confirmatory testing may be necessary for epidemiological  and / or clinical management purposes  to differentiate between  SARS-CoV-2 and other Sarbecovirus currently known to infect humans.  If clinically indicated additional testing with an alternate test  methodology [id number]) is advised. The SARS-CoV-2 RNA is generally  detectable in upper and lower respiratory sp ecimens during the acute  phase of infection. The expected result is Negative. Fact Sheet for Patients:  StrictlyIdeas.no Fact Sheet for Healthcare  Providers: BankingDealers.co.za This test is not yet approved or cleared by the [place] FDA and has been authorized for detection and/or diagnosis of SARS-CoV-2 by FDA under an Emergency Use Authorization (EUA).  This EUA will remain in effect (meaning this test can be used) for the duration of the COVID-19 declaration under Section 564(b)(1) of the Act, 21 U.S.C. section 360bbb-3(b)(1), unless the authorization is terminated or revoked sooner. Performed at [facility], [place] [address] [place], [place] [address]      Labs: Basic Metabolic Panel: Recent Labs  Lab 08/30/18 0627 08/31/18 0521 09/01/18 0500 09/02/18 0449  NA 143 143 142 143  K 3.7 4.0 4.3 3.8  CL 110 114* 113* 114*  CO2 23 25 25 22   GLUCOSE 146* 114* 104* 104*  BUN 15 12 9  7*  CREATININE 1.08 0.86 0.89 0.86  CALCIUM 8.7* 7.8* 7.9* 7.9*  MG  --   --  2.0  --    Liver Function Tests: Recent Labs  Lab 08/30/18 0627 09/01/18 0500 09/02/18 0449  AST 15 23 16  ALT 15 12 12   ALKPHOS 63 44 49  BILITOT 1.0 1.3* 0.9  PROT 6.6 4.8* 4.8*  ALBUMIN 3.9 2.9* 3.0*   No results for input(s): LIPASE, AMYLASE in the last 168 hours. No results for input(s): AMMONIA in the last 168 hours. CBC: Recent Labs  Lab 08/30/18 0627  08/31/18 0521 08/31/18 1941 09/01/18 0500 09/01/18 1626 09/02/18 0449  WBC 10.3  --   --   --   --   --   --   HGB 14.6   < > 9.6* 10.3* 8.6* 9.1* 8.4*  HCT 44.7   < > 30.0* 32.0* 27.4* 27.7* 26.6*  MCV 96.5  --   --   --   --   --   --   PLT 280  --   --   --   --   --   --    < > = values in this interval not displayed.   Cardiac Enzymes: No results for input(s): CKTOTAL, CKMB, CKMBINDEX, TROPONINI in the last 168 hours. BNP: BNP (last 3 results) No results for input(s): BNP in the last 8760 hours.  ProBNP (last 3 results) No results for input(s): PROBNP in the last 8760 hours.  CBG: No results for input(s): GLUCAP in the last 168  hours.     Signed:  Fayrene Helper MD.  [facility] 09/02/2018, 2:29 PM

## 2018-09-05 ENCOUNTER — Other Ambulatory Visit: Payer: Self-pay

## 2018-09-05 DIAGNOSIS — H353221 Exudative age-related macular degeneration, left eye, with active choroidal neovascularization: Secondary | ICD-10-CM | POA: Diagnosis not present

## 2018-09-05 DIAGNOSIS — H353212 Exudative age-related macular degeneration, right eye, with inactive choroidal neovascularization: Secondary | ICD-10-CM | POA: Diagnosis not present

## 2018-09-05 DIAGNOSIS — K5793 Diverticulitis of intestine, part unspecified, without perforation or abscess with bleeding: Secondary | ICD-10-CM

## 2018-09-05 DIAGNOSIS — H1851 Endothelial corneal dystrophy: Secondary | ICD-10-CM | POA: Diagnosis not present

## 2018-09-05 DIAGNOSIS — H43813 Vitreous degeneration, bilateral: Secondary | ICD-10-CM | POA: Diagnosis not present

## 2018-09-09 ENCOUNTER — Other Ambulatory Visit (INDEPENDENT_AMBULATORY_CARE_PROVIDER_SITE_OTHER): Payer: Medicare Other

## 2018-09-09 DIAGNOSIS — K5793 Diverticulitis of intestine, part unspecified, without perforation or abscess with bleeding: Secondary | ICD-10-CM

## 2018-09-09 LAB — CBC WITH DIFFERENTIAL/PLATELET
Basophils Absolute: 0.1 10*3/uL (ref 0.0–0.1)
Basophils Relative: 0.7 % (ref 0.0–3.0)
Eosinophils Absolute: 0.5 10*3/uL (ref 0.0–0.7)
Eosinophils Relative: 4.8 % (ref 0.0–5.0)
HCT: 29.8 % — ABNORMAL LOW (ref 39.0–52.0)
Hemoglobin: 10 g/dL — ABNORMAL LOW (ref 13.0–17.0)
Lymphocytes Relative: 21.5 % (ref 12.0–46.0)
Lymphs Abs: 2.3 10*3/uL (ref 0.7–4.0)
MCHC: 33.5 g/dL (ref 30.0–36.0)
MCV: 92.9 fl (ref 78.0–100.0)
Monocytes Absolute: 0.5 10*3/uL (ref 0.1–1.0)
Monocytes Relative: 4.6 % (ref 3.0–12.0)
Neutro Abs: 7.3 10*3/uL (ref 1.4–7.7)
Neutrophils Relative %: 68.4 % (ref 43.0–77.0)
Platelets: 331 10*3/uL (ref 150.0–400.0)
RBC: 3.21 Mil/uL — ABNORMAL LOW (ref 4.22–5.81)
RDW: 13.7 % (ref 11.5–15.5)
WBC: 10.6 10*3/uL — ABNORMAL HIGH (ref 4.0–10.5)

## 2018-09-11 NOTE — Patient Instructions (Addendum)
YOU NEED TO HAVE A COVID 19 TEST ON_____Friday , July 3rd_______, THIS TEST MUST BE DONE BEFORE SURGERY, COME TO [place] ENTRANCE. ONCE YOUR COVID TEST IS COMPLETED, PLEASE BEGIN THE QUARANTINE INSTRUCTIONS AS OUTLINED IN YOUR HANDOUT.                [name]     Your procedure is scheduled on: 09-17-2018  Report to [facility] Main  Entrance    Report to short stay at 530  AM     Call this number if you have problems the morning of surgery [phone]    Remember: Do not eat food or drink liquids :After Midnight. BRUSH YOUR TEETH MORNING OF SURGERY AND RINSE YOUR MOUTH OUT, NO CHEWING GUM CANDY OR MINTS.     Take these medicines the morning of surgery with A SIP OF WATER: METOPROLOL, SIMVASTATIN                                 You may not have any metal on your body including hair pins and              piercings  Do not wear jewelry, make-up, lotions, powders or perfumes, deodorant                           Men may shave face and neck.   Do not bring valuables to the hospital. [place].  Contacts, dentures or bridgework may not be worn into surgery.  Leave suitcase in the car. After surgery it may be brought to your room.      _____________________________________________________________________             [facility] - Preparing for Surgery Before surgery, you can play an important role.  Because skin is not sterile, your skin needs to be as free of germs as possible.  You can reduce the number of germs on your skin by washing with CHG (chlorahexidine gluconate) soap before surgery.  CHG is an antiseptic cleaner which kills germs and bonds with the skin to continue killing germs even after washing. Please DO NOT use if you have an allergy to CHG or antibacterial soaps.  If your skin becomes reddened/irritated stop using the CHG and inform your nurse when you arrive at Short  Stay. Do not shave (including legs and underarms) for at least 48 hours prior to the first CHG shower.  You may shave your face/neck. Please follow these instructions carefully:  1.  Shower with CHG Soap the night before surgery and the  morning of Surgery.  2.  If you choose to wash your hair, wash your hair first as usual with your  normal  shampoo.  3.  After you shampoo, rinse your hair and body thoroughly to remove the  shampoo.                           4.  Use CHG as you would any other liquid soap.  You can apply chg directly  to the skin and wash                       Gently with a scrungie or clean  washcloth.  5.  Apply the CHG Soap to your body ONLY FROM THE NECK DOWN.   Do not use on face/ open                           Wound or open sores. Avoid contact with eyes, ears mouth and genitals (private parts).                       Wash face,  Genitals (private parts) with your normal soap.             6.  Wash thoroughly, paying special attention to the area where your surgery  will be performed.  7.  Thoroughly rinse your body with warm water from the neck down.  8.  DO NOT shower/wash with your normal soap after using and rinsing off  the CHG Soap.                9.  Pat yourself dry with a clean towel.            10.  Wear clean pajamas.            11.  Place clean sheets on your bed the night of your first shower and do not  sleep with pets. Day of Surgery : Do not apply any lotions/deodorants the morning of surgery.  Please wear clean clothes to the hospital/surgery center.  FAILURE TO FOLLOW THESE INSTRUCTIONS MAY RESULT IN THE CANCELLATION OF YOUR SURGERY PATIENT SIGNATURE_________________________________  NURSE SIGNATURE__________________________________  ________________________________________________________________________   [name]  An incentive spirometer is a tool that can help keep your lungs clear and active. This tool measures how well you are  filling your lungs with each breath. Taking long deep breaths may help reverse or decrease the chance of developing breathing (pulmonary) problems (especially infection) following:  A long period of time when you are unable to move or be active. BEFORE THE PROCEDURE   If the spirometer includes an indicator to show your best effort, your nurse or respiratory therapist will set it to a desired goal.  If possible, sit up straight or lean slightly forward. Try not to slouch.  Hold the incentive spirometer in an upright position. INSTRUCTIONS FOR USE  1. Sit on the edge of your bed if possible, or sit up as far as you can in bed or on a chair. 2. Hold the incentive spirometer in an upright position. 3. Breathe out normally. 4. Place the mouthpiece in your mouth and seal your lips tightly around it. 5. Breathe in slowly and as deeply as possible, raising the piston or the ball toward the top of the column. 6. Hold your breath for 3-5 seconds or for as long as possible. Allow the piston or ball to fall to the bottom of the column. 7. Remove the mouthpiece from your mouth and breathe out normally. 8. Rest for a few seconds and repeat Steps 1 through 7 at least 10 times every 1-2 hours when you are awake. Take your time and take a few normal breaths between deep breaths. 9. The spirometer may include an indicator to show your best effort. Use the indicator as a goal to work toward during each repetition. 10. After each set of 10 deep breaths, practice coughing to be sure your lungs are clear. If you have an incision (the cut made at the time of surgery), support your incision when coughing by  placing a pillow or rolled up towels firmly against it. Once you are able to get out of bed, walk around indoors and cough well. You may stop using the incentive spirometer when instructed by your caregiver.  RISKS AND COMPLICATIONS  Take your time so you do not get dizzy or light-headed.  If you are in pain,  you may need to take or ask for pain medication before doing incentive spirometry. It is harder to take a deep breath if you are having pain. AFTER USE  Rest and breathe slowly and easily.  It can be helpful to keep track of a log of your progress. Your caregiver can provide you with a simple table to help with this. If you are using the spirometer at home, follow these instructions: [place] IF:   You are having difficultly using the spirometer.  You have trouble using the spirometer as often as instructed.  Your pain medication is not giving enough relief while using the spirometer.  You develop fever of 100.5 F (38.1 C) or higher. SEEK IMMEDIATE MEDICAL CARE IF:   You cough up bloody sputum that had not been present before.  You develop fever of 102 F (38.9 C) or greater.  You develop worsening pain at or near the incision site. MAKE SURE YOU:   Understand these instructions.  Will watch your condition.  Will get help right away if you are not doing well or get worse. Document Released: 07/10/2006 Document Revised: 05/22/2011 Document Reviewed: 09/10/2006 ExitCare Patient Information 2014 ExitCare, [place].   ________________________________________________________________________  WHAT IS A BLOOD TRANSFUSION? Blood Transfusion Information  A transfusion is the replacement of blood or some of its parts. Blood is made up of multiple cells which provide different functions.  Red blood cells carry oxygen and are used for blood loss replacement.  White blood cells fight against infection.  Platelets control bleeding.  Plasma helps clot blood.  Other blood products are available for specialized needs, such as hemophilia or other clotting disorders. BEFORE THE TRANSFUSION  Who gives blood for transfusions?   Healthy volunteers who are fully evaluated to make sure their blood is safe. This is blood bank blood. Transfusion therapy is the safest it has ever  been in the practice of medicine. Before blood is taken from a donor, a complete history is taken to make sure that person has no history of diseases nor engages in risky social behavior (examples are intravenous drug use or sexual activity with multiple partners). The donor's travel history is screened to minimize risk of transmitting infections, such as malaria. The donated blood is tested for signs of infectious diseases, such as HIV and hepatitis. The blood is then tested to be sure it is compatible with you in order to minimize the chance of a transfusion reaction. If you or a relative donates blood, this is often done in anticipation of surgery and is not appropriate for emergency situations. It takes many days to process the donated blood. RISKS AND COMPLICATIONS Although transfusion therapy is very safe and saves many lives, the main dangers of transfusion include:   Getting an infectious disease.  Developing a transfusion reaction. This is an allergic reaction to something in the blood you were given. Every precaution is taken to prevent this. The decision to have a blood transfusion has been considered carefully by your caregiver before blood is given. Blood is not given unless the benefits outweigh the risks. AFTER THE TRANSFUSION  Right after receiving a blood  transfusion, you will usually feel much better and more energetic. This is especially true if your red blood cells have gotten low (anemic). The transfusion raises the level of the red blood cells which carry oxygen, and this usually causes an energy increase.  The nurse administering the transfusion will monitor you carefully for complications. HOME CARE INSTRUCTIONS  No special instructions are needed after a transfusion. You may find your energy is better. Speak with your caregiver about any limitations on activity for underlying diseases you may have. SEEK MEDICAL CARE IF:   Your condition is not improving after your  transfusion.  You develop redness or irritation at the intravenous (IV) site. SEEK IMMEDIATE MEDICAL CARE IF:  Any of the following symptoms occur over the next 12 hours:  Shaking chills.  You have a temperature by mouth above 102 F (38.9 C), not controlled by medicine.  Chest, back, or muscle pain.  People around you feel you are not acting correctly or are confused.  Shortness of breath or difficulty breathing.  Dizziness and fainting.  You get a rash or develop hives.  You have a decrease in urine output.  Your urine turns a dark color or changes to pink, red, or brown. Any of the following symptoms occur over the next 10 days:  You have a temperature by mouth above 102 F (38.9 C), not controlled by medicine.  Shortness of breath.  Weakness after normal activity.  The white part of the eye turns yellow (jaundice).  You have a decrease in the amount of urine or are urinating less often.  Your urine turns a dark color or changes to pink, red, or brown. Document Released: 02/25/2000 Document Revised: 05/22/2011 Document Reviewed: 10/14/2007 [facility] Patient Information 2014 [place], [place].  ______________________________________________________________________

## 2018-09-12 ENCOUNTER — Ambulatory Visit: Payer: Self-pay | Admitting: Surgery

## 2018-09-12 ENCOUNTER — Encounter (HOSPITAL_COMMUNITY): Payer: Self-pay

## 2018-09-12 ENCOUNTER — Encounter (INDEPENDENT_AMBULATORY_CARE_PROVIDER_SITE_OTHER): Payer: Self-pay

## 2018-09-12 ENCOUNTER — Other Ambulatory Visit: Payer: Self-pay

## 2018-09-12 ENCOUNTER — Encounter (HOSPITAL_COMMUNITY)
Admission: RE | Admit: 2018-09-12 | Discharge: 2018-09-12 | Disposition: A | Discharge status: Home / Self Care | Payer: Medicare Other | Source: Ambulatory Visit | Attending: Surgery | Admitting: Surgery

## 2018-09-12 DIAGNOSIS — Z01812 Encounter for preprocedural laboratory examination: Secondary | ICD-10-CM | POA: Insufficient documentation

## 2018-09-12 DIAGNOSIS — K635 Polyp of colon: Secondary | ICD-10-CM | POA: Diagnosis not present

## 2018-09-12 DIAGNOSIS — Z1159 Encounter for screening for other viral diseases: Secondary | ICD-10-CM | POA: Insufficient documentation

## 2018-09-12 HISTORY — DX: Transient cerebral ischemic attack, unspecified: G45.9

## 2018-09-12 LAB — CBC WITH DIFFERENTIAL/PLATELET
Abs Immature Granulocytes: 0.02 10*3/uL (ref 0.00–0.07)
Basophils Absolute: 0.1 10*3/uL (ref 0.0–0.1)
Basophils Relative: 1 %
Eosinophils Absolute: 0.4 10*3/uL (ref 0.0–0.5)
Eosinophils Relative: 5 %
HCT: 29.6 % — ABNORMAL LOW (ref 39.0–52.0)
Hemoglobin: 9.3 g/dL — ABNORMAL LOW (ref 13.0–17.0)
Immature Granulocytes: 0 %
Lymphocytes Relative: 16 %
Lymphs Abs: 1.2 10*3/uL (ref 0.7–4.0)
MCH: 30 pg (ref 26.0–34.0)
MCHC: 31.4 g/dL (ref 30.0–36.0)
MCV: 95.5 fL (ref 80.0–100.0)
Monocytes Absolute: 0.3 10*3/uL (ref 0.1–1.0)
Monocytes Relative: 4 %
Neutro Abs: 5.6 10*3/uL (ref 1.7–7.7)
Neutrophils Relative %: 74 %
Platelets: 323 10*3/uL (ref 150–400)
RBC: 3.1 MIL/uL — ABNORMAL LOW (ref 4.22–5.81)
RDW: 13.2 % (ref 11.5–15.5)
WBC: 7.5 10*3/uL (ref 4.0–10.5)
nRBC: 0 % (ref 0.0–0.2)

## 2018-09-12 LAB — COMPREHENSIVE METABOLIC PANEL
ALT: 14 U/L (ref 0–44)
AST: 16 U/L (ref 15–41)
Albumin: 4 g/dL (ref 3.5–5.0)
Alkaline Phosphatase: 53 U/L (ref 38–126)
Anion gap: 8 (ref 5–15)
BUN: 15 mg/dL (ref 8–23)
CO2: 23 mmol/L (ref 22–32)
Calcium: 8.6 mg/dL — ABNORMAL LOW (ref 8.9–10.3)
Chloride: 114 mmol/L — ABNORMAL HIGH (ref 98–111)
Creatinine, Ser: 1.2 mg/dL (ref 0.61–1.24)
GFR calc Af Amer: >60 mL/min (ref >60)
GFR calc non Af Amer: 57 mL/min — ABNORMAL LOW (ref >60)
Glucose, Bld: 110 mg/dL — ABNORMAL HIGH (ref 70–99)
Potassium: 3.8 mmol/L (ref 3.5–5.1)
Sodium: 145 mmol/L (ref 135–145)
Total Bilirubin: 1.2 mg/dL (ref 0.3–1.2)
Total Protein: 6.5 g/dL (ref 6.5–8.1)

## 2018-09-12 LAB — HEMOGLOBIN A1C
Hgb A1c MFr Bld: 5.2 % (ref 4.8–5.6)
Mean Plasma Glucose: 102.54 mg/dL

## 2018-09-12 NOTE — Progress Notes (Signed)
Pt presents to pre-op appt today and brings his bowel prep instructions he received from his surgeon , Dr Pollie Friar office. Patient proceeded to ask this RN " is this ERAS class when I also get my ENSURE drinks" as he pointed to the information in his bowel prep that mentions when to consume the 3 ensures. RN made patient aware that eras classes have been suspended due to covid and patient has no orders in epic for the ensure drinks.   RN then called and spoke with triage nurse Abigail Butts at [place] to inform of the above and request orders be placed so RN could give patient the ensure drinks. Per wendy , dr [name] out of the office, unreachable,  and will no return until Monday , 09-16-18 but that  she would try to have another provider in the office place the orders in. Patient placed in waiting room to wait on orders while RN saw next appt. After 40 min, wendy called back and states that Dr Rosendo Gros ([facility] [place]) , is not comfortable with placing orders in epic on behalf of [name] since he is specific with his orders. Abigail Butts then asks RN to tell patient that [name] will be back on Monday and will placed orders in epic. RN asked patient to come pick up drinks on Monday from PST . Patient agrees.

## 2018-09-13 ENCOUNTER — Other Ambulatory Visit (HOSPITAL_COMMUNITY)
Admission: RE | Admit: 2018-09-13 | Discharge: 2018-09-13 | Disposition: A | Discharge status: Home / Self Care | Payer: Medicare Other | Source: Ambulatory Visit | Attending: Surgery | Admitting: Surgery

## 2018-09-13 DIAGNOSIS — K635 Polyp of colon: Secondary | ICD-10-CM | POA: Diagnosis not present

## 2018-09-13 DIAGNOSIS — Z1159 Encounter for screening for other viral diseases: Secondary | ICD-10-CM | POA: Diagnosis not present

## 2018-09-13 DIAGNOSIS — Z01812 Encounter for preprocedural laboratory examination: Secondary | ICD-10-CM | POA: Diagnosis not present

## 2018-09-13 LAB — SARS CORONAVIRUS 2 (TAT 6-24 HRS): SARS Coronavirus 2: NEGATIVE

## 2018-09-16 MED ORDER — BUPIVACAINE LIPOSOME 1.3 % IJ SUSP
20.0000 mL | Freq: Once | INTRAMUSCULAR | Status: DC
  Filled 2018-09-16: qty 20

## 2018-09-16 NOTE — Anesthesia Preprocedure Evaluation (Addendum)
Anesthesia Evaluation  Patient identified by MRN, date of birth, ID band Patient awake    Reviewed: Allergy & Precautions, H&P , NPO status , Patient's Chart, lab work & pertinent test results  Airway Mallampati: II  TM Distance: >3 FB Neck ROM: full    Dental  (+) Dental Advisory Given   Pulmonary    Pulmonary exam normal breath sounds clear to auscultation       Cardiovascular hypertension, + angina + CAD and + Past MI  Normal cardiovascular exam Rhythm:Regular Rate:Normal     Neuro/Psych    GI/Hepatic   Endo/Other    Renal/GU      Musculoskeletal   Abdominal   Peds  Hematology   Anesthesia Other Findings   Reproductive/Obstetrics                            Anesthesia Physical  Anesthesia Plan  ASA: III  Anesthesia Plan: General   Post-op Pain Management:    Induction: Intravenous  PONV Risk Score and Plan: 4 or greater and Ondansetron, Dexamethasone and Treatment may vary due to age or medical condition  Airway Management Planned: Oral ETT  Additional Equipment:   Intra-op Plan:   Post-operative Plan: Extubation in OR  Informed Consent: I have reviewed the patients History and Physical, chart, labs and discussed the procedure including the risks, benefits and alternatives for the proposed anesthesia with the patient or authorized representative who has indicated his/her understanding and acceptance.     Dental advisory given  Plan Discussed with: CRNA  Anesthesia Plan Comments:         Anesthesia Quick Evaluation

## 2018-09-16 NOTE — Progress Notes (Signed)
F/u from 09-12-2018 progress note  RN contacted patient to inform him that orders have been placed in epic for ERAS ensure drinks. Patient explained to RN that Abigail Butts called him at 8:15am this morning and told him that he is not required to pick consume the pre-op EARS ensures drinks for his upcoming. RN expressed understanding and tell patient that this will be noted as this is a deviation from planned discussed on 09-12-2018.

## 2018-09-16 NOTE — H&P (Signed)
[place]  Location: [facility] Patient #: [id number] DOB: 01/27/1941 Undefined / Language: Cleophus Molt / Race: White Male  History of Present Illness   The patient is a 78 year old male who presents with a complaint of descending colon mass.  Goes by "Gene"  The PCP is Dr. Alfonso Patten. [name]  The patient was referred by Dr. Rikki Spearing  He is by himself today.  [The Covid-19 virus has disrupted normal medical care in [place] and across the nation. We have sometimes had to alter normal surgical/medical care to limit this epidemic and we have explained these changes to the patient.]  The patient is on Plavix for history of a stroke and MI. His last colonoscopy was in 2008. 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 This only occurred one time. But this prompted a colonoscopy. She had a colonoscopy by Dr. Henrene Pastor on 06/20/2018. He found a left descending colon mass that showed fragments of a serrated adenoma on pathology (no atypia). He was admitted after the colonoscopy for a bleed from the biopsy sites, but he had not stopped his Plavix. He is doing well now. He has no history of stomach, liver, or pancreas problems. He is only prior abdominal surgery was a cholecystectomy in 2013.  I reviewed with the patient the findings and need for colon surgery. I discussed both surgical and non surgical options. I discussed the role of laparoscopic and open surgery in colon surgery. I reviewed the risks of surgery, including, but not limited to, infection, bleeding, nerve injury, anastomotic leaks, and possibility of colostomy.  I went over the preoperative mechanical and antibiotic bowel prep for colon surgery and provided prescriptions for these.  I provided the patient literature on colon surgery. I tried to answer all questions for the patient.  Plan: 1) Cardiac clearance - Dr. [place], 2)  Laparoscopic assisted left colectomy, 3) Mechanical and antibiotic bowel prep, 4) Stop Plavix 5 days before surgery, Review of Systems as stated in this history (HPI) or in the review of systems. Otherwise all other 12 point ROS are negative  Past Medical History: 1. Left colon polyp 2. On plavix 3. CAD History of MI in 03/2005 Myoview study in Jan 2018 showed a defect in the inferior lateral wall - stable He sees Dr. Mamie Nick. [place] 4. HTN 5. Macular degeneration Sees Dr. Katy Fitch locally and Dr. Amedeo Plenty at [facility]. 6. Lap chole - 02/2012 - [place] 7. History of CVA Seen on opthalmologic exam. No lateralizing symptoms. 8. Left knee surgery - 1980 - Sr. Collie Siad  Social History: Divorced. He lives with a sister, who is a survivor from an autoaccident, which limits her some. He has one other sister. He has one daughter, who is 25 yo and lives in [place]. They are not close. He is retired from IT sales professional.   Past Surgical History Geni Bers [place], RMA; 07/24/2018 10:08 AM) Colon Polyp Removal - Open  Gallbladder Surgery - Open  Knee Surgery  Left.  Diagnostic Studies History Geni Bers [place], RMA; 07/24/2018 10:08 AM) Colonoscopy  within last year  Allergies Geni Bers [place], RMA; 07/24/2018 10:09 AM) No Known Drug Allergies  [07/24/2018]: Allergies Reconciled   Medication History [employer], RMA; 07/24/2018 10:10 AM) Multi Vitamin (Oral) Active. Plavix (75MG  Tablet, Oral) Active. Toprol XL (50MG  Tablet ER 24HR, Oral) Active. Ramipril (10MG  Capsule, Oral) Active. Zocor (80MG  Tablet, Oral) Active. Medications Reconciled  Social History Producer, television/film/video, RMA;  07/24/2018 10:08 AM) Alcohol use  Moderate alcohol use. Caffeine use  Carbonated beverages, Coffee. No drug use  Tobacco use  Never smoker.  Family History Geni Bers [place], RMA; 07/24/2018 10:08 AM) Alcohol Abuse  Sister. Breast  Cancer  Mother. Colon Polyps  Sister. Heart disease in male family member before age 21   Other Problems Marguarite Arbour, RMA; 07/24/2018 10:08 AM) Cerebrovascular Accident     Review of Systems Geni Bers [place] RMA; 07/24/2018 10:08 AM) General Not Present- Appetite Loss, Chills, Fatigue, Fever, Night Sweats, Weight Gain and Weight Loss. Skin Not Present- Change in Wart/Mole, Dryness, Hives, Jaundice, New Lesions, Non-Healing Wounds, Rash and Ulcer. HEENT Not Present- Earache, Hearing Loss, Hoarseness, Nose Bleed, Oral Ulcers, Ringing in the Ears, Seasonal Allergies, Sinus Pain, Sore Throat, Visual Disturbances, Wears glasses/contact lenses and Yellow Eyes. Respiratory Not Present- Bloody sputum, Chronic Cough, Difficulty Breathing, Snoring and Wheezing. Breast Not Present- Breast Mass, Breast Pain, Nipple Discharge and Skin Changes. Cardiovascular Not Present- Chest Pain, Difficulty Breathing Lying Down, Leg Cramps, Palpitations, Rapid Heart Rate, Shortness of Breath and Swelling of Extremities. Gastrointestinal Not Present- Abdominal Pain, Bloating, Bloody Stool, Change in Bowel Habits, Chronic diarrhea, Constipation, Difficulty Swallowing, Excessive gas, Gets full quickly at meals, Hemorrhoids, Indigestion, Nausea, Rectal Pain and Vomiting. Male Genitourinary Not Present- Blood in Urine, Change in Urinary Stream, Frequency, Impotence, Nocturia, Painful Urination, Urgency and Urine Leakage. Musculoskeletal Not Present- Back Pain, Joint Pain, Joint Stiffness, Muscle Pain, Muscle Weakness and Swelling of Extremities. Neurological Not Present- Decreased Memory, Fainting, Headaches, Numbness, Seizures, Tingling, Tremor, Trouble walking and Weakness. Psychiatric Not Present- Anxiety, Bipolar, Change in Sleep Pattern, Depression, Fearful and Frequent crying. Endocrine Not Present- Cold Intolerance, Excessive Hunger, Hair Changes, Heat Intolerance, Hot flashes and New Diabetes. Hematology  Present- Blood Thinners and Easy Bruising. Not Present- Excessive bleeding, Gland problems, HIV and Persistent Infections.  Vitals [employer] Haggett RMA; 07/24/2018 10:10 AM) 07/24/2018 10:10 AM Weight: 214.6 lb Height: 74in Body Surface Area: 2.24 m Body Mass Index: 27.55 kg/m  Temp.: 98.59F (Oral)  Pulse: 97 (Regular)  P.OX: 99% (Room air) BP: 192/98(Sitting, Left Arm, Standard)  Physical Exam  General: WN older WM who is alert and generally healthy appearing. Skin: Inspection and palpation of the skin unremarkable.  Eyes: Conjunctivae white, pupils equal. Face, ears, nose, mouth, and throat: Face - normal. Normal ears and nose. Lips and teeth normal.  Neck: Supple. No mass. Trachea midline. No thyroid mass. Lymph Nodes: No supraclavicular or cervical adenopathy. No axillary adenopathy.  Lungs: Normal respiratory effort. Clear to auscultation and symmetric breath sounds. Cardiovascular: Regular rate and rythm. Normal auscultation of the heart. No murmur or rub. Chest: He has some bilatera gynecomastia  Abdomen: Soft. No mass. Liver and spleen not palpable. No tenderness. No hernia. Normal bowel sounds. No abdominal scars.  Average build Rectal: Not done.  Musculoskeletal/extremities: Normal gait. Good strength and ROM in upper and lower extremities.  Neurologic: Grossly intact to motor and sensory function. Psychiatric: Has normal mood and affect. Judgement and insight appear normal.  Assessment & Plan  1.  POLYP OF DESCENDING COLON, UNSPECIFIED TYPE (K63.5)  Plan:  1) Cardiac clearance - Dr. [place]   Note by Daune Perch, NP - 07/26/2018   2) Laparoscopic assisted left colectomy   3) Mechanical and antibiotic bowel prep   4) Stop Plavix 5 days before surgery   2.  ANTICOAGULATED (Z79.01)  Story: On plavix  Addendum Note(Vitaliy Eisenhour H. [name] MD; 09/05/2018 3:19 PM)  From Amy [place]: Dr [name] -  just wanted you to be aware this pt was hospitalized  this weekend with GI bleed - Diverticular vs bleed secondary to left Colon sessile serrated large polyp /mass.  Bleeding scan negative  Hgb 14 to 8.4  Repeat Colon was not done   Leaving him off Plavix for now, to start baby ASA daily, labs next week  He is scheduled with you for left Colon resection on 09/17/18 ---------------------------  I spoke to the patient. He has stopped the blood thinner. He was weak as kitten.  3. CAD  History of MI in 03/2005  Myoview study in Jan 2018 showed a defect in the inferior lateral wall - stable  He sees Dr. Mamie Nick. [place] 4. HTN 5. Macular degeneration  Sees Dr. Katy Fitch locally and Dr. Amedeo Plenty at [facility]. 6. History of CVA Seen on opthalmologic exam. No lateralizing symptoms.  Alphonsa Overall, MD, [facility] Pager: [phone] Office phone:  [phone]

## 2018-09-16 NOTE — Progress Notes (Signed)
Anesthesia Chart Review   Case: [id number] Date/Time: 09/17/18 0715   Procedure: LAPAROSCOPIC LEFT HEMICOLECTOMY (Left )   Anesthesia type: General   Pre-op diagnosis: POLY LEFT COLON   Location: [facility]   Surgeon: Alphonsa Overall, MD      DISCUSSION:77 y.o. never smoker with h/o HTN, CAD, NSTEMI 2007, TIA, polyp left colon scheduled for above procedure 09/17/2018 with Dr. Alphonsa Overall.   Recent admission 6/19-6/22/2020 due to GI bleed.  Plavix held, started on ASA 81mg .  Hemodynamically stable at discharge with GI follow up recommended.  Hemaglobin 9.3 at PAT 09/12/2018, stable.   Pt cleared by cardiology 07/26/2018.  Per Johna Roles, NP, "[name] was last seen on 04/11/2018 by Dr. Martin[place].  Since that day, [name] has done very well. He has a remote history of MI in 2007 and no cardiac issues since then. He works every day and walks 3-4 miles, 4-5 times per week without any exertional symptoms. He is on Plavix for a ministroke in 2015. It is OK to hold Plavix for the procedure from a cardiac standpoint, however, the plavix is actually ordered by his GP and would need to be cleared by Dr. Osborne Casco or Dr. Louanne Belton. Therefore, based on ACC/AHA guidelines, the patient would be at acceptable risk for the planned procedure without further cardiovascular testing."  Anticipate pt can proceed with planned procedure barring acute status change.   VS: BP (!) 148/61 (BP Location: Right Arm)   Pulse 66   Temp 36.7 C (Oral)   Resp 18   Ht 6\' 2"  (1.88 m)   Wt 95.3 kg   SpO2 100%   BMI 26.99 kg/m   PROVIDERS: [name], [name], MD is PCP   Martinique, Peter, MD is Cardiologist  LABS: Labs reviewed: Acceptable for surgery. (all labs ordered are listed, but only abnormal results are displayed)  Labs Reviewed  CBC WITH DIFFERENTIAL/PLATELET - Abnormal; Notable for the following components:      Result Value   RBC 3.10 (*)    Hemoglobin 9.3 (*)    HCT 29.6 (*)    All other  components within normal limits  COMPREHENSIVE METABOLIC PANEL - Abnormal; Notable for the following components:   Chloride 114 (*)    Glucose, Bld 110 (*)    Calcium 8.6 (*)    GFR calc non Af Amer 57 (*)    All other components within normal limits  HEMOGLOBIN A1C     IMAGES:   EKG: 04/11/2018 Rate 65 bpm Sinus rhythm with premature atrial complexes   CV: Stress Test 04/19/16  The left ventricular ejection fraction is normal (55-65%).  Nuclear stress EF: 59%.  There was no ST segment deviation noted during stress.  No T wave inversion was noted during stress.  Blood pressure demonstrated a normal response to exercise.  Defect 1: There is a small defect of severe severity present in the basal inferolateral and mid inferolateral location.  Findings consistent with prior myocardial infarction with peri-infarct ischemia.  This is an intermediate risk study. Myoview study is unchanged from prior studies. Inferolateral infarct with mild peri-infarct ischemia. This is consistent with known occlusion of the left circumflex artery. EF is normal. Otherwise normal perfusion.Continue medical therapy.  Dr. Peter Martinique.  Echo 03/17/2005 LEFT VENTRICLE:  - Left ventricular size was normal.  - Overall left ventricular systolic function was normal.  - Left ventricular ejection fraction was estimated , range being 55     %  to 65 %..  - mild hypokinesis of the posterolateral wall.  - Left ventricular wall thickness was normal.  AORTIC VALVE:  - The aortic valve was trileaflet.  - Aortic valve thickness was normal.  - There was normal aortic valve leaflet excursion.  Doppler interpretation(s):  - Transaortic velocity was within the normal range.  - There was no evidence for aortic valve stenosis.  - There was no significant aortic valvular regurgitation.  AORTA:  - The aortic root was normal in size.  MITRAL VALVE:  - Mitral valve structure was  normal.  - There was normal mitral valve leaflet excursion.  Doppler interpretation(s):  - The transmitral velocity was within the normal range.  - There was no evidence for mitral stenosis.  - There was no significant mitral valvular regurgitation.  LEFT ATRIUM:  - Left atrial size was normal.  RIGHT VENTRICLE:  - Right ventricular size was normal.  - Right ventricular systolic function was normal.  - Right ventricular wall thickness was normal.  PULMONIC VALVE:  - The structure of the pulmonic valve appeared to be normal.  TRICUSPID VALVE:  - The tricuspid valve structure was normal.  Doppler interpretation(s):  - There was no significant tricuspid valvular regurgitation.  RIGHT ATRIUM:  - Right atrial size was normal.  PERICARDIUM:  - There was no pericardial effusion.  ---------------------------------------------------------------  SUMMARY  - Overall left ventricular systolic function was normal. Left     ventricular ejection fraction was estimated , range being 55     % to 65 %.. mild hypokinesis of the posterolateral wall. Past Medical History:  Diagnosis Date  . Acute cholecystitis 02/14/2012  . Anginal pain ([facility])   . CAD (coronary artery disease) 2007   occlusion of mid LCX, 50-70% LAD  . Cataract 2010   bilateral eyes  . Choledocholithiasis 02/14/2012  . Dyslipidemia   . HTN (hypertension)   . Macular degeneration   . Myocardial infarction ([place]) 1/07   Acute non-ST-elevation myocardial infarction  . Overweight(278.02)    with body mass index of 29  . TIA (transient ischemic attack)    PER PATIENT , HIS EYE DOCTOR SAW ABNORMALILTY DURING EYE EXAM . TOLD HIM HE HAD A SMALL STROKE  AND THAT WAS THE CAUSE OF A C/O DYSPHAGIA   AT THE TIME, NO RECURRENCE  SINCE THAT TIME, DYSPHAGIA  NOW SINCE IMPROVED  WITH AVOIDANCE OF STRIGNY FOODS     Past Surgical History:  Procedure Laterality Date  . CARDIAC CATHETERIZATION   03/17/2005   Ejection fraction is estimated at 55%  . CATARACT EXTRACTION W/ INTRAOCULAR LENS  IMPLANT, BILATERAL  ~ 2010  . CHOLECYSTECTOMY  02/15/2012   Procedure: LAPAROSCOPIC CHOLECYSTECTOMY WITH INTRAOPERATIVE CHOLANGIOGRAM;  Surgeon: Stark Klein, MD;  Location: [facility];  Service: General;  Laterality: N/A;  . KNEE SURGERY  1980's   "took knee out and put it back in" ; left     MEDICATIONS: . aspirin EC 81 MG tablet  . metoprolol succinate (TOPROL-XL) 50 MG 24 hr tablet  . Multiple Vitamin (MULTIVITAMIN) tablet  . ramipril (ALTACE) 10 MG capsule  . simvastatin (ZOCOR) 80 MG tablet   No current facility-administered medications for this encounter.    Derrill Memo ON 09/17/2018] bupivacaine liposome (EXPAREL) 1.3 % injection 266 mg    Maia Plan [facility] Pre-Surgical Testing [phone] 09/16/18  10:08 AM

## 2018-09-17 ENCOUNTER — Other Ambulatory Visit: Payer: Self-pay

## 2018-09-17 ENCOUNTER — Inpatient Hospital Stay (HOSPITAL_COMMUNITY): Payer: Medicare Other | Admitting: Physician Assistant

## 2018-09-17 ENCOUNTER — Encounter (HOSPITAL_COMMUNITY)
Admission: RE | Disposition: A | Discharge status: Home / Self Care | Payer: Self-pay | Source: Home / Self Care | Attending: Surgery

## 2018-09-17 ENCOUNTER — Inpatient Hospital Stay (HOSPITAL_COMMUNITY): Payer: Medicare Other | Admitting: Certified Registered"

## 2018-09-17 ENCOUNTER — Inpatient Hospital Stay (HOSPITAL_COMMUNITY)
Admission: RE | Admit: 2018-09-17 | Discharge: 2018-09-20 | DRG: 330 | Disposition: A | Discharge status: Home / Self Care | Payer: Medicare Other | Attending: Surgery | Admitting: Surgery

## 2018-09-17 ENCOUNTER — Encounter (HOSPITAL_COMMUNITY): Payer: Self-pay

## 2018-09-17 DIAGNOSIS — Z79899 Other long term (current) drug therapy: Secondary | ICD-10-CM

## 2018-09-17 DIAGNOSIS — Z803 Family history of malignant neoplasm of breast: Secondary | ICD-10-CM | POA: Diagnosis not present

## 2018-09-17 DIAGNOSIS — Z7902 Long term (current) use of antithrombotics/antiplatelets: Secondary | ICD-10-CM

## 2018-09-17 DIAGNOSIS — Z8673 Personal history of transient ischemic attack (TIA), and cerebral infarction without residual deficits: Secondary | ICD-10-CM | POA: Diagnosis not present

## 2018-09-17 DIAGNOSIS — I10 Essential (primary) hypertension: Secondary | ICD-10-CM | POA: Diagnosis present

## 2018-09-17 DIAGNOSIS — K5730 Diverticulosis of large intestine without perforation or abscess without bleeding: Secondary | ICD-10-CM | POA: Diagnosis present

## 2018-09-17 DIAGNOSIS — I2510 Atherosclerotic heart disease of native coronary artery without angina pectoris: Secondary | ICD-10-CM | POA: Diagnosis present

## 2018-09-17 DIAGNOSIS — Z1159 Encounter for screening for other viral diseases: Secondary | ICD-10-CM

## 2018-09-17 DIAGNOSIS — K635 Polyp of colon: Principal | ICD-10-CM | POA: Diagnosis present

## 2018-09-17 DIAGNOSIS — Z8371 Family history of colonic polyps: Secondary | ICD-10-CM

## 2018-09-17 DIAGNOSIS — H3530 Unspecified macular degeneration: Secondary | ICD-10-CM | POA: Diagnosis present

## 2018-09-17 DIAGNOSIS — Z9049 Acquired absence of other specified parts of digestive tract: Secondary | ICD-10-CM

## 2018-09-17 DIAGNOSIS — D124 Benign neoplasm of descending colon: Secondary | ICD-10-CM | POA: Diagnosis not present

## 2018-09-17 DIAGNOSIS — D62 Acute posthemorrhagic anemia: Secondary | ICD-10-CM | POA: Diagnosis not present

## 2018-09-17 DIAGNOSIS — N62 Hypertrophy of breast: Secondary | ICD-10-CM | POA: Diagnosis present

## 2018-09-17 DIAGNOSIS — I252 Old myocardial infarction: Secondary | ICD-10-CM

## 2018-09-17 DIAGNOSIS — I219 Acute myocardial infarction, unspecified: Secondary | ICD-10-CM | POA: Diagnosis not present

## 2018-09-17 DIAGNOSIS — K8042 Calculus of bile duct with acute cholecystitis without obstruction: Secondary | ICD-10-CM | POA: Diagnosis not present

## 2018-09-17 HISTORY — PX: LAPAROSCOPIC SIGMOID COLECTOMY: SHX5928

## 2018-09-17 SURGERY — COLECTOMY, SIGMOID, LAPAROSCOPIC
Anesthesia: General | Site: Abdomen | Laterality: Left

## 2018-09-17 MED ORDER — METOPROLOL TARTRATE 5 MG/5ML IV SOLN
INTRAVENOUS | Status: DC | PRN
  Administered 2018-09-17: 1 mg via INTRAVENOUS

## 2018-09-17 MED ORDER — MEPERIDINE HCL 50 MG/ML IJ SOLN: 6.2500 mg | INTRAMUSCULAR | Status: DC | PRN

## 2018-09-17 MED ORDER — MORPHINE SULFATE (PF) 2 MG/ML IV SOLN: 1.0000 mg | INTRAVENOUS | Status: DC | PRN

## 2018-09-17 MED ORDER — BUPIVACAINE LIPOSOME 1.3 % IJ SUSP
INTRAMUSCULAR | Status: DC | PRN
  Administered 2018-09-17: 20 mL

## 2018-09-17 MED ORDER — DEXAMETHASONE SODIUM PHOSPHATE 10 MG/ML IJ SOLN
INTRAMUSCULAR | Status: DC | PRN
  Administered 2018-09-17: 8 mg via INTRAVENOUS

## 2018-09-17 MED ORDER — CHLORHEXIDINE GLUCONATE 4 % EX LIQD: 60.0000 mL | Freq: Once | CUTANEOUS | Status: DC

## 2018-09-17 MED ORDER — GABAPENTIN 300 MG PO CAPS
300.0000 mg | ORAL_CAPSULE | ORAL | Status: AC
  Administered 2018-09-17: 300 mg via ORAL
  Filled 2018-09-17: qty 1

## 2018-09-17 MED ORDER — TRAMADOL HCL 50 MG PO TABS: 50.0000 mg | ORAL_TABLET | Freq: Four times a day (QID) | ORAL | Status: DC | PRN

## 2018-09-17 MED ORDER — LIDOCAINE 2% (20 MG/ML) 5 ML SYRINGE
INTRAMUSCULAR | Status: DC | PRN
  Administered 2018-09-17: 1.5 mg/kg/h via INTRAVENOUS

## 2018-09-17 MED ORDER — 0.9 % SODIUM CHLORIDE (POUR BTL) OPTIME
TOPICAL | Status: DC | PRN
  Administered 2018-09-17: 2000 mL

## 2018-09-17 MED ORDER — PROPOFOL 10 MG/ML IV BOLUS
INTRAVENOUS | Status: AC
  Filled 2018-09-17: qty 20

## 2018-09-17 MED ORDER — SUCCINYLCHOLINE CHLORIDE 200 MG/10ML IV SOSY
PREFILLED_SYRINGE | INTRAVENOUS | Status: AC
  Filled 2018-09-17: qty 10

## 2018-09-17 MED ORDER — PHENYLEPHRINE 40 MCG/ML (10ML) SYRINGE FOR IV PUSH (FOR BLOOD PRESSURE SUPPORT)
PREFILLED_SYRINGE | INTRAVENOUS | Status: AC
  Filled 2018-09-17: qty 10

## 2018-09-17 MED ORDER — ONDANSETRON HCL 4 MG/2ML IJ SOLN: 4.0000 mg | Freq: Once | INTRAMUSCULAR | Status: DC | PRN

## 2018-09-17 MED ORDER — ONDANSETRON HCL 4 MG/2ML IJ SOLN
INTRAMUSCULAR | Status: AC
  Filled 2018-09-17: qty 2

## 2018-09-17 MED ORDER — ROCURONIUM BROMIDE 10 MG/ML (PF) SYRINGE
PREFILLED_SYRINGE | INTRAVENOUS | Status: DC | PRN
  Administered 2018-09-17: 50 mg via INTRAVENOUS
  Administered 2018-09-17: 10 mg via INTRAVENOUS

## 2018-09-17 MED ORDER — SUGAMMADEX SODIUM 200 MG/2ML IV SOLN
INTRAVENOUS | Status: DC | PRN
  Administered 2018-09-17: 200 mg via INTRAVENOUS

## 2018-09-17 MED ORDER — ONDANSETRON HCL 4 MG/2ML IJ SOLN
INTRAMUSCULAR | Status: DC | PRN
  Administered 2018-09-17: 4 mg via INTRAVENOUS

## 2018-09-17 MED ORDER — ENSURE PRE-SURGERY PO LIQD: 296.0000 mL | Freq: Once | ORAL | Status: DC

## 2018-09-17 MED ORDER — ROCURONIUM BROMIDE 10 MG/ML (PF) SYRINGE
PREFILLED_SYRINGE | INTRAVENOUS | Status: AC
  Filled 2018-09-17: qty 10

## 2018-09-17 MED ORDER — ONDANSETRON HCL 4 MG/2ML IJ SOLN: 4.0000 mg | Freq: Four times a day (QID) | INTRAMUSCULAR | Status: DC | PRN

## 2018-09-17 MED ORDER — KCL IN DEXTROSE-NACL 20-5-0.45 MEQ/L-%-% IV SOLN
INTRAVENOUS | Status: DC
  Administered 2018-09-17 – 2018-09-19 (×6): via INTRAVENOUS
  Filled 2018-09-17 (×6): qty 1000

## 2018-09-17 MED ORDER — ENSURE PRE-SURGERY PO LIQD: 592.0000 mL | Freq: Once | ORAL | Status: DC

## 2018-09-17 MED ORDER — DEXAMETHASONE SODIUM PHOSPHATE 10 MG/ML IJ SOLN
INTRAMUSCULAR | Status: AC
  Filled 2018-09-17: qty 1

## 2018-09-17 MED ORDER — PHENYLEPHRINE 40 MCG/ML (10ML) SYRINGE FOR IV PUSH (FOR BLOOD PRESSURE SUPPORT)
PREFILLED_SYRINGE | INTRAVENOUS | Status: DC | PRN
  Administered 2018-09-17 (×4): 120 ug via INTRAVENOUS
  Administered 2018-09-17: 80 ug via INTRAVENOUS
  Administered 2018-09-17: 120 ug via INTRAVENOUS

## 2018-09-17 MED ORDER — HEPARIN SODIUM (PORCINE) 5000 UNIT/ML IJ SOLN
5000.0000 [IU] | Freq: Once | INTRAMUSCULAR | Status: AC
  Administered 2018-09-17: 5000 [IU] via SUBCUTANEOUS
  Filled 2018-09-17: qty 1

## 2018-09-17 MED ORDER — ALVIMOPAN 12 MG PO CAPS
12.0000 mg | ORAL_CAPSULE | Freq: Two times a day (BID) | ORAL | Status: DC
  Administered 2018-09-18: 09:00:00 12 mg via ORAL
  Filled 2018-09-17: qty 1

## 2018-09-17 MED ORDER — LIDOCAINE HCL 2 % IJ SOLN
INTRAMUSCULAR | Status: AC
  Filled 2018-09-17: qty 20

## 2018-09-17 MED ORDER — SODIUM CHLORIDE 0.9 % IV SOLN
2.0000 g | INTRAVENOUS | Status: AC
  Administered 2018-09-17: 07:00:00 2 g via INTRAVENOUS
  Filled 2018-09-17: qty 2

## 2018-09-17 MED ORDER — ACETAMINOPHEN 500 MG PO TABS
1000.0000 mg | ORAL_TABLET | ORAL | Status: AC
  Administered 2018-09-17: 1000 mg via ORAL
  Filled 2018-09-17: qty 2

## 2018-09-17 MED ORDER — ACETAMINOPHEN 500 MG PO TABS
1000.0000 mg | ORAL_TABLET | Freq: Three times a day (TID) | ORAL | Status: DC
  Administered 2018-09-17 – 2018-09-20 (×10): 1000 mg via ORAL
  Filled 2018-09-17 (×10): qty 2

## 2018-09-17 MED ORDER — SACCHAROMYCES BOULARDII 250 MG PO CAPS
250.0000 mg | ORAL_CAPSULE | Freq: Two times a day (BID) | ORAL | Status: DC
  Administered 2018-09-18 – 2018-09-20 (×5): 250 mg via ORAL
  Filled 2018-09-17 (×5): qty 1

## 2018-09-17 MED ORDER — HYDROCODONE-ACETAMINOPHEN 5-325 MG PO TABS: 1.0000 | ORAL_TABLET | ORAL | Status: DC | PRN

## 2018-09-17 MED ORDER — HYDROMORPHONE HCL 1 MG/ML IJ SOLN: 0.2500 mg | INTRAMUSCULAR | Status: DC | PRN

## 2018-09-17 MED ORDER — ALVIMOPAN 12 MG PO CAPS
12.0000 mg | ORAL_CAPSULE | ORAL | Status: AC
  Administered 2018-09-17: 12 mg via ORAL
  Filled 2018-09-17: qty 1

## 2018-09-17 MED ORDER — ENSURE SURGERY PO LIQD
237.0000 mL | Freq: Two times a day (BID) | ORAL | Status: DC
  Administered 2018-09-17 – 2018-09-19 (×5): 237 mL via ORAL
  Filled 2018-09-17 (×7): qty 237

## 2018-09-17 MED ORDER — SUCCINYLCHOLINE CHLORIDE 200 MG/10ML IV SOSY
PREFILLED_SYRINGE | INTRAVENOUS | Status: DC | PRN
  Administered 2018-09-17: 180 mg via INTRAVENOUS

## 2018-09-17 MED ORDER — ONDANSETRON HCL 4 MG PO TABS: 4.0000 mg | ORAL_TABLET | Freq: Four times a day (QID) | ORAL | Status: DC | PRN

## 2018-09-17 MED ORDER — PROPOFOL 10 MG/ML IV BOLUS
INTRAVENOUS | Status: DC | PRN
  Administered 2018-09-17: 100 mg via INTRAVENOUS

## 2018-09-17 MED ORDER — LACTATED RINGERS IV SOLN
INTRAVENOUS | Status: DC | PRN
  Administered 2018-09-17 (×2): via INTRAVENOUS

## 2018-09-17 MED ORDER — LIDOCAINE 2% (20 MG/ML) 5 ML SYRINGE
INTRAMUSCULAR | Status: DC | PRN
  Administered 2018-09-17: 40 mg via INTRAVENOUS

## 2018-09-17 MED ORDER — FENTANYL CITRATE (PF) 100 MCG/2ML IJ SOLN
INTRAMUSCULAR | Status: AC
  Filled 2018-09-17: qty 2

## 2018-09-17 MED ORDER — LIDOCAINE 2% (20 MG/ML) 5 ML SYRINGE
INTRAMUSCULAR | Status: AC
  Filled 2018-09-17: qty 5

## 2018-09-17 MED ORDER — FENTANYL CITRATE (PF) 100 MCG/2ML IJ SOLN
INTRAMUSCULAR | Status: DC | PRN
  Administered 2018-09-17: 50 ug via INTRAVENOUS

## 2018-09-17 MED ORDER — BUPIVACAINE-EPINEPHRINE (PF) 0.25% -1:200000 IJ SOLN
INTRAMUSCULAR | Status: AC
  Filled 2018-09-17: qty 30

## 2018-09-17 MED ORDER — BUPIVACAINE-EPINEPHRINE 0.25% -1:200000 IJ SOLN
INTRAMUSCULAR | Status: DC | PRN
  Administered 2018-09-17: 30 mL

## 2018-09-17 MED ORDER — SUGAMMADEX SODIUM 200 MG/2ML IV SOLN
INTRAVENOUS | Status: AC
  Filled 2018-09-17: qty 2

## 2018-09-17 MED ORDER — ENOXAPARIN SODIUM 40 MG/0.4ML ~~LOC~~ SOLN
40.0000 mg | SUBCUTANEOUS | Status: DC
  Administered 2018-09-18 – 2018-09-20 (×3): 40 mg via SUBCUTANEOUS
  Filled 2018-09-17 (×3): qty 0.4

## 2018-09-17 MED ORDER — RAMIPRIL 10 MG PO CAPS
10.0000 mg | ORAL_CAPSULE | Freq: Every day | ORAL | Status: DC
  Administered 2018-09-18 – 2018-09-20 (×3): 10 mg via ORAL
  Filled 2018-09-17 (×3): qty 1

## 2018-09-17 MED ORDER — METOPROLOL TARTRATE 5 MG/5ML IV SOLN
INTRAVENOUS | Status: AC
  Filled 2018-09-17: qty 5

## 2018-09-17 MED ORDER — METOPROLOL SUCCINATE ER 50 MG PO TB24
50.0000 mg | ORAL_TABLET | Freq: Every day | ORAL | Status: DC
  Administered 2018-09-17 – 2018-09-20 (×4): 50 mg via ORAL
  Filled 2018-09-17 (×4): qty 1

## 2018-09-17 SURGICAL SUPPLY — 67 items
APPLIER CLIP 5 13 M/L LIGAMAX5 (MISCELLANEOUS)
APPLIER CLIP ROT 10 11.4 M/L (STAPLE)
BLADE EXTENDED COATED 6.5IN (ELECTRODE) ×2 IMPLANT
BLADE HEX COATED 2.75 (ELECTRODE) IMPLANT
CABLE HIGH FREQUENCY MONO STRZ (ELECTRODE) ×3 IMPLANT
CELLS DAT CNTRL 66122 CELL SVR (MISCELLANEOUS) IMPLANT
CLIP APPLIE 5 13 M/L LIGAMAX5 (MISCELLANEOUS) IMPLANT
CLIP APPLIE ROT 10 11.4 M/L (STAPLE) IMPLANT
COUNTER NEEDLE 20 DBL MAG RED (NEEDLE) ×2 IMPLANT
COVER SURGICAL LIGHT HANDLE (MISCELLANEOUS) ×8 IMPLANT
COVER WAND RF STERILE (DRAPES) IMPLANT
DECANTER SPIKE VIAL GLASS SM (MISCELLANEOUS) ×3 IMPLANT
DERMABOND ADVANCED (GAUZE/BANDAGES/DRESSINGS) ×2
DERMABOND ADVANCED .7 DNX12 (GAUZE/BANDAGES/DRESSINGS) IMPLANT
DISSECTOR BLUNT TIP ENDO 5MM (MISCELLANEOUS) IMPLANT
DRAIN CHANNEL 19F RND (DRAIN) IMPLANT
DRAPE UTILITY XL STRL (DRAPES) ×3 IMPLANT
DRSG OPSITE POSTOP 4X10 (GAUZE/BANDAGES/DRESSINGS) IMPLANT
DRSG OPSITE POSTOP 4X6 (GAUZE/BANDAGES/DRESSINGS) ×2 IMPLANT
DRSG OPSITE POSTOP 4X8 (GAUZE/BANDAGES/DRESSINGS) IMPLANT
ELECT PENCIL ROCKER SW 15FT (MISCELLANEOUS) ×3 IMPLANT
ELECT REM PT RETURN 15FT ADLT (MISCELLANEOUS) ×3 IMPLANT
EVACUATOR SILICONE 100CC (DRAIN) IMPLANT
GAUZE SPONGE 4X4 12PLY STRL (GAUZE/BANDAGES/DRESSINGS) ×2 IMPLANT
GLOVE SURG SIGNA 7.5 PF LTX (GLOVE) ×6 IMPLANT
GOWN STRL REUS W/TWL XL LVL3 (GOWN DISPOSABLE) ×12 IMPLANT
HOLDER FOLEY CATH W/STRAP (MISCELLANEOUS) ×3 IMPLANT
KIT TURNOVER KIT A (KITS) ×2 IMPLANT
NDL HYPO 25X1 1.5 SAFETY (NEEDLE) ×1 IMPLANT
NEEDLE HYPO 25X1 1.5 SAFETY (NEEDLE) ×3 IMPLANT
PACK COLON (CUSTOM PROCEDURE TRAY) ×3 IMPLANT
PAD POSITIONING PINK XL (MISCELLANEOUS) ×3 IMPLANT
PORT LAP GEL ALEXIS MED 5-9CM (MISCELLANEOUS) ×2 IMPLANT
PROTECTOR NERVE ULNAR (MISCELLANEOUS) ×2 IMPLANT
RETRACTOR WND ALEXIS 18 MED (MISCELLANEOUS) IMPLANT
RTRCTR WOUND ALEXIS 18CM MED (MISCELLANEOUS)
SET IRRIG TUBING LAPAROSCOPIC (IRRIGATION / IRRIGATOR) ×3 IMPLANT
SET TUBE SMOKE EVAC HIGH FLOW (TUBING) ×3 IMPLANT
SHEARS HARMONIC ACE PLUS 36CM (ENDOMECHANICALS) ×2 IMPLANT
SLEEVE ADV FIXATION 5X100MM (TROCAR) ×7 IMPLANT
STAPLER VISISTAT 35W (STAPLE) ×3 IMPLANT
SURGILUBE 2OZ TUBE FLIPTOP (MISCELLANEOUS) ×2 IMPLANT
SUT ETHILON 3 0 PS 1 (SUTURE) IMPLANT
SUT MNCRL AB 4-0 PS2 18 (SUTURE) ×5 IMPLANT
SUT NOVA NAB DX-16 0-1 5-0 T12 (SUTURE) IMPLANT
SUT PDS AB 1 CTX 36 (SUTURE) ×6 IMPLANT
SUT PDS AB 1 TP1 96 (SUTURE) IMPLANT
SUT PROLENE 2 0 KS (SUTURE) IMPLANT
SUT PROLENE 2 0 SH DA (SUTURE) IMPLANT
SUT SILK 2 0 (SUTURE) ×2
SUT SILK 2 0 SH CR/8 (SUTURE) ×11 IMPLANT
SUT SILK 2-0 18XBRD TIE 12 (SUTURE) ×1 IMPLANT
SUT SILK 3 0 (SUTURE) ×2
SUT SILK 3 0 SH CR/8 (SUTURE) ×3 IMPLANT
SUT SILK 3-0 18XBRD TIE 12 (SUTURE) ×1 IMPLANT
SUT VIC AB 3-0 SH 18 (SUTURE) ×2 IMPLANT
SYS LAPSCP GELPORT 120MM (MISCELLANEOUS)
SYSTEM LAPSCP GELPORT 120MM (MISCELLANEOUS) IMPLANT
TOWEL OR NON WOVEN STRL DISP B (DISPOSABLE) IMPLANT
TRAY FOLEY MTR SLVR 16FR STAT (SET/KITS/TRAYS/PACK) ×2 IMPLANT
TROCAR ADV FIXATION 11X100MM (TROCAR) ×2 IMPLANT
TROCAR ADV FIXATION 12X100MM (TROCAR) IMPLANT
TROCAR ADV FIXATION 5X100MM (TROCAR) ×3 IMPLANT
TROCAR BLADELESS OPT 5 100 (ENDOMECHANICALS) ×3 IMPLANT
TROCAR XCEL BLUNT TIP 100MML (ENDOMECHANICALS) IMPLANT
TUBING CONNECTING 10 (TUBING) ×4 IMPLANT
TUBING CONNECTING 10' (TUBING) ×2

## 2018-09-17 NOTE — Transfer of Care (Signed)
Immediate Anesthesia Transfer of Care Note  Patient: [name]  Procedure(s) Performed: LAPAROSCOPIC LEFT HEMICOLECTOMY WITH MOBILIZATION OF SPLENNIC FLEXURE (Left Abdomen)  Patient Location: PACU  Anesthesia Type:General  Level of Consciousness: awake, alert  and oriented  Airway & Oxygen Therapy: Patient Spontanous Breathing and Patient connected to face mask oxygen  Post-op Assessment: Report given to RN and Post -op Vital signs reviewed and stable  Post vital signs: Reviewed and stable  Last Vitals:  Vitals Value Taken Time  BP 132/57 09/17/18 1113  Temp    Pulse 77 09/17/18 1114  Resp 19 09/17/18 1114  SpO2 86 % 09/17/18 1114  Vitals shown include unvalidated device data.  Last Pain:  Vitals:   09/17/18 0544  TempSrc:   PainSc: 0-No pain         Complications: No apparent anesthesia complications

## 2018-09-17 NOTE — Op Note (Signed)
09/17/2018  10:55 AM  PATIENT:  [name], 78 y.o., male, MRN: [id number]  PREOP DIAGNOSIS:  POLYP LEFT COLON  POSTOP DIAGNOSIS:   Approximate 2 cm polyp proximal left colon  PROCEDURE:   Procedure(s): Laparoscopically assisted LEFT HEMICOLECTOMY WITH MOBILIZATION OF SPLENNIC FLEXURE  SURGEON:   [name], M.D.  ASSISTANTRockne Skinner, M.D.  ANESTHESIA:   general  Anesthesiologist: Timothy Nations, MD CRNA: Timothy Covert, CRNA; Timothy Na, CRNA; Timothy Hummer, CRNA  General  EBL:  100  ml  BLOOD ADMINISTERED: none  DRAINS: none   LOCAL MEDICATIONS USED:   30 cc of 1/4% marcaine and 20 cc of Exparel  SPECIMEN:   Left colon, suture proximal  COUNTS CORRECT:  YES  INDICATIONS FOR PROCEDURE:  [name] is a 78 y.o. (DOB: May 31, 1940) white male whose primary care physician is Skinner, Timothy Him, MD and comes for left colectomy.   He had a colonoscopy by Dr. Henrene Skinner on 06/20/2018. Dr. Henrene Skinner found a left descending colon mass (about 2.0 cm) that showed fragments of a serrated adenoma on pathology (no atypia).    The indications and risks of the surgery were explained to the patient.  The risks include, but are not limited to, infection, bleeding, and nerve injury.  PROCEDURE: The patient was taken to room #1 at [name] operating room.  He was placed in lithotomy position.  He underwent general anesthesia.  He had a Foley catheter placed.  His abdomen and perineum were sterilely draped.  A timeout was held and the surgical checklist run  I accessed his right upper quadrant with a 5 mm Ethicon Optiview trocar.  I placed 4 additional 5 mm trochars: a 5 mm in his right midabdomen, a 5 mm trocar midway between his umbilicus and pubic bone, a 5 mm trocar in the left lower abdomen, and a 5 mm trocar in the left upper quadrant just above the umbilicus.  I placed a TAP block using a mixture of 1/4% marcaine and Exparel - 15 cc per side.  The right and  left lobe of the liver looked unremarkable, the stomach was unremarkable, the bowel that I could see was unremarkable.  He did have splattering of the dye used to mark the colon on the peritoneal surface.  He had no other gross intra-abdominal abnormality.  The tattooed area of the colon was the left proximal colon just beyond the splenic flexure.  So this was a little more proximally than I anticipated.    I mobilized the sigmoid colon and left colon starting at the pelvis and dissected towards the spleen.  I mobilized the transverse colon by getting the lesser sac between the mid transverse colon and stomach.  I mobilized the splenic flexure of the colon away from the spleen.  I dissected the left colon off the renal fat posteriorly.  I had the left colon mobilized that it would easily reach the midline.   I then made a transverse incision to the left of midline just above the umbilicus.    For the laparotomy incision, I went through the anterior rectus fascia transversely, I did a  muscle-splitting of the rectus muscle, and then divided the posterior rectus fascia transversely to enter the abdominal cavity.  I placed a wound protector in the wound.   I was able to bring the tattooed left colon into the abdominal wound.  I was able to divide about 5 inches on both sides of the  tattooed colon.  The  resection was about 10 inches of his left colon.  The sigmoid colon had significant diverticulosis and I divided the colon distally at about the left colon sigmoid colon junction.  I divided the mesentery of the colon with harmonic scalpel and 2-0 silk suture.  And then divided the proximal colon which was about at the splenic flexure.  A suture was placed proximally on the left colon specimen.  I opened the left colon specimen on a side table and identified the tumor that had been seen by Dr. Henrene Skinner.  The tumor was in the Skinner of the specimen.  I then carried out a handsewn end-to-end anastomosis between the  left transverse colon/splenic flexure and the proximal sigmoid colon.  I did an inverting suture of interrupted 2-0 silk.  This created a 2 fingerbreadth anastomosis.  The bowel on both ends looked healthy.  I then reduced the anastomosis back into the abdominal cavity.  I did not try to close the mesenteric defect.  I irrigated the abdomen with 3 L of saline.  I then laparoscoped the patient to reexamine where the anastomosis lay.  It lay in the mid left colon gutter.  The anastomosis was not under tension.  There was no obvious bleeding.  I then removed the wound protector.  And we switched to clean instruments.  I closed the posterior rectus fascia with 2 running #1 PDS sutures.  I infiltrated 15 more cc of the local anesthetic.  I closed the anterior rectus fascia with 2 running #1 PDS sutures.  I irrigated the subcutaneous fat closed the subcu fat with 3-0 Vicryl sutures.  And closed the main wound and all the trocar sites with 4-0 Monocryl suture.  And the wound was then painted with Dermabond.  The patient was transported to the recovery room in good condition.  Sponge and needle count were correct at the end the case.  Timothy Overall, MD, [facility] Surgery Pager: [phone] Office phone:  [phone]

## 2018-09-17 NOTE — Anesthesia Postprocedure Evaluation (Signed)
Anesthesia Post Note  Patient: [name]  Procedure(s) Performed: LAPAROSCOPIC LEFT HEMICOLECTOMY WITH MOBILIZATION OF SPLENNIC FLEXURE (Left Abdomen)     Patient location during evaluation: PACU Anesthesia Type: General Level of consciousness: sedated and patient cooperative Pain management: pain level controlled Vital Signs Assessment: post-procedure vital signs reviewed and stable Respiratory status: spontaneous breathing Cardiovascular status: stable Anesthetic complications: no    Last Vitals:  Vitals:   09/17/18 1425 09/17/18 1525  BP: 117/67 127/70  Pulse: 94 72  Resp: 14 15  Temp: (!) 36.3 C 36.6 C  SpO2:  98%    Last Pain:  Vitals:   09/17/18 1525  TempSrc: Oral  PainSc:                  Nolon Nations

## 2018-09-17 NOTE — Anesthesia Procedure Notes (Signed)
Procedure Name: Intubation Date/Time: 09/17/2018 7:28 AM Performed by: Niel Hummer, CRNA Pre-anesthesia Checklist: Patient being monitored, Suction available, Emergency Drugs available and Patient identified Patient Re-evaluated:Patient Re-evaluated prior to induction Oxygen Delivery Method: Circle system utilized Preoxygenation: Pre-oxygenation with 100% oxygen Induction Type: IV induction and Rapid sequence Laryngoscope Size: Mac and 4 Grade View: Grade I Tube type: Oral Tube size: 7.5 mm Number of attempts: 1 Airway Equipment and Method: Stylet Placement Confirmation: positive ETCO2,  ETT inserted through vocal cords under direct vision and breath sounds checked- equal and bilateral Secured at: 23 cm Tube secured with: Tape Dental Injury: Teeth and Oropharynx as per pre-operative assessment

## 2018-09-17 NOTE — Interval H&P Note (Signed)
History and Physical Interval Note:  09/17/2018 7:17 AM  [name]  has presented today for surgery, with the diagnosis of POLY LEFT COLON.  The various methods of treatment have been discussed with the patient and family.   Doing well.  No more bleeding.  Off anticoagulation.  After consideration of risks, benefits and other options for treatment, the patient has consented to  Procedure(s): LAPAROSCOPIC LEFT HEMICOLECTOMY (Left) as a surgical intervention.  The patient's history has been reviewed, patient examined, no change in status, stable for surgery.  I have reviewed the patient's chart and labs.  Questions were answered to the patient's satisfaction.     Shann Medal

## 2018-09-18 ENCOUNTER — Encounter (HOSPITAL_COMMUNITY): Payer: Self-pay | Admitting: Surgery

## 2018-09-18 LAB — CBC
HCT: 25.4 % — ABNORMAL LOW (ref 39.0–52.0)
Hemoglobin: 7.7 g/dL — ABNORMAL LOW (ref 13.0–17.0)
MCH: 28.3 pg (ref 26.0–34.0)
MCHC: 30.3 g/dL (ref 30.0–36.0)
MCV: 93.4 fL (ref 80.0–100.0)
Platelets: 241 10*3/uL (ref 150–400)
RBC: 2.72 MIL/uL — ABNORMAL LOW (ref 4.22–5.81)
RDW: 13.2 % (ref 11.5–15.5)
WBC: 13.2 10*3/uL — ABNORMAL HIGH (ref 4.0–10.5)
nRBC: 0 % (ref 0.0–0.2)

## 2018-09-18 LAB — BASIC METABOLIC PANEL
Anion gap: 5 (ref 5–15)
BUN: 13 mg/dL (ref 8–23)
CO2: 21 mmol/L — ABNORMAL LOW (ref 22–32)
Calcium: 7.9 mg/dL — ABNORMAL LOW (ref 8.9–10.3)
Chloride: 112 mmol/L — ABNORMAL HIGH (ref 98–111)
Creatinine, Ser: 0.93 mg/dL (ref 0.61–1.24)
GFR calc Af Amer: >60 mL/min (ref >60)
GFR calc non Af Amer: >60 mL/min (ref >60)
Glucose, Bld: 143 mg/dL — ABNORMAL HIGH (ref 70–99)
Potassium: 4.2 mmol/L (ref 3.5–5.1)
Sodium: 138 mmol/L (ref 135–145)

## 2018-09-18 NOTE — Progress Notes (Signed)
[place] [facility] Office:  [phone] General Surgery Progress Note   LOS: 1 day  POD -  1 Day Post-Op  Chief Complaint: Left colon polyp  Assessment and Plan: 1.  LAPAROSCOPIC Assisted LEFT HEMICOLECTOMY WITH MOBILIZATION OF SPLENNIC FLEXURE - 09/17/2018 - D. [employer] on clear liquids  2.  Off his normal oral anticoagulation 3. CAD        History of MI in 03/2005        Myoview study in Jan 2018 showed a defect in the inferior lateral wall - stable        He sees Dr. Mamie Nick. [place] 4. HTN 5. Macular degeneration             Sees Dr. Katy Fitch locally and Dr. Amedeo Plenty at [facility]. 6. History of CVA Seen on opthalmologic exam. No lateralizing symptoms. 7. DVT prophylaxis - Lovenox 8.  Anemia - combination of chronic anemia (pre op) and acute blood loss/dilution from surgery  Hgb - 7.7 - 09/18/2018 9.  D/C Foley   Active Problems:   Colonic polyp  Subjective:  Doing well.  Walked some last night.  Sore, but not too much.  Objective:   Vitals:   09/18/18 0235 09/18/18 0545  BP: 120/82 106/64  Pulse: 62 61  Resp: 18 18  Temp: 98.3 F (36.8 C) 98.1 F (36.7 C)  SpO2: 98% 98%     Intake/Output from previous day:  07/07 0701 - 07/08 0700 In: 4880.4 [P.O.:1420; I.V.:3360.4; IV Piggyback:100] Out: 1300 [Urine:1275; Blood:25]  Intake/Output this shift:  No intake/output data recorded.   Physical Exam:   General: WN older WM who is alert and oriented.    HEENT: Normal. Pupils equal. .   Lungs: Clear   IS = 1,200 cc   Abdomen: Soft.  No BS.    Wound: Clean   Lab Results:    Recent Labs    09/18/18 0425  WBC 13.2*  HGB 7.7*  HCT 25.4*  PLT 241    BMET   Recent Labs    09/18/18 0425  NA 138  K 4.2  CL 112*  CO2 21*  GLUCOSE 143*  BUN 13  CREATININE 0.93  CALCIUM 7.9*    PT/INR  No results for input(s): LABPROT, INR in the last 72 hours.  ABG  No results for input(s): PHART, HCO3 in the last 72 hours.  Invalid input(s):  PCO2, PO2   Studies/Results:  No results found.   Anti-infectives:   Anti-infectives (From admission, onward)   Start     Dose/Rate Route Frequency Ordered Stop   09/17/18 0600  cefoTEtan (CEFOTAN) 2 g in sodium chloride 0.9 % 100 mL IVPB     2 g 200 mL/hr over 30 Minutes Intravenous On call to O.R. 09/17/18 0527 09/17/18 0758      Alphonsa Overall, MD, FACS Pager: [facility] Office: [phone] 09/18/2018

## 2018-09-19 LAB — CBC WITH DIFFERENTIAL/PLATELET
Abs Immature Granulocytes: 0.04 10*3/uL (ref 0.00–0.07)
Basophils Absolute: 0 10*3/uL (ref 0.0–0.1)
Basophils Relative: 0 %
Eosinophils Absolute: 0.1 10*3/uL (ref 0.0–0.5)
Eosinophils Relative: 1 %
HCT: 23.6 % — ABNORMAL LOW (ref 39.0–52.0)
Hemoglobin: 7.2 g/dL — ABNORMAL LOW (ref 13.0–17.0)
Immature Granulocytes: 0 %
Lymphocytes Relative: 15 %
Lymphs Abs: 1.4 10*3/uL (ref 0.7–4.0)
MCH: 28.6 pg (ref 26.0–34.0)
MCHC: 30.5 g/dL (ref 30.0–36.0)
MCV: 93.7 fL (ref 80.0–100.0)
Monocytes Absolute: 0.5 10*3/uL (ref 0.1–1.0)
Monocytes Relative: 6 %
Neutro Abs: 7.2 10*3/uL (ref 1.7–7.7)
Neutrophils Relative %: 78 %
Platelets: 168 10*3/uL (ref 150–400)
RBC: 2.52 MIL/uL — ABNORMAL LOW (ref 4.22–5.81)
RDW: 13.3 % (ref 11.5–15.5)
WBC: 9.2 10*3/uL (ref 4.0–10.5)
nRBC: 0 % (ref 0.0–0.2)

## 2018-09-19 LAB — BASIC METABOLIC PANEL
Anion gap: 5 (ref 5–15)
BUN: 9 mg/dL (ref 8–23)
CO2: 23 mmol/L (ref 22–32)
Calcium: 7.8 mg/dL — ABNORMAL LOW (ref 8.9–10.3)
Chloride: 111 mmol/L (ref 98–111)
Creatinine, Ser: 0.87 mg/dL (ref 0.61–1.24)
GFR calc Af Amer: >60 mL/min (ref >60)
GFR calc non Af Amer: >60 mL/min (ref >60)
Glucose, Bld: 112 mg/dL — ABNORMAL HIGH (ref 70–99)
Potassium: 3.7 mmol/L (ref 3.5–5.1)
Sodium: 139 mmol/L (ref 135–145)

## 2018-09-19 NOTE — Plan of Care (Signed)
Patient lying in bed this morning; has been up to bathroom already today. Ambulated in hall early this am with no issues. Pain controlled at this time. Will continue to monitor.

## 2018-09-19 NOTE — Progress Notes (Addendum)
[place] [facility] Office:  [phone] General Surgery Progress Note   LOS: 2 days  POD -  2 Days Post-Op  Chief Complaint: Left colon polyp  Assessment and Plan: 1.  LAPAROSCOPIC Assisted LEFT HEMICOLECTOMY WITH MOBILIZATION OF SPLENNIC FLEXURE - 09/17/2018 - D. Zohra Clavel  Will advance to full liqufds  Ambulating well  2.  Off his normal oral anticoagulation 3. CAD        History of MI in 03/2005        Myoview study in Jan 2018 showed a defect in the inferior lateral wall - stable        He sees Dr. Mamie Nick. [place] 4. HTN 5. Macular degeneration             Sees Dr. Katy Fitch locally and Dr. Amedeo Plenty at [facility]. 6. History of CVA Seen on opthalmologic exam. No lateralizing symptoms. 7. DVT prophylaxis - Lovenox 8.  Anemia - combination of chronic anemia (pre op) and acute blood loss/dilution from surgery  Hgb - 7.2 - 09/19/2018  Continue to check his Hgb   Active Problems:   Colonic polyp  Subjective:  Doing well.  Had small BM.  Walking in hall well.  Objective:   Vitals:   09/18/18 2050 09/19/18 0454  BP: 131/80 (!) 143/73  Pulse: 72 75  Resp: (!) 22 20  Temp: 98.7 F (37.1 C) 98.9 F (37.2 C)  SpO2: 94% 94%     Intake/Output from previous day:  07/08 0701 - 07/09 0700 In: 2436.8 [P.O.:600; I.V.:1836.8] Out: 0   Intake/Output this shift:  Total I/O In: 540.1 [P.O.:240; I.V.:300.1] Out: -    Physical Exam:   General: WN older WM who is alert and oriented.    HEENT: Normal. Pupils equal. .   Lungs: Clear   IS = 1,100 cc   Abdomen: Soft.  Has active BS    Wound: Clean   Lab Results:    Recent Labs    09/18/18 0425 09/19/18 0417  WBC 13.2* 9.2  HGB 7.7* 7.2*  HCT 25.4* 23.6*  PLT 241 168    BMET   Recent Labs    09/18/18 0425 09/19/18 0417  NA 138 139  K 4.2 3.7  CL 112* 111  CO2 21* 23  GLUCOSE 143* 112*  BUN 13 9  CREATININE 0.93 0.87  CALCIUM 7.9* 7.8*    PT/INR  No results for input(s): LABPROT, INR in the last  72 hours.  ABG  No results for input(s): PHART, HCO3 in the last 72 hours.  Invalid input(s): PCO2, PO2   Studies/Results:  No results found.   Anti-infectives:   Anti-infectives (From admission, onward)   Start     Dose/Rate Route Frequency Ordered Stop   09/17/18 0600  cefoTEtan (CEFOTAN) 2 g in sodium chloride 0.9 % 100 mL IVPB     2 g 200 mL/hr over 30 Minutes Intravenous On call to O.R. 09/17/18 0527 09/17/18 0758      Alphonsa Overall, MD, FACS Pager: [place] [facility] Office: [phone] 09/19/2018

## 2018-09-20 LAB — CBC WITH DIFFERENTIAL/PLATELET
Abs Immature Granulocytes: 0.02 10*3/uL (ref 0.00–0.07)
Basophils Absolute: 0 10*3/uL (ref 0.0–0.1)
Basophils Relative: 0 %
Eosinophils Absolute: 0.3 10*3/uL (ref 0.0–0.5)
Eosinophils Relative: 3 %
HCT: 24.7 % — ABNORMAL LOW (ref 39.0–52.0)
Hemoglobin: 7.5 g/dL — ABNORMAL LOW (ref 13.0–17.0)
Immature Granulocytes: 0 %
Lymphocytes Relative: 14 %
Lymphs Abs: 1.3 10*3/uL (ref 0.7–4.0)
MCH: 28.2 pg (ref 26.0–34.0)
MCHC: 30.4 g/dL (ref 30.0–36.0)
MCV: 92.9 fL (ref 80.0–100.0)
Monocytes Absolute: 0.6 10*3/uL (ref 0.1–1.0)
Monocytes Relative: 6 %
Neutro Abs: 6.9 10*3/uL (ref 1.7–7.7)
Neutrophils Relative %: 77 %
Platelets: 163 10*3/uL (ref 150–400)
RBC: 2.66 MIL/uL — ABNORMAL LOW (ref 4.22–5.81)
RDW: 13.4 % (ref 11.5–15.5)
WBC: 9.1 10*3/uL (ref 4.0–10.5)
nRBC: 0 % (ref 0.0–0.2)

## 2018-09-20 NOTE — Progress Notes (Addendum)
[place] [facility] Office:  [phone] General Surgery Progress Note   LOS: 3 days  POD -  3 Days Post-Op  Chief Complaint: Left colon polyp  Assessment and Plan: 1.  LAPAROSCOPIC Assisted LEFT HEMICOLECTOMY WITH MOBILIZATION OF SPLENNIC FLEXURE - 09/17/2018 - D. [employer] full liquids.  To advance to reg diet.  If tolerated, home later today.  2.  Off his normal oral anticoagulation 3. CAD        History of MI in 03/2005        Myoview study in Jan 2018 showed a defect in the inferior lateral wall - stable        He sees Dr. Mamie Nick. [place] 4. HTN 5. Macular degeneration             Sees Dr. Katy Fitch locally and Dr. Amedeo Plenty at [facility]. 6. History of CVA Seen on opthalmologic exam. No lateralizing symptoms. 7. DVT prophylaxis - Lovenox 8.  Anemia - combination of chronic anemia (pre op) and acute blood loss/dilution from surgery  Hgb - 7.5 - 09/20/2018   Active Problems:   Colonic polyp  Subjective:  Doing well.  He thinks he can try to go home later today.  We reviewed discharge orders.  Will give him regular food and check at noon today.  Objective:   Vitals:   09/19/18 2154 09/20/18 0612  BP: (!) 166/83 (!) 168/82  Pulse: 74 80  Resp: 20 20  Temp: 98.2 F (36.8 C) 98.1 F (36.7 C)  SpO2: 96% 96%     Intake/Output from previous day:  07/09 0701 - 07/10 0700 In: 1892.8 [P.O.:600; I.V.:1292.8] Out: -   Intake/Output this shift:  No intake/output data recorded.   Physical Exam:   General: WN older WM who is alert and oriented.    HEENT: Normal. Pupils equal. .   Lungs: Clear      Abdomen: Soft.  Has active BS    Wound: Clean.  Bandage removed.   Lab Results:    Recent Labs    09/19/18 0417 09/20/18 0320  WBC 9.2 9.1  HGB 7.2* 7.5*  HCT 23.6* 24.7*  PLT 168 163    BMET   Recent Labs    09/18/18 0425 09/19/18 0417  NA 138 139  K 4.2 3.7  CL 112* 111  CO2 21* 23  GLUCOSE 143* 112*  BUN 13 9  CREATININE 0.93 0.87   CALCIUM 7.9* 7.8*    PT/INR  No results for input(s): LABPROT, INR in the last 72 hours.  ABG  No results for input(s): PHART, HCO3 in the last 72 hours.  Invalid input(s): PCO2, PO2   Studies/Results:  No results found.   Anti-infectives:   Anti-infectives (From admission, onward)   Start     Dose/Rate Route Frequency Ordered Stop   09/17/18 0600  cefoTEtan (CEFOTAN) 2 g in sodium chloride 0.9 % 100 mL IVPB     2 g 200 mL/hr over 30 Minutes Intravenous On call to O.R. 09/17/18 0527 09/17/18 0758      Alphonsa Overall, MD, FACS Pager: [place] [facility] Office: [phone] 09/20/2018

## 2018-09-20 NOTE — Progress Notes (Signed)
Discharge instructions reviewed with patient. Questions answered. No prescriptions given. Family member en route to drive patient home. Donne Hazel, RN

## 2018-09-20 NOTE — Progress Notes (Signed)
Call made to [place] office to inquire about discharge. Office staff took the message and will forward it to Dr [name]. Donne Hazel, RN

## 2018-09-20 NOTE — Discharge Summary (Signed)
Physician Discharge Summary  Patient ID:  [name]  MRN: [id number]  DOB/AGE: 80/14/1942 80 y.o.  Admit date: 09/17/2018 Discharge date: 09/20/2018  Discharge Diagnoses:  1.  Serrated adenoma of left colon - 2.0 cm.  0/15 nodes.       No malignancy 2.  ANTICOAGULATED (Z79.01)             Story: On plavix  On hold for now. 3. CAD        History of MI in 03/2005        Myoview study in Jan 2018 showed a defect in the inferior lateral wall - stable        He sees Dr. Mamie Nick. [place] 4. HTN 5. Macular degeneration             Sees Dr. Katy Fitch locally and Dr. Amedeo Plenty at [facility] 6. History of CVA Seen on opthalmologic exam. No lateralizing symptoms. 7.  Anemia  Both of chronic disease and acute blood loss.  Discharge hemoglobin - 7.5   Active Problems:   Colonic polyp  Operation: Procedure(s): LAPAROSCOPIC LEFT HEMICOLECTOMY WITH MOBILIZATION OF SPLENNIC FLEXURE on 09/17/2018 [name]  Discharged Condition: good  Hospital Course: [name] is an 78 y.o. male whose primary care physician is [name], [name], MD and who was admitted 09/17/2018 with a chief complaint of left colon polyp. He was brought to the operating room on 09/17/2018 and underwent LAPAROSCOPIC LEFT HEMICOLECTOMY WITH MOBILIZATION OF SPLENNIC FLEXURE.   Post op, he did very well from a GI standpoint. He was advanced to a regular diet by the third day post op and had a BM.  His preop Hgb was 9.3.  Post op, his hemoglobin dropped to 7.2 on 09/19/2018.  On the day of discharge, his hemoglobin was 7.5.  He was asymptomatic.  But I held his anticoagulation until he is seen back in the office and I can recheck his hemoglobin.  The discharge instructions were reviewed with the patient.  Consults: None  Significant Diagnostic Studies: Results for orders placed or performed during the hospital encounter of 72/09/47  Basic metabolic panel  Result Value Ref Range   Sodium 138 135 - 145 mmol/L   Potassium 4.2 3.5 - 5.1 mmol/L   Chloride 112 (H) 98 - 111 mmol/L   CO2 21 (L) 22 - 32 mmol/L   Glucose, Bld 143 (H) 70 - 99 mg/dL   BUN 13 8 - 23 mg/dL   Creatinine, Ser 0.93 0.61 - 1.24 mg/dL   Calcium 7.9 (L) 8.9 - 10.3 mg/dL   GFR calc non Af Amer >60 >60 mL/min   GFR calc Af Amer >60 >60 mL/min   Anion gap 5 5 - 15  CBC  Result Value Ref Range   WBC 13.2 (H) 4.0 - 10.5 K/uL   RBC 2.72 (L) 4.22 - 5.81 MIL/uL   Hemoglobin 7.7 (L) 13.0 - 17.0 g/dL   HCT 25.4 (L) 39.0 - 52.0 %   MCV 93.4 80.0 - 100.0 fL   MCH 28.3 26.0 - 34.0 pg   MCHC 30.3 30.0 - 36.0 g/dL   RDW 13.2 11.5 - 15.5 %   Platelets 241 150 - 400 K/uL   nRBC 0.0 0.0 - 0.2 %  CBC with Differential/Platelet  Result Value Ref Range   WBC 9.2 4.0 - 10.5 K/uL   RBC 2.52 (L) 4.22 - 5.81 MIL/uL   Hemoglobin 7.2 (L) 13.0 - 17.0 g/dL   HCT 23.6 (L) 39.0 -  52.0 %   MCV 93.7 80.0 - 100.0 fL   MCH 28.6 26.0 - 34.0 pg   MCHC 30.5 30.0 - 36.0 g/dL   RDW 13.3 11.5 - 15.5 %   Platelets 168 150 - 400 K/uL   nRBC 0.0 0.0 - 0.2 %   Neutrophils Relative % 78 %   Neutro Abs 7.2 1.7 - 7.7 K/uL   Lymphocytes Relative 15 %   Lymphs Abs 1.4 0.7 - 4.0 K/uL   Monocytes Relative 6 %   Monocytes Absolute 0.5 0.1 - 1.0 K/uL   Eosinophils Relative 1 %   Eosinophils Absolute 0.1 0.0 - 0.5 K/uL   Basophils Relative 0 %   Basophils Absolute 0.0 0.0 - 0.1 K/uL   Immature Granulocytes 0 %   Abs Immature Granulocytes 0.04 0.00 - 0.07 K/uL  Basic metabolic panel  Result Value Ref Range   Sodium 139 135 - 145 mmol/L   Potassium 3.7 3.5 - 5.1 mmol/L   Chloride 111 98 - 111 mmol/L   CO2 23 22 - 32 mmol/L   Glucose, Bld 112 (H) 70 - 99 mg/dL   BUN 9 8 - 23 mg/dL   Creatinine, Ser 0.87 0.61 - 1.24 mg/dL   Calcium 7.8 (L) 8.9 - 10.3 mg/dL   GFR calc non Af Amer >60 >60 mL/min   GFR calc Af Amer >60 >60 mL/min   Anion gap 5 5 - 15  CBC with Differential/Platelet  Result Value Ref Range   WBC 9.1 4.0 - 10.5 K/uL   RBC 2.66 (L) 4.22 - 5.81  MIL/uL   Hemoglobin 7.5 (L) 13.0 - 17.0 g/dL   HCT 24.7 (L) 39.0 - 52.0 %   MCV 92.9 80.0 - 100.0 fL   MCH 28.2 26.0 - 34.0 pg   MCHC 30.4 30.0 - 36.0 g/dL   RDW 13.4 11.5 - 15.5 %   Platelets 163 150 - 400 K/uL   nRBC 0.0 0.0 - 0.2 %   Neutrophils Relative % 77 %   Neutro Abs 6.9 1.7 - 7.7 K/uL   Lymphocytes Relative 14 %   Lymphs Abs 1.3 0.7 - 4.0 K/uL   Monocytes Relative 6 %   Monocytes Absolute 0.6 0.1 - 1.0 K/uL   Eosinophils Relative 3 %   Eosinophils Absolute 0.3 0.0 - 0.5 K/uL   Basophils Relative 0 %   Basophils Absolute 0.0 0.0 - 0.1 K/uL   Immature Granulocytes 0 %   Abs Immature Granulocytes 0.02 0.00 - 0.07 K/uL  Type and screen [facility]  Result Value Ref Range   ABO/RH(D) A POS    Antibody Screen POS    Sample Expiration 09/20/2018,2359    Antibody Identification ANTI M    Unit Number [id number]    Blood Component Type RED CELLS,LR    Unit division 00    Status of Unit ALLOCATED    Donor AG Type NEGATIVE FOR M ANTIGEN    Transfusion Status OK TO TRANSFUSE    Crossmatch Result COMPATIBLE   BPAM RBC  Result Value Ref Range   Blood Product Unit Number [id number]    PRODUCT CODE [id number]    Unit Type and Rh 6200    Blood Product Expiration Date [id number]     Nm Gi Blood Loss  Result Date: 08/31/2018 CLINICAL DATA:  GI bleeding. EXAM: NUCLEAR MEDICINE GASTROINTESTINAL BLEEDING SCAN TECHNIQUE: Sequential abdominal images were obtained following intravenous administration of Tc-69m labeled red blood cells. RADIOPHARMACEUTICALS:  Twenty-two mCi Tc-66m  pertechnetate in-vitro labeled red cells. COMPARISON:  None. FINDINGS: No abnormal radiotracer activity is identified. Physiologic activity within bilateral kidneys and urinary bladder. IMPRESSION: No active GI bleeding identified. Electronically Signed   By: Fidela Salisbury M.D.   On: 08/31/2018 19:09    Discharge Exam:  Vitals:   09/20/18 0612 09/20/18 1311  BP: (!) 168/82  (!) 157/74  Pulse: 80 79  Resp: 20 16  Temp: 98.1 F (36.7 C) 97.8 F (36.6 C)  SpO2: 96% 97%    General: WN older WM who is alert and generally healthy appearing.  Lungs: Clear to auscultation and symmetric breath sounds. Heart:  RRR. No murmur or rub. Abdomen: Soft. Normal bowel sounds.  His incisions look good.  He has minimal discomfort.  Discharge Medications:   Allergies as of 09/20/2018      Reactions   Unasyn [ampicillin-sulbactam Sodium] Rash      Medication List    TAKE these medications   aspirin EC 81 MG tablet Take 1 tablet (81 mg total) by mouth daily for 30 days.   metoprolol succinate 50 MG 24 hr tablet Commonly known as: TOPROL-XL TAKE 1 TABLET EVERY DAY   multivitamin tablet Take 1 tablet by mouth daily.   ramipril 10 MG capsule Commonly known as: ALTACE Take 1 capsule (10 mg total) by mouth daily.   simvastatin 80 MG tablet Commonly known as: ZOCOR TAKE 1 TABLET EVERY DAY  AT  6PM What changed: See the new instructions.       Disposition: Discharge disposition: 01-Home or Self Care       Discharge Instructions    Diet - low sodium heart healthy   Complete by: As directed    Increase activity slowly   Complete by: As directed          Signed: Alphonsa Overall, M.D., [facility] Surgery Office:  [phone]  09/20/2018, 4:27 PM

## 2018-09-20 NOTE — Care Management Important Message (Signed)
Important Message  Patient Details IM Letter given to Servando Snare SW to present to the Patient Name: [name] MRN: [id number] Date of Birth: 1940/09/13   Medicare Important Message Given:  Yes     Kerin Salen 09/20/2018, 11:59 AM

## 2018-09-20 NOTE — Discharge Instructions (Signed)
[facility] - DISCHARGE INSTRUCTIONS TO PATIENT  Activity:  Driving - May drive in 3 or 4 days, if doing well.   Lifting - No lifting more than 15 pounds for 10 days, then no limit  Wound Care:   You may shower when you get home.  Wash wounds with soap and water.         Do not get in public water (lake, pool, etc) for 3 weeks  Diet:  As tolerated  Follow up appointment:  Call Dr. Pollie Friar office [facility]) at [phone] for an appointment in 2 to 3 weeks.   Medications and dosages:  Resume your home medications.             You may take tylenol, Aleve, or Advil for pain.  Call Dr. [name] or his office  [phone]) if you have:  Temperature greater than 100.4,  Persistent nausea and vomiting,  Severe uncontrolled pain,  Redness, tenderness, or signs of infection (pain, swelling, redness, odor or green/yellow discharge around the site),  Difficulty breathing, headache or visual disturbances,  Any other questions or concerns you may have after discharge.  In an emergency, call 911 or go to an Emergency Department at a nearby hospital.

## 2018-09-21 LAB — BPAM RBC
Blood Product Expiration Date: 202007242359
Unit Type and Rh: 6200

## 2018-09-21 LAB — TYPE AND SCREEN
ABO/RH(D): A POS
Antibody Screen: POSITIVE
Donor AG Type: NEGATIVE
Unit division: 0

## 2018-09-23 ENCOUNTER — Other Ambulatory Visit: Payer: Self-pay

## 2018-09-23 NOTE — Patient Outreach (Signed)
[facility]) Care Management  09/23/2018  [name] 09-Apr-1940 [id number]    EMMI-General Discharge RED ON EMMI ALERT Day # 1 Date: 09/22/2018 Red Alert Reason: "Scheduled follow up? No"   Outreach attempt # 1 to patient. Spoke with patient who denies any acute issues or concerns at this time. He voices things are going well. He is still a little weak but knows that is to be expected and will improve soon. Reviewed and addressed red alert with patient. He voices that he plans to call surgeon office today to make an appt. He confirms that he has contact info. Patient voice that he has been cleared to drive and will take himself to appts. He reports that there was no new meds added to his med regimen and he has no issues or concerns regarding his meds. He denies any further RN CM needs or concerns at this time. Advised patient that they would get one more automated EMMI-GENERAL post discharge calls to assess how they are doing following recent hospitalization and will receive a call from a nurse if any of their responses were abnormal. Patient voiced understanding and was appreciative of f/u call.      Plan: RN CM will close case as no further interventions needed at this time.  Enzo Montgomery, RN,BSN,CCM [place] Management Telephonic Care Management Coordinator Direct Phone: [phone] Toll Free: [phone] Fax: [phone]

## 2018-10-24 DIAGNOSIS — Z961 Presence of intraocular lens: Secondary | ICD-10-CM | POA: Diagnosis not present

## 2018-10-24 DIAGNOSIS — H353221 Exudative age-related macular degeneration, left eye, with active choroidal neovascularization: Secondary | ICD-10-CM | POA: Diagnosis not present

## 2018-10-24 DIAGNOSIS — Z9889 Other specified postprocedural states: Secondary | ICD-10-CM | POA: Diagnosis not present

## 2018-10-24 DIAGNOSIS — H353212 Exudative age-related macular degeneration, right eye, with inactive choroidal neovascularization: Secondary | ICD-10-CM | POA: Diagnosis not present

## 2018-10-24 DIAGNOSIS — H43813 Vitreous degeneration, bilateral: Secondary | ICD-10-CM | POA: Diagnosis not present

## 2018-11-28 ENCOUNTER — Other Ambulatory Visit: Payer: Self-pay | Admitting: Cardiology

## 2018-12-05 DIAGNOSIS — Z961 Presence of intraocular lens: Secondary | ICD-10-CM | POA: Diagnosis not present

## 2018-12-05 DIAGNOSIS — H353232 Exudative age-related macular degeneration, bilateral, with inactive choroidal neovascularization: Secondary | ICD-10-CM | POA: Diagnosis not present

## 2018-12-05 DIAGNOSIS — Z9889 Other specified postprocedural states: Secondary | ICD-10-CM | POA: Diagnosis not present

## 2018-12-05 DIAGNOSIS — H43813 Vitreous degeneration, bilateral: Secondary | ICD-10-CM | POA: Diagnosis not present

## 2018-12-11 ENCOUNTER — Other Ambulatory Visit: Payer: Self-pay | Admitting: Cardiology

## 2019-01-28 DIAGNOSIS — Z9049 Acquired absence of other specified parts of digestive tract: Secondary | ICD-10-CM | POA: Diagnosis not present

## 2019-01-28 DIAGNOSIS — H353232 Exudative age-related macular degeneration, bilateral, with inactive choroidal neovascularization: Secondary | ICD-10-CM | POA: Diagnosis not present

## 2019-01-28 DIAGNOSIS — I119 Hypertensive heart disease without heart failure: Secondary | ICD-10-CM | POA: Diagnosis not present

## 2019-01-28 DIAGNOSIS — I872 Venous insufficiency (chronic) (peripheral): Secondary | ICD-10-CM | POA: Diagnosis not present

## 2019-01-28 DIAGNOSIS — I2510 Atherosclerotic heart disease of native coronary artery without angina pectoris: Secondary | ICD-10-CM | POA: Diagnosis not present

## 2019-01-28 DIAGNOSIS — R7302 Impaired glucose tolerance (oral): Secondary | ICD-10-CM | POA: Diagnosis not present

## 2019-01-28 DIAGNOSIS — E7800 Pure hypercholesterolemia, unspecified: Secondary | ICD-10-CM | POA: Diagnosis not present

## 2019-01-28 DIAGNOSIS — Z23 Encounter for immunization: Secondary | ICD-10-CM | POA: Diagnosis not present

## 2019-01-28 DIAGNOSIS — E663 Overweight: Secondary | ICD-10-CM | POA: Diagnosis not present

## 2019-01-28 DIAGNOSIS — I679 Cerebrovascular disease, unspecified: Secondary | ICD-10-CM | POA: Diagnosis not present

## 2019-01-28 DIAGNOSIS — I252 Old myocardial infarction: Secondary | ICD-10-CM | POA: Diagnosis not present

## 2019-02-13 DIAGNOSIS — Z961 Presence of intraocular lens: Secondary | ICD-10-CM | POA: Diagnosis not present

## 2019-02-13 DIAGNOSIS — H353222 Exudative age-related macular degeneration, left eye, with inactive choroidal neovascularization: Secondary | ICD-10-CM | POA: Diagnosis not present

## 2019-02-13 DIAGNOSIS — Z9842 Cataract extraction status, left eye: Secondary | ICD-10-CM | POA: Diagnosis not present

## 2019-02-13 DIAGNOSIS — Z9841 Cataract extraction status, right eye: Secondary | ICD-10-CM | POA: Diagnosis not present

## 2019-02-13 DIAGNOSIS — H43813 Vitreous degeneration, bilateral: Secondary | ICD-10-CM | POA: Diagnosis not present

## 2019-02-13 DIAGNOSIS — H353211 Exudative age-related macular degeneration, right eye, with active choroidal neovascularization: Secondary | ICD-10-CM | POA: Diagnosis not present

## 2019-03-20 DIAGNOSIS — H353211 Exudative age-related macular degeneration, right eye, with active choroidal neovascularization: Secondary | ICD-10-CM | POA: Diagnosis not present

## 2019-03-20 DIAGNOSIS — H43813 Vitreous degeneration, bilateral: Secondary | ICD-10-CM | POA: Diagnosis not present

## 2019-03-20 DIAGNOSIS — Z9842 Cataract extraction status, left eye: Secondary | ICD-10-CM | POA: Diagnosis not present

## 2019-03-20 DIAGNOSIS — Z9841 Cataract extraction status, right eye: Secondary | ICD-10-CM | POA: Diagnosis not present

## 2019-03-20 DIAGNOSIS — Z961 Presence of intraocular lens: Secondary | ICD-10-CM | POA: Diagnosis not present

## 2019-03-20 DIAGNOSIS — H353222 Exudative age-related macular degeneration, left eye, with inactive choroidal neovascularization: Secondary | ICD-10-CM | POA: Diagnosis not present

## 2019-04-07 NOTE — Progress Notes (Signed)
[name] Date of Birth: 19-Sep-1940 Medical Record [id number]  History of Present Illness: [name] is seen today for followup CAD. He has a known history of coronary disease with a myocardial infarction in 2007. Cardiac catheterization at that time demonstrated occlusion of the mid left circumflex coronary. He had a 50-70% stenosis in the mid LAD that was eccentric. He had a Myoview study in January 2018 that showed a defect in the inferior lateral wall. This was unchanged compared to prior studies.   He has a history of a "mini stroke"  November 2015 with swallowing difficulty. MRI showed no acute change but there were chronic ischemic changes in the right parietal lobe that were new since 2013. He was switched from ASA to Plavix.   In July 2020 he underwent left hemicolectomy for a colon mass that was benign.   On follow up today he is doing very well. He denies any chest pain or SOB. He continues to walk regularly.  BP readings at home have been normal. No palpitations or edema.  Current Outpatient Medications on File Prior to Visit  Medication Sig Dispense Refill  . clopidogrel (PLAVIX) 75 MG tablet     . FEROSUL 325 (65 Fe) MG tablet     . metoprolol succinate (TOPROL-XL) 50 MG 24 hr tablet TAKE 1 TABLET EVERY DAY 90 tablet 1  . Multiple Vitamin (MULTIVITAMIN) tablet Take 1 tablet by mouth daily.    . ramipril (ALTACE) 10 MG capsule TAKE 1 CAPSULE EVERY DAY 90 capsule 3  . simvastatin (ZOCOR) 80 MG tablet TAKE 1 TABLET EVERY DAY AT 6PM 90 tablet 1   No current facility-administered medications on file prior to visit.    Allergies  Allergen Reactions  . Unasyn [Ampicillin-Sulbactam Sodium] Rash    Past Medical History:  Diagnosis Date  . Acute cholecystitis 02/14/2012  . Anginal pain ([place])   . CAD (coronary artery disease) 2007   occlusion of mid LCX, 50-70% LAD  . Cataract 2010   bilateral eyes  . Choledocholithiasis 02/14/2012  . Dyslipidemia   . HTN  (hypertension)   . Macular degeneration   . Myocardial infarction ([place]) 1/07   Acute non-ST-elevation myocardial infarction  . Overweight(278.02)    with body mass index of 29  . TIA (transient ischemic attack)    PER PATIENT , HIS EYE DOCTOR SAW ABNORMALILTY DURING EYE EXAM . TOLD HIM HE HAD A SMALL STROKE  AND THAT WAS THE CAUSE OF A C/O DYSPHAGIA   AT THE TIME, NO RECURRENCE  SINCE THAT TIME, DYSPHAGIA  NOW SINCE IMPROVED  WITH AVOIDANCE OF STRIGNY FOODS     Past Surgical History:  Procedure Laterality Date  . CARDIAC CATHETERIZATION  03/17/2005   Ejection fraction is estimated at 55%  . CATARACT EXTRACTION W/ INTRAOCULAR LENS  IMPLANT, BILATERAL  ~ 2010  . CHOLECYSTECTOMY  02/15/2012   Procedure: LAPAROSCOPIC CHOLECYSTECTOMY WITH INTRAOPERATIVE CHOLANGIOGRAM;  Surgeon: Stark Klein, MD;  Location: [facility];  Service: General;  Laterality: N/A;  . KNEE SURGERY  1980's   "took knee out and put it back in" ; left   . LAPAROSCOPIC SIGMOID COLECTOMY Left 09/17/2018   Procedure: LAPAROSCOPIC LEFT HEMICOLECTOMY WITH MOBILIZATION OF SPLENNIC FLEXURE;  Surgeon: Alphonsa Overall, MD;  Location: [facility];  Service: General;  Laterality: Left;    Social History   Tobacco Use  Smoking Status Never Smoker  Smokeless Tobacco Never Used    Social History   Substance and Sexual Activity  Alcohol Use No   Comment: 12/05/2011 "stopped all alcohol > 25 years ago; never drank heavily"    Family History  Problem Relation Age of Onset  . Heart disease Father   . Cancer Mother        breast  . Stomach cancer Mother   . Cancer Maternal Aunt        breast  . Esophageal cancer Neg Hx   . Rectal cancer Neg Hx   . Colon cancer Neg Hx     Review of Systems: As noted in history of present illness.  All other systems were reviewed and are negative.  Physical Exam: BP 130/88   Pulse 75   Ht 6\' 2"  (1.88 m)   Wt 213 lb 3.2 oz (96.7 kg)   BMI 27.37 kg/m  GENERAL:  Well appearing WM in NAD HEENT:   PERRL, EOMI, sclera are clear. Oropharynx is clear. NECK:  No jugular venous distention, carotid upstroke brisk and symmetric, no bruits, no thyromegaly or adenopathy LUNGS:  Clear to auscultation bilaterally CHEST:  Unremarkable HEART:  RRR,  PMI not displaced or sustained,S1 and S2 within normal limits, no S3, no S4: no clicks, no rubs, no murmurs ABD:  Soft, nontender. BS +, no masses or bruits. No hepatomegaly, no splenomegaly EXT:  2 + pulses throughout, no edema, no cyanosis no clubbing SKIN:  Warm and dry.  No rashes NEURO:  Alert and oriented x 3. Cranial nerves II through XII intact. PSYCH:  Cognitively intact    LABORATORY DATA: Ecg: today- NSR with normal Ecg. Rate 75. Normal.  I have personally reviewed and interpreted this study.  Labs dated 07/02/15: Cholesterol 122, triglycerides 101, HDL 35, LDL 67 Dated 01/12/16: A1c 5.2%, CMET and CBC normal. Dated 07/06/16: cholesterol 120, triglycerides 78, HDL 40, LDL 64. Hgb  16.2 Dated 01/23/17: A1c 5.4%. Normal chemistries. Dated 02/04/18: cholesterol 126, triglycerides 117, HDL 42, LDL 61. A1c 5.5%. Creatinine 1.1. Hgb and ALT normal Dated 07/19/18: cholesterol 121, triglycerides 92, HDL 35, LDL 68 Dated 01/28/19: Hgb 10.5. CMET normal. A1c 5.7%.   Myoview 04/19/16: Study Highlights    The left ventricular ejection fraction is normal (55-65%).  Nuclear stress EF: 59%.  There was no ST segment deviation noted during stress.  No T wave inversion was noted during stress.  Blood pressure demonstrated a normal response to exercise.  Defect 1: There is a small defect of severe severity present in the basal inferolateral and mid inferolateral location.  Findings consistent with prior myocardial infarction with peri-infarct ischemia.  This is an intermediate risk study.    Assessment / Plan:  1. Coronary disease with history of occlusion of the mid left circumflex coronary in 2007. He is asymptomatic. Myoview study in June of  2018 was unchanged from prior studies. Continue current therapy with Plavix, statin, and beta blocker therapy.   2. Hypertension-well controlled.  Continue ACE inhibitor and beta blocker therapy.  3. Dyslipidemia patient is on high-dose statin therapy. Well controlled.   4. CVA  on Plavx. No recurrence  Follow up in one year.

## 2019-04-14 ENCOUNTER — Ambulatory Visit (INDEPENDENT_AMBULATORY_CARE_PROVIDER_SITE_OTHER): Payer: Medicare Other | Admitting: Cardiology

## 2019-04-14 ENCOUNTER — Encounter: Payer: Self-pay | Admitting: Cardiology

## 2019-04-14 VITALS — BP 130/88 | HR 75 | Ht 74.0 in | Wt 213.2 lb

## 2019-04-14 DIAGNOSIS — I2510 Atherosclerotic heart disease of native coronary artery without angina pectoris: Secondary | ICD-10-CM

## 2019-04-14 DIAGNOSIS — E785 Hyperlipidemia, unspecified: Secondary | ICD-10-CM | POA: Diagnosis not present

## 2019-04-14 DIAGNOSIS — I10 Essential (primary) hypertension: Secondary | ICD-10-CM | POA: Diagnosis not present

## 2019-04-14 NOTE — Addendum Note (Signed)
Addended by: Kathyrn Lass on: 04/14/2019 08:48 AM   Modules accepted: Orders

## 2019-04-16 ENCOUNTER — Other Ambulatory Visit: Payer: Self-pay | Admitting: Cardiology

## 2019-04-17 NOTE — Telephone Encounter (Signed)
Rx has been sent to the pharmacy electronically. ° °

## 2019-05-29 DIAGNOSIS — H43813 Vitreous degeneration, bilateral: Secondary | ICD-10-CM | POA: Diagnosis not present

## 2019-05-29 DIAGNOSIS — H353211 Exudative age-related macular degeneration, right eye, with active choroidal neovascularization: Secondary | ICD-10-CM | POA: Diagnosis not present

## 2019-05-29 DIAGNOSIS — H353222 Exudative age-related macular degeneration, left eye, with inactive choroidal neovascularization: Secondary | ICD-10-CM | POA: Diagnosis not present

## 2019-05-29 DIAGNOSIS — Z961 Presence of intraocular lens: Secondary | ICD-10-CM | POA: Diagnosis not present

## 2019-07-23 DIAGNOSIS — Z125 Encounter for screening for malignant neoplasm of prostate: Secondary | ICD-10-CM | POA: Diagnosis not present

## 2019-07-23 DIAGNOSIS — E7800 Pure hypercholesterolemia, unspecified: Secondary | ICD-10-CM | POA: Diagnosis not present

## 2019-07-23 DIAGNOSIS — R7302 Impaired glucose tolerance (oral): Secondary | ICD-10-CM | POA: Diagnosis not present

## 2019-07-30 DIAGNOSIS — I2510 Atherosclerotic heart disease of native coronary artery without angina pectoris: Secondary | ICD-10-CM | POA: Diagnosis not present

## 2019-07-30 DIAGNOSIS — Z1339 Encounter for screening examination for other mental health and behavioral disorders: Secondary | ICD-10-CM | POA: Diagnosis not present

## 2019-07-30 DIAGNOSIS — Z9049 Acquired absence of other specified parts of digestive tract: Secondary | ICD-10-CM | POA: Diagnosis not present

## 2019-07-30 DIAGNOSIS — R7302 Impaired glucose tolerance (oral): Secondary | ICD-10-CM | POA: Diagnosis not present

## 2019-07-30 DIAGNOSIS — Z0000 Encounter for general adult medical examination without abnormal findings: Secondary | ICD-10-CM | POA: Diagnosis not present

## 2019-07-30 DIAGNOSIS — Z1331 Encounter for screening for depression: Secondary | ICD-10-CM | POA: Diagnosis not present

## 2019-07-30 DIAGNOSIS — R82998 Other abnormal findings in urine: Secondary | ICD-10-CM | POA: Diagnosis not present

## 2019-07-30 DIAGNOSIS — D126 Benign neoplasm of colon, unspecified: Secondary | ICD-10-CM | POA: Diagnosis not present

## 2019-07-30 DIAGNOSIS — I252 Old myocardial infarction: Secondary | ICD-10-CM | POA: Diagnosis not present

## 2019-07-30 DIAGNOSIS — I1310 Hypertensive heart and chronic kidney disease without heart failure, with stage 1 through stage 4 chronic kidney disease, or unspecified chronic kidney disease: Secondary | ICD-10-CM | POA: Diagnosis not present

## 2019-07-30 DIAGNOSIS — I679 Cerebrovascular disease, unspecified: Secondary | ICD-10-CM | POA: Diagnosis not present

## 2019-07-30 DIAGNOSIS — E7800 Pure hypercholesterolemia, unspecified: Secondary | ICD-10-CM | POA: Diagnosis not present

## 2019-07-30 DIAGNOSIS — H353232 Exudative age-related macular degeneration, bilateral, with inactive choroidal neovascularization: Secondary | ICD-10-CM | POA: Diagnosis not present

## 2019-07-30 DIAGNOSIS — E663 Overweight: Secondary | ICD-10-CM | POA: Diagnosis not present

## 2019-07-30 DIAGNOSIS — N1831 Chronic kidney disease, stage 3a: Secondary | ICD-10-CM | POA: Diagnosis not present

## 2019-08-12 ENCOUNTER — Ambulatory Visit (INDEPENDENT_AMBULATORY_CARE_PROVIDER_SITE_OTHER): Payer: Medicare Other | Admitting: Internal Medicine

## 2019-08-12 ENCOUNTER — Telehealth: Payer: Self-pay

## 2019-08-12 ENCOUNTER — Encounter: Payer: Self-pay | Admitting: Internal Medicine

## 2019-08-12 VITALS — BP 122/68 | HR 72 | Ht 74.0 in | Wt 211.6 lb

## 2019-08-12 DIAGNOSIS — I2510 Atherosclerotic heart disease of native coronary artery without angina pectoris: Secondary | ICD-10-CM

## 2019-08-12 DIAGNOSIS — Z7902 Long term (current) use of antithrombotics/antiplatelets: Secondary | ICD-10-CM | POA: Diagnosis not present

## 2019-08-12 DIAGNOSIS — K6389 Other specified diseases of intestine: Secondary | ICD-10-CM

## 2019-08-12 DIAGNOSIS — Z86010 Personal history of colonic polyps: Secondary | ICD-10-CM

## 2019-08-12 MED ORDER — PLENVU 140 G PO SOLR: 1.0000 | Freq: Once | ORAL | 0 refills | Status: AC

## 2019-08-12 NOTE — Patient Instructions (Signed)
You have been scheduled for a colonoscopy. Please follow written instructions given to you at your visit today.  Please pick up your prep supplies at the pharmacy within the next 1-3 days. If you use inhalers (even only as needed), please bring them with you on the day of your procedure.   

## 2019-08-12 NOTE — Progress Notes (Signed)
HISTORY OF PRESENT ILLNESS:  [name] is a 79 y.o. 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  He presents today regarding surveillance colonoscopy.  Was last seen in this office May 29, 2018 regarding rectal bleeding and Hemoccult positive stool.  He subsequently underwent colonoscopy on Plavix June 20, 2018.  Diminutive colon polyp at the ileocecal valve was removed and found to be tubular adenoma.  A circular mass in the descending colon measuring approximately 2 cm was felt to be malignant.  Biopsies revealed traditional serrated adenoma.  He had some bleeding post biopsy and was hospitalized overnight with observation.  He subsequently underwent laparoscopic left colectomy.  The pathology revealed traditional sessile serrated adenoma but no cancer.  He is back today for follow-up.  He has done well since his surgery.  No hospitalizations.  He continues on his medications.  He did come off his Plavix for his surgery.  He has completed his Covid vaccination series  REVIEW OF SYSTEMS:  All non-GI ROS negative unless otherwise stated in the HPI.  Past Medical History:  Diagnosis Date  . Acute cholecystitis 02/14/2012  . Anginal pain ([place])   . CAD (coronary artery disease) 2007   occlusion of mid LCX, 50-70% LAD  . Cataract 2010   bilateral eyes  . Choledocholithiasis 02/14/2012  . Dyslipidemia   . HTN (hypertension)   . Macular degeneration   . Myocardial infarction ([place]) 1/07   Acute non-ST-elevation myocardial infarction  . Overweight(278.02)    with body mass index of 29  . TIA (transient ischemic attack)    PER PATIENT , HIS EYE DOCTOR SAW ABNORMALILTY DURING EYE EXAM . TOLD HIM HE HAD A SMALL STROKE  AND THAT WAS THE CAUSE OF A C/O DYSPHAGIA   AT THE TIME, NO RECURRENCE  SINCE THAT TIME, DYSPHAGIA  NOW SINCE IMPROVED  WITH AVOIDANCE OF STRIGNY  FOODS     Past Surgical History:  Procedure Laterality Date  . CARDIAC CATHETERIZATION  03/17/2005   Ejection fraction is estimated at 55%  . CATARACT EXTRACTION W/ INTRAOCULAR LENS  IMPLANT, BILATERAL  ~ 2010  . CHOLECYSTECTOMY  02/15/2012   Procedure: LAPAROSCOPIC CHOLECYSTECTOMY WITH INTRAOPERATIVE CHOLANGIOGRAM;  Surgeon: Stark Klein, MD;  Location: [facility];  Service: General;  Laterality: N/A;  . KNEE SURGERY  1980's   "took knee out and put it back in" ; left   . LAPAROSCOPIC SIGMOID COLECTOMY Left 09/17/2018   Procedure: LAPAROSCOPIC LEFT HEMICOLECTOMY WITH MOBILIZATION OF SPLENNIC FLEXURE;  Surgeon: Alphonsa Overall, MD;  Location[facility];  Service: General;  Laterality: Left;    Social History DEKENDRICK SLIGH  reports that he has never smoked. He has never used smokeless tobacco. He reports that he does not drink alcohol or use drugs.  family history includes Cancer in his maternal aunt and mother; Heart disease in his father; Stomach cancer in his mother.  Allergies  Allergen Reactions  . Unasyn [Ampicillin-Sulbactam Sodium] Rash       PHYSICAL EXAMINATION: Vital signs: BP 122/68   Pulse 72   Ht 6\' 2"  (1.88 m)   Wt 211 lb 9.6 oz (96 kg)   BMI 27.17 kg/m   Constitutional: generally well-appearing, no acute distress Psychiatric: alert and oriented x3, cooperative Eyes: extraocular movements intact, anicteric, conjunctiva pink Mouth: oral pharynx moist, no lesions Neck: supple no lymphadenopathy Cardiovascular: heart regular rate and rhythm,  no murmur Lungs: clear to auscultation bilaterally Abdomen: soft, nontender, nondistended, no obvious ascites, no peritoneal signs, normal bowel sounds, no organomegaly Rectal: Deferred until colonoscopy Extremities: no clubbing, cyanosis, or lower extremity edema bilaterally Skin: no lesions on visible extremities Neuro: No focal deficits.  Cranial nerves intact  ASSESSMENT:  1.  History of large traditional sessile serrated  adenoma left colon status post resection.  As well history of adenomatous colon polyp.  Presents today for follow-up colonoscopy.  Appropriate candidate without contraindication.  I recommended performing his exam on Plavix.  However, the patient is pleasantly and reasonably adamant about having the exam performed off Plavix as he did with his left hemicolectomy.  He is concerned about possible bleeding complications.   PLAN:  1.  Colonoscopy.  The patient is HIGH RISK given his comorbidities and the need to hold Plavix.  This will be performed on Plavix per his request.  He does understands and accepts the possible risk of stroke or cardiac events.The nature of the procedure, as well as the risks, benefits, and alternatives were carefully and thoroughly reviewed with the patient. Ample time for discussion and questions allowed. The patient understood, was satisfied, and agreed to proceed.

## 2019-08-12 NOTE — Telephone Encounter (Signed)
  08/12/2019   RE: [name] DOB: 1940-09-06 MRN: [id number]   Dear Dr Osborne Casco,    We have scheduled the above patient for an endoscopic procedure. Our records show that he is on anticoagulation therapy.   Please advise as to how long the patient may come off his therapy of Plavix prior to the procedure, which is scheduled for 09/18/2019.  Please fax back/ or route the completed form to [facility] at [phone].   Sincerely,    Phillis Haggis

## 2019-08-25 NOTE — Telephone Encounter (Signed)
Spoke with patient and told him that per Dr. Osborne Casco he could hold his Plavix for five days prior to his procedure.  He also mentioned his prep was too expensive so I told him I would leave a Plenvu sample up front for him to pick up.  Patient agreed

## 2019-09-16 ENCOUNTER — Other Ambulatory Visit: Payer: Self-pay | Admitting: Cardiology

## 2019-09-18 ENCOUNTER — Encounter: Payer: Self-pay | Admitting: Internal Medicine

## 2019-09-18 ENCOUNTER — Other Ambulatory Visit: Payer: Self-pay

## 2019-09-18 ENCOUNTER — Ambulatory Visit (AMBULATORY_SURGERY_CENTER): Payer: Medicare Other | Admitting: Internal Medicine

## 2019-09-18 VITALS — BP 132/63 | HR 68 | Temp 97.0°F | Resp 13 | Ht 74.0 in | Wt 211.0 lb

## 2019-09-18 DIAGNOSIS — K639 Disease of intestine, unspecified: Secondary | ICD-10-CM

## 2019-09-18 DIAGNOSIS — Z86010 Personal history of colonic polyps: Secondary | ICD-10-CM | POA: Diagnosis present

## 2019-09-18 DIAGNOSIS — K5289 Other specified noninfective gastroenteritis and colitis: Secondary | ICD-10-CM

## 2019-09-18 DIAGNOSIS — D122 Benign neoplasm of ascending colon: Secondary | ICD-10-CM

## 2019-09-18 DIAGNOSIS — K5730 Diverticulosis of large intestine without perforation or abscess without bleeding: Secondary | ICD-10-CM

## 2019-09-18 DIAGNOSIS — K9189 Other postprocedural complications and disorders of digestive system: Secondary | ICD-10-CM | POA: Diagnosis not present

## 2019-09-18 MED ORDER — SODIUM CHLORIDE 0.9 % IV SOLN
500.0000 mL | Freq: Once | INTRAVENOUS | Status: DC
Start: 2019-09-18 — End: 2019-09-18

## 2019-09-18 NOTE — Progress Notes (Signed)
Called to room to assist during endoscopic procedure.  Patient ID and intended procedure confirmed with present staff. Received instructions for my participation in the procedure from the performing physician.  

## 2019-09-18 NOTE — Op Note (Signed)
[facility] Patient Name: [name] Procedure Date: 09/18/2019 2:27 PM MRN: [id number] Endoscopist: Docia Chuck. Henrene Pastor , MD Age: 79 Referring MD:  Date of Birth: Mar 15, 1940 Gender: Male Account #: [id number] Procedure:                Colonoscopy with cold snare polypectomy; with                            biopsies Indications:              High risk colon cancer surveillance: Personal                            history of traditional serrated adenoma of the                            colon. April 2020 large lesion requiring surgical                            resection. Medicines:                Monitored Anesthesia Care Procedure:                Pre-Anesthesia Assessment:                           - Prior to the procedure, a History and Physical                            was performed, and patient medications and                            allergies were reviewed. The patient's tolerance of                            previous anesthesia was also reviewed. The risks                            and benefits of the procedure and the sedation                            options and risks were discussed with the patient.                            All questions were answered, and informed consent                            was obtained. Prior Anticoagulants: The patient has                            taken Plavix (clopidogrel), last dose was 7 days                            prior to procedure. ASA Grade Assessment: III - A  patient with severe systemic disease. After                            reviewing the risks and benefits, the patient was                            deemed in satisfactory condition to undergo the                            procedure.                           After obtaining informed consent, the colonoscope                            was passed under direct vision. Throughout the                            procedure, the patient's  blood pressure, pulse, and                            oxygen saturations were monitored continuously. The                            Colonoscope was introduced through the anus and                            advanced to the the cecum, identified by                            appendiceal orifice and ileocecal valve. The                            ileocecal valve, appendiceal orifice, and rectum                            were photographed. The quality of the bowel                            preparation was good. The colonoscopy was performed                            without difficulty. The patient tolerated the                            procedure well. The bowel preparation used was                            SUPREP via split dose instruction. Scope In: 2:37:02 PM Scope Out: 2:55:04 PM Scope Withdrawal Time: 0 hours 14 minutes 22 seconds  Total Procedure Duration: 0 hours 18 minutes 2 seconds  Findings:                 A 7 mm polyp was found in the ascending colon. The  polyp was sessile. The polyp was removed with a                            cold snare. Resection and retrieval were complete.                           Many small and large-mouthed diverticula were found                            in the entire colon. The surgical anastomosis was                            located at approximately 40 cm. Multiple sutures.                            One area surrounding 1 suture was somewhat                            edematous and erythematous. This was biopsied                           The entire examined colon appeared otherwise normal                            on direct and retroflexion views. Complications:            No immediate complications. Estimated blood loss:                            None. Estimated Blood Loss:     Estimated blood loss: none. Impression:               - One 7 mm polyp in the ascending colon, removed                             with a cold snare. Resected and retrieved.                           - Diverticulosis in the entire examined colon.                           - Surgical anastomosis at 40 cm. Biopsied                           - The entire examined colon is normal on direct and                            retroflexion views. Recommendation:           - Repeat colonoscopy is not recommended for                            surveillance.                           - Resume Plavix (  clopidogrel) today at prior dose.                           - Patient has a contact number available for                            emergencies. The signs and symptoms of potential                            delayed complications were discussed with the                            patient. Return to normal activities tomorrow.                            Written discharge instructions were provided to the                            patient.                           - Resume previous diet.                           - Continue present medications.                           - Await pathology results. Docia Chuck. Henrene Pastor, MD 09/18/2019 3:13:23 PM This report has been signed electronically.

## 2019-09-18 NOTE — Progress Notes (Signed)
A/ox3, pleased with MAC, report to RN 

## 2019-09-18 NOTE — Patient Instructions (Signed)
Handouts on polyps and diverticulosis given to you today  Await pathology results  Resume plavix today    YOU HAD AN ENDOSCOPIC PROCEDURE TODAY AT [facility]:   Refer to the procedure report that was given to you for any specific questions about what was found during the examination.  If the procedure report does not answer your questions, please call your gastroenterologist to clarify.  If you requested that your care partner not be given the details of your procedure findings, then the procedure report has been included in a sealed envelope for you to review at your convenience later.  YOU SHOULD EXPECT: Some feelings of bloating in the abdomen. Passage of more gas than usual.  Walking can help get rid of the air that was put into your GI tract during the procedure and reduce the bloating. If you had a lower endoscopy (such as a colonoscopy or flexible sigmoidoscopy) you may notice spotting of blood in your stool or on the toilet paper. If you underwent a bowel prep for your procedure, you may not have a normal bowel movement for a few days.  Please Note:  You might notice some irritation and congestion in your nose or some drainage.  This is from the oxygen used during your procedure.  There is no need for concern and it should clear up in a day or so.  SYMPTOMS TO REPORT IMMEDIATELY:   Following lower endoscopy (colonoscopy or flexible sigmoidoscopy):  Excessive amounts of blood in the stool  Significant tenderness or worsening of abdominal pains  Swelling of the abdomen that is new, acute  Fever of 100F or higher  For urgent or emergent issues, a gastroenterologist can be reached at any hour by calling [phone]. Do not use MyChart messaging for urgent concerns.    DIET:  We do recommend a small meal at first, but then you may proceed to your regular diet.  Drink plenty of fluids but you should avoid alcoholic beverages for 24 hours.  ACTIVITY:  You should plan  to take it easy for the rest of today and you should NOT DRIVE or use heavy machinery until tomorrow (because of the sedation medicines used during the test).    FOLLOW UP: Our staff will call the number listed on your records 48-72 hours following your procedure to check on you and address any questions or concerns that you may have regarding the information given to you following your procedure. If we do not reach you, we will leave a message.  We will attempt to reach you two times.  During this call, we will ask if you have developed any symptoms of COVID 19. If you develop any symptoms (ie: fever, flu-like symptoms, shortness of breath, cough etc.) before then, please call [phone].  If you test positive for Covid 19 in the 2 weeks post procedure, please call and report this information to [place].    If any biopsies were taken you will be contacted by phone or by letter within the next 1-3 weeks.  Please call us at [phone] if you have not heard about the biopsies in 3 weeks.    SIGNATURES/CONFIDENTIALITY: You and/or your care partner have signed paperwork which will be entered into your electronic medical record.  These signatures attest to the fact that that the information above on your After Visit Summary has been reviewed and is understood.  Full responsibility of the confidentiality of this discharge information lies with you and/or your  care-partner.

## 2019-09-18 NOTE — Progress Notes (Signed)
V/s-cw  Check-in-jb 

## 2019-09-22 ENCOUNTER — Telehealth: Payer: Self-pay | Admitting: *Deleted

## 2019-09-22 ENCOUNTER — Telehealth: Payer: Self-pay

## 2019-09-22 NOTE — Telephone Encounter (Signed)
Attempted f/u phone call. No answer. Left message. °

## 2019-09-22 NOTE — Telephone Encounter (Signed)
  Follow up Call-  Call back number 09/18/2019 06/20/2018  Post procedure Call Back phone  # [phone]  Permission to leave phone message Yes Yes  Some recent data might be hidden     Patient questions:  Do you have a fever, pain , or abdominal swelling? No. Pain Score  0 *  Have you tolerated food without any problems? Yes.    Have you been able to return to your normal activities? Yes.    Do you have any questions about your discharge instructions: Diet   No. Medications  No. Follow up visit  No.  Do you have questions or concerns about your Care? No.  Actions: * If pain score is 4 or above: No action needed, pain <4.  1. Have you developed a fever since your procedure? no  2.   Have you had an respiratory symptoms (SOB or cough) since your procedure? no  3.   Have you tested positive for COVID 19 since your procedure no  4.   Have you had any family members/close contacts diagnosed with the COVID 19 since your procedure?  no   If yes to any of these questions please route to Joylene John, RN and Erenest Rasher, RN

## 2019-09-26 ENCOUNTER — Encounter: Payer: Self-pay | Admitting: Internal Medicine

## 2019-10-20 DIAGNOSIS — H3554 Dystrophies primarily involving the retinal pigment epithelium: Secondary | ICD-10-CM | POA: Diagnosis not present

## 2019-10-20 DIAGNOSIS — H353212 Exudative age-related macular degeneration, right eye, with inactive choroidal neovascularization: Secondary | ICD-10-CM | POA: Diagnosis not present

## 2019-10-20 DIAGNOSIS — Z961 Presence of intraocular lens: Secondary | ICD-10-CM | POA: Diagnosis not present

## 2019-10-20 DIAGNOSIS — H43813 Vitreous degeneration, bilateral: Secondary | ICD-10-CM | POA: Diagnosis not present

## 2019-10-20 DIAGNOSIS — H353222 Exudative age-related macular degeneration, left eye, with inactive choroidal neovascularization: Secondary | ICD-10-CM | POA: Diagnosis not present

## 2019-10-20 DIAGNOSIS — H353232 Exudative age-related macular degeneration, bilateral, with inactive choroidal neovascularization: Secondary | ICD-10-CM | POA: Diagnosis not present

## 2019-11-26 DIAGNOSIS — H0102A Squamous blepharitis right eye, upper and lower eyelids: Secondary | ICD-10-CM | POA: Diagnosis not present

## 2019-11-26 DIAGNOSIS — Z961 Presence of intraocular lens: Secondary | ICD-10-CM | POA: Diagnosis not present

## 2019-11-26 DIAGNOSIS — H353232 Exudative age-related macular degeneration, bilateral, with inactive choroidal neovascularization: Secondary | ICD-10-CM | POA: Diagnosis not present

## 2019-11-26 DIAGNOSIS — H0102B Squamous blepharitis left eye, upper and lower eyelids: Secondary | ICD-10-CM | POA: Diagnosis not present

## 2019-11-26 DIAGNOSIS — H18513 Endothelial corneal dystrophy, bilateral: Secondary | ICD-10-CM | POA: Diagnosis not present

## 2019-12-15 DIAGNOSIS — Z23 Encounter for immunization: Secondary | ICD-10-CM | POA: Diagnosis not present

## 2020-02-02 DIAGNOSIS — H353232 Exudative age-related macular degeneration, bilateral, with inactive choroidal neovascularization: Secondary | ICD-10-CM | POA: Diagnosis not present

## 2020-02-02 DIAGNOSIS — R7302 Impaired glucose tolerance (oral): Secondary | ICD-10-CM | POA: Diagnosis not present

## 2020-02-02 DIAGNOSIS — H35362 Drusen (degenerative) of macula, left eye: Secondary | ICD-10-CM | POA: Diagnosis not present

## 2020-02-02 DIAGNOSIS — H3554 Dystrophies primarily involving the retinal pigment epithelium: Secondary | ICD-10-CM | POA: Diagnosis not present

## 2020-02-02 DIAGNOSIS — I1310 Hypertensive heart and chronic kidney disease without heart failure, with stage 1 through stage 4 chronic kidney disease, or unspecified chronic kidney disease: Secondary | ICD-10-CM | POA: Diagnosis not present

## 2020-02-02 DIAGNOSIS — E663 Overweight: Secondary | ICD-10-CM | POA: Diagnosis not present

## 2020-02-02 DIAGNOSIS — I872 Venous insufficiency (chronic) (peripheral): Secondary | ICD-10-CM | POA: Diagnosis not present

## 2020-02-02 DIAGNOSIS — H353222 Exudative age-related macular degeneration, left eye, with inactive choroidal neovascularization: Secondary | ICD-10-CM | POA: Diagnosis not present

## 2020-02-02 DIAGNOSIS — I679 Cerebrovascular disease, unspecified: Secondary | ICD-10-CM | POA: Diagnosis not present

## 2020-02-02 DIAGNOSIS — E7800 Pure hypercholesterolemia, unspecified: Secondary | ICD-10-CM | POA: Diagnosis not present

## 2020-02-02 DIAGNOSIS — H18513 Endothelial corneal dystrophy, bilateral: Secondary | ICD-10-CM | POA: Diagnosis not present

## 2020-02-02 DIAGNOSIS — N1831 Chronic kidney disease, stage 3a: Secondary | ICD-10-CM | POA: Diagnosis not present

## 2020-02-02 DIAGNOSIS — H353212 Exudative age-related macular degeneration, right eye, with inactive choroidal neovascularization: Secondary | ICD-10-CM | POA: Diagnosis not present

## 2020-02-02 DIAGNOSIS — I2510 Atherosclerotic heart disease of native coronary artery without angina pectoris: Secondary | ICD-10-CM | POA: Diagnosis not present

## 2020-02-02 DIAGNOSIS — H35373 Puckering of macula, bilateral: Secondary | ICD-10-CM | POA: Diagnosis not present

## 2020-02-02 DIAGNOSIS — H43813 Vitreous degeneration, bilateral: Secondary | ICD-10-CM | POA: Diagnosis not present

## 2020-02-02 DIAGNOSIS — I119 Hypertensive heart disease without heart failure: Secondary | ICD-10-CM | POA: Diagnosis not present

## 2020-02-02 DIAGNOSIS — Z961 Presence of intraocular lens: Secondary | ICD-10-CM | POA: Diagnosis not present

## 2020-02-02 DIAGNOSIS — I252 Old myocardial infarction: Secondary | ICD-10-CM | POA: Diagnosis not present

## 2020-02-02 DIAGNOSIS — Z23 Encounter for immunization: Secondary | ICD-10-CM | POA: Diagnosis not present

## 2020-02-02 DIAGNOSIS — J309 Allergic rhinitis, unspecified: Secondary | ICD-10-CM | POA: Diagnosis not present

## 2020-03-15 DIAGNOSIS — H353232 Exudative age-related macular degeneration, bilateral, with inactive choroidal neovascularization: Secondary | ICD-10-CM | POA: Diagnosis not present

## 2020-03-15 DIAGNOSIS — H353212 Exudative age-related macular degeneration, right eye, with inactive choroidal neovascularization: Secondary | ICD-10-CM | POA: Diagnosis not present

## 2020-03-15 DIAGNOSIS — H353222 Exudative age-related macular degeneration, left eye, with inactive choroidal neovascularization: Secondary | ICD-10-CM | POA: Diagnosis not present

## 2020-03-15 DIAGNOSIS — H3554 Dystrophies primarily involving the retinal pigment epithelium: Secondary | ICD-10-CM | POA: Diagnosis not present

## 2020-04-12 NOTE — Progress Notes (Signed)
[name] Date of Birth: 08-10-1940 Medical Record #[id number]  History of Present Illness: Mr. Tappan is seen today for followup CAD. He has a known history of coronary disease with a myocardial infarction in 2007. Cardiac catheterization at that time demonstrated occlusion of the mid left circumflex coronary. He had a 50-70% stenosis in the mid LAD that was eccentric. He had a Myoview study in January 2018 that showed a defect in the inferior lateral wall. This was unchanged compared to prior studies.   He has a history of a "mini stroke"  November 2015 with swallowing difficulty. MRI showed no acute change but there were chronic ischemic changes in the right parietal lobe that were new since 2013. He was switched from ASA to Plavix.   In July 2020 he underwent left hemicolectomy for a colon mass that was benign.   On follow up today he is doing very well. He denies any chest pain or SOB. He continues to walk regularly 3-4 x/week for 2-3 miles.  BP readings at home have been normal - typically 130/70. No palpitations or edema.  Current Outpatient Medications on File Prior to Visit  Medication Sig Dispense Refill   clopidogrel (PLAVIX) 75 MG tablet      FEROSUL 325 (65 Fe) MG tablet      metoprolol succinate (TOPROL-XL) 50 MG 24 hr tablet TAKE 1 TABLET EVERY DAY 90 tablet 2   Multiple Vitamin (MULTIVITAMIN) tablet Take 1 tablet by mouth daily.     ramipril (ALTACE) 10 MG capsule TAKE 1 CAPSULE EVERY DAY 90 capsule 3   simvastatin (ZOCOR) 80 MG tablet TAKE 1 TABLET EVERY DAY AT 6PM 90 tablet 2   No current facility-administered medications on file prior to visit.    Allergies  Allergen Reactions   Unasyn [Ampicillin-Sulbactam Sodium] Rash    Past Medical History:  Diagnosis Date   Acute cholecystitis 02/14/2012   Anginal pain ([place])    CAD (coronary artery disease) 2007   occlusion of mid LCX, 50-70% LAD   Cataract 2010   bilateral eyes   Choledocholithiasis  02/14/2012   Dyslipidemia    HTN (hypertension)    Macular degeneration    Myocardial infarction ([place]) 1/07   Acute non-ST-elevation myocardial infarction   Overweight(278.02)    with body mass index of 29   TIA (transient ischemic attack)    PER PATIENT , HIS EYE DOCTOR SAW ABNORMALILTY DURING EYE EXAM . TOLD HIM HE HAD A SMALL STROKE  AND THAT WAS THE CAUSE OF A C/O DYSPHAGIA   AT THE TIME, NO RECURRENCE  SINCE THAT TIME, DYSPHAGIA  NOW SINCE IMPROVED  WITH AVOIDANCE OF STRIGNY FOODS     Past Surgical History:  Procedure Laterality Date   CARDIAC CATHETERIZATION  03/17/2005   Ejection fraction is estimated at 55%   CATARACT EXTRACTION W/ INTRAOCULAR LENS  IMPLANT, BILATERAL  ~ 2010   CHOLECYSTECTOMY  02/15/2012   Procedure: LAPAROSCOPIC CHOLECYSTECTOMY WITH INTRAOPERATIVE CHOLANGIOGRAM;  Surgeon: Stark Klein, MD;  Location: [facility];  Service: General;  Laterality: N/A;   KNEE SURGERY  1980's   "took knee out and put it back in" ; left    LAPAROSCOPIC SIGMOID COLECTOMY Left 09/17/2018   Procedure: LAPAROSCOPIC LEFT HEMICOLECTOMY WITH MOBILIZATION OF SPLENNIC FLEXURE;  Surgeon: Alphonsa Overall, MD;  Location: [facility];  Service: General;  Laterality: Left;    Social History   Tobacco Use  Smoking Status Never Smoker  Smokeless Tobacco Never Used    Social  History   Substance and Sexual Activity  Alcohol Use No   Comment: 12/05/2011 "stopped all alcohol > 25 years ago; never drank heavily"    Family History  Problem Relation Age of Onset   Heart disease Father    Cancer Mother        breast   Stomach cancer Mother    Cancer Maternal Aunt        breast   Esophageal cancer Neg Hx    Rectal cancer Neg Hx    Colon cancer Neg Hx    Colon polyps Neg Hx     Review of Systems: As noted in history of present illness.  All other systems were reviewed and are negative.  Physical Exam: BP (!) 150/80 (BP Location: Left Arm, Cuff Size: Normal)    Pulse 68    Ht 6'  2" (1.88 m)    Wt 216 lb 6.4 oz (98.2 kg)    BMI 27.78 kg/m  GENERAL:  Well appearing WM in NAD HEENT:  PERRL, EOMI, sclera are clear. Oropharynx is clear. NECK:  No jugular venous distention, carotid upstroke brisk and symmetric, no bruits, no thyromegaly or adenopathy LUNGS:  Clear to auscultation bilaterally CHEST:  Unremarkable HEART:  RRR,  PMI not displaced or sustained,S1 and S2 within normal limits, no S3, no S4: no clicks, no rubs, no murmurs ABD:  Soft, nontender. BS +, no masses or bruits. No hepatomegaly, no splenomegaly EXT:  2 + pulses throughout, no edema, no cyanosis no clubbing SKIN:  Warm and dry.  No rashes NEURO:  Alert and oriented x 3. Cranial nerves II through XII intact. PSYCH:  Cognitively intact    LABORATORY DATA: Ecg: today- NSR with normal Ecg. Rate 68. Normal.  I have personally reviewed and interpreted this study.  Labs dated 07/02/15: Cholesterol 122, triglycerides 101, HDL 35, LDL 67 Dated 01/12/16: A1c 5.2%, CMET and CBC normal. Dated 07/06/16: cholesterol 120, triglycerides 78, HDL 40, LDL 64. Hgb  16.2 Dated 01/23/17: A1c 5.4%. Normal chemistries. Dated 02/04/18: cholesterol 126, triglycerides 117, HDL 42, LDL 61. A1c 5.5%. Creatinine 1.1. Hgb and ALT normal Dated 07/19/18: cholesterol 121, triglycerides 92, HDL 35, LDL 68 Dated 01/28/19: Hgb 10.5. CMET normal. A1c 5.7%.  Dated 07/23/19: cholesterol 117, triglycerides 122, HDL 34, LDL 59. A1c 5.7%. creatinine 1.3. Hgb 11.4. plts 433K. Otherwise CMET and CBC normal.  Myoview 04/19/16: Study Highlights    The left ventricular ejection fraction is normal (55-65%).  Nuclear stress EF: 59%.  There was no ST segment deviation noted during stress.  No T wave inversion was noted during stress.  Blood pressure demonstrated a normal response to exercise.  Defect 1: There is a small defect of severe severity present in the basal inferolateral and mid inferolateral location.  Findings consistent with prior  myocardial infarction with peri-infarct ischemia.  This is an intermediate risk study.    Assessment / Plan:  1. Coronary disease with history of occlusion of the mid left circumflex coronary in 2007. He is asymptomatic. Myoview study in June of 2018 was unchanged from prior studies. Continue current therapy with Plavix, statin, and beta blocker therapy.   2. Hypertension-well controlled per home readings.  Continue ACE inhibitor and beta blocker therapy.  3. Dyslipidemia patient is on high-dose statin therapy. Well controlled.   4. CVA  on Plavx. No recurrence  Follow up in one year.

## 2020-04-16 ENCOUNTER — Ambulatory Visit (INDEPENDENT_AMBULATORY_CARE_PROVIDER_SITE_OTHER): Payer: Medicare Other | Admitting: Cardiology

## 2020-04-16 ENCOUNTER — Other Ambulatory Visit: Payer: Self-pay

## 2020-04-16 ENCOUNTER — Encounter: Payer: Self-pay | Admitting: Cardiology

## 2020-04-16 VITALS — BP 150/80 | HR 68 | Ht 74.0 in | Wt 216.4 lb

## 2020-04-16 DIAGNOSIS — E785 Hyperlipidemia, unspecified: Secondary | ICD-10-CM | POA: Diagnosis not present

## 2020-04-16 DIAGNOSIS — I10 Essential (primary) hypertension: Secondary | ICD-10-CM

## 2020-04-16 DIAGNOSIS — I2510 Atherosclerotic heart disease of native coronary artery without angina pectoris: Secondary | ICD-10-CM | POA: Diagnosis not present

## 2020-04-19 DIAGNOSIS — Z961 Presence of intraocular lens: Secondary | ICD-10-CM | POA: Diagnosis not present

## 2020-04-19 DIAGNOSIS — H353212 Exudative age-related macular degeneration, right eye, with inactive choroidal neovascularization: Secondary | ICD-10-CM | POA: Diagnosis not present

## 2020-04-19 DIAGNOSIS — H353222 Exudative age-related macular degeneration, left eye, with inactive choroidal neovascularization: Secondary | ICD-10-CM | POA: Diagnosis not present

## 2020-04-19 DIAGNOSIS — H3554 Dystrophies primarily involving the retinal pigment epithelium: Secondary | ICD-10-CM | POA: Diagnosis not present

## 2020-04-19 DIAGNOSIS — H43813 Vitreous degeneration, bilateral: Secondary | ICD-10-CM | POA: Diagnosis not present

## 2020-06-21 ENCOUNTER — Other Ambulatory Visit: Payer: Self-pay | Admitting: Cardiology

## 2020-06-21 DIAGNOSIS — H353222 Exudative age-related macular degeneration, left eye, with inactive choroidal neovascularization: Secondary | ICD-10-CM | POA: Diagnosis not present

## 2020-06-21 DIAGNOSIS — H43813 Vitreous degeneration, bilateral: Secondary | ICD-10-CM | POA: Diagnosis not present

## 2020-06-21 DIAGNOSIS — Z961 Presence of intraocular lens: Secondary | ICD-10-CM | POA: Diagnosis not present

## 2020-06-21 DIAGNOSIS — H353212 Exudative age-related macular degeneration, right eye, with inactive choroidal neovascularization: Secondary | ICD-10-CM | POA: Diagnosis not present

## 2020-07-28 DIAGNOSIS — Z125 Encounter for screening for malignant neoplasm of prostate: Secondary | ICD-10-CM | POA: Diagnosis not present

## 2020-07-28 DIAGNOSIS — E7800 Pure hypercholesterolemia, unspecified: Secondary | ICD-10-CM | POA: Diagnosis not present

## 2020-07-28 DIAGNOSIS — R7302 Impaired glucose tolerance (oral): Secondary | ICD-10-CM | POA: Diagnosis not present

## 2020-08-04 DIAGNOSIS — I872 Venous insufficiency (chronic) (peripheral): Secondary | ICD-10-CM | POA: Diagnosis not present

## 2020-08-04 DIAGNOSIS — E663 Overweight: Secondary | ICD-10-CM | POA: Diagnosis not present

## 2020-08-04 DIAGNOSIS — K921 Melena: Secondary | ICD-10-CM | POA: Diagnosis not present

## 2020-08-04 DIAGNOSIS — I252 Old myocardial infarction: Secondary | ICD-10-CM | POA: Diagnosis not present

## 2020-08-04 DIAGNOSIS — Z1331 Encounter for screening for depression: Secondary | ICD-10-CM | POA: Diagnosis not present

## 2020-08-04 DIAGNOSIS — Z0000 Encounter for general adult medical examination without abnormal findings: Secondary | ICD-10-CM | POA: Diagnosis not present

## 2020-08-04 DIAGNOSIS — I119 Hypertensive heart disease without heart failure: Secondary | ICD-10-CM | POA: Diagnosis not present

## 2020-08-04 DIAGNOSIS — N1831 Chronic kidney disease, stage 3a: Secondary | ICD-10-CM | POA: Diagnosis not present

## 2020-08-04 DIAGNOSIS — E7800 Pure hypercholesterolemia, unspecified: Secondary | ICD-10-CM | POA: Diagnosis not present

## 2020-08-04 DIAGNOSIS — I679 Cerebrovascular disease, unspecified: Secondary | ICD-10-CM | POA: Diagnosis not present

## 2020-08-04 DIAGNOSIS — R7302 Impaired glucose tolerance (oral): Secondary | ICD-10-CM | POA: Diagnosis not present

## 2020-08-04 DIAGNOSIS — I2510 Atherosclerotic heart disease of native coronary artery without angina pectoris: Secondary | ICD-10-CM | POA: Diagnosis not present

## 2020-08-04 DIAGNOSIS — I1310 Hypertensive heart and chronic kidney disease without heart failure, with stage 1 through stage 4 chronic kidney disease, or unspecified chronic kidney disease: Secondary | ICD-10-CM | POA: Diagnosis not present

## 2020-08-04 DIAGNOSIS — R82998 Other abnormal findings in urine: Secondary | ICD-10-CM | POA: Diagnosis not present

## 2020-08-16 DIAGNOSIS — H43813 Vitreous degeneration, bilateral: Secondary | ICD-10-CM | POA: Diagnosis not present

## 2020-08-16 DIAGNOSIS — H353232 Exudative age-related macular degeneration, bilateral, with inactive choroidal neovascularization: Secondary | ICD-10-CM | POA: Diagnosis not present

## 2020-08-16 DIAGNOSIS — H353222 Exudative age-related macular degeneration, left eye, with inactive choroidal neovascularization: Secondary | ICD-10-CM | POA: Diagnosis not present

## 2020-08-16 DIAGNOSIS — Z961 Presence of intraocular lens: Secondary | ICD-10-CM | POA: Diagnosis not present

## 2020-08-16 DIAGNOSIS — H3554 Dystrophies primarily involving the retinal pigment epithelium: Secondary | ICD-10-CM | POA: Diagnosis not present

## 2020-08-16 DIAGNOSIS — H353212 Exudative age-related macular degeneration, right eye, with inactive choroidal neovascularization: Secondary | ICD-10-CM | POA: Diagnosis not present

## 2020-09-06 ENCOUNTER — Other Ambulatory Visit: Payer: Self-pay | Admitting: Cardiology

## 2020-09-27 DIAGNOSIS — H353212 Exudative age-related macular degeneration, right eye, with inactive choroidal neovascularization: Secondary | ICD-10-CM | POA: Diagnosis not present

## 2020-09-27 DIAGNOSIS — H43813 Vitreous degeneration, bilateral: Secondary | ICD-10-CM | POA: Diagnosis not present

## 2020-09-27 DIAGNOSIS — Z961 Presence of intraocular lens: Secondary | ICD-10-CM | POA: Diagnosis not present

## 2020-09-27 DIAGNOSIS — H18513 Endothelial corneal dystrophy, bilateral: Secondary | ICD-10-CM | POA: Diagnosis not present

## 2020-09-27 DIAGNOSIS — H3554 Dystrophies primarily involving the retinal pigment epithelium: Secondary | ICD-10-CM | POA: Diagnosis not present

## 2020-10-13 DIAGNOSIS — Z20822 Contact with and (suspected) exposure to covid-19: Secondary | ICD-10-CM | POA: Diagnosis not present

## 2020-11-29 DIAGNOSIS — H18513 Endothelial corneal dystrophy, bilateral: Secondary | ICD-10-CM | POA: Diagnosis not present

## 2020-11-29 DIAGNOSIS — Z961 Presence of intraocular lens: Secondary | ICD-10-CM | POA: Diagnosis not present

## 2020-11-29 DIAGNOSIS — H3554 Dystrophies primarily involving the retinal pigment epithelium: Secondary | ICD-10-CM | POA: Diagnosis not present

## 2020-11-29 DIAGNOSIS — H43813 Vitreous degeneration, bilateral: Secondary | ICD-10-CM | POA: Diagnosis not present

## 2020-11-29 DIAGNOSIS — H353212 Exudative age-related macular degeneration, right eye, with inactive choroidal neovascularization: Secondary | ICD-10-CM | POA: Diagnosis not present

## 2020-11-29 DIAGNOSIS — H353222 Exudative age-related macular degeneration, left eye, with inactive choroidal neovascularization: Secondary | ICD-10-CM | POA: Diagnosis not present

## 2021-02-07 DIAGNOSIS — H3554 Dystrophies primarily involving the retinal pigment epithelium: Secondary | ICD-10-CM | POA: Diagnosis not present

## 2021-02-07 DIAGNOSIS — H353212 Exudative age-related macular degeneration, right eye, with inactive choroidal neovascularization: Secondary | ICD-10-CM | POA: Diagnosis not present

## 2021-02-28 DIAGNOSIS — D126 Benign neoplasm of colon, unspecified: Secondary | ICD-10-CM | POA: Diagnosis not present

## 2021-02-28 DIAGNOSIS — I252 Old myocardial infarction: Secondary | ICD-10-CM | POA: Diagnosis not present

## 2021-02-28 DIAGNOSIS — E7800 Pure hypercholesterolemia, unspecified: Secondary | ICD-10-CM | POA: Diagnosis not present

## 2021-02-28 DIAGNOSIS — I1310 Hypertensive heart and chronic kidney disease without heart failure, with stage 1 through stage 4 chronic kidney disease, or unspecified chronic kidney disease: Secondary | ICD-10-CM | POA: Diagnosis not present

## 2021-02-28 DIAGNOSIS — I872 Venous insufficiency (chronic) (peripheral): Secondary | ICD-10-CM | POA: Diagnosis not present

## 2021-02-28 DIAGNOSIS — E663 Overweight: Secondary | ICD-10-CM | POA: Diagnosis not present

## 2021-02-28 DIAGNOSIS — I679 Cerebrovascular disease, unspecified: Secondary | ICD-10-CM | POA: Diagnosis not present

## 2021-02-28 DIAGNOSIS — Z9049 Acquired absence of other specified parts of digestive tract: Secondary | ICD-10-CM | POA: Diagnosis not present

## 2021-02-28 DIAGNOSIS — I2510 Atherosclerotic heart disease of native coronary artery without angina pectoris: Secondary | ICD-10-CM | POA: Diagnosis not present

## 2021-02-28 DIAGNOSIS — Z23 Encounter for immunization: Secondary | ICD-10-CM | POA: Diagnosis not present

## 2021-02-28 DIAGNOSIS — R7302 Impaired glucose tolerance (oral): Secondary | ICD-10-CM | POA: Diagnosis not present

## 2021-02-28 DIAGNOSIS — N1831 Chronic kidney disease, stage 3a: Secondary | ICD-10-CM | POA: Diagnosis not present

## 2021-03-01 IMAGING — DX ABDOMEN - 1 VIEW
2 series · 2 of 2 positions shown · non-contrast
Comparison: None.

CLINICAL DATA: Constipation. Patient reports rectal bleeding since
colonoscopy this morning.

EXAM:
ABDOMEN - 1 VIEW

[abdomen kub (1 of 2)]
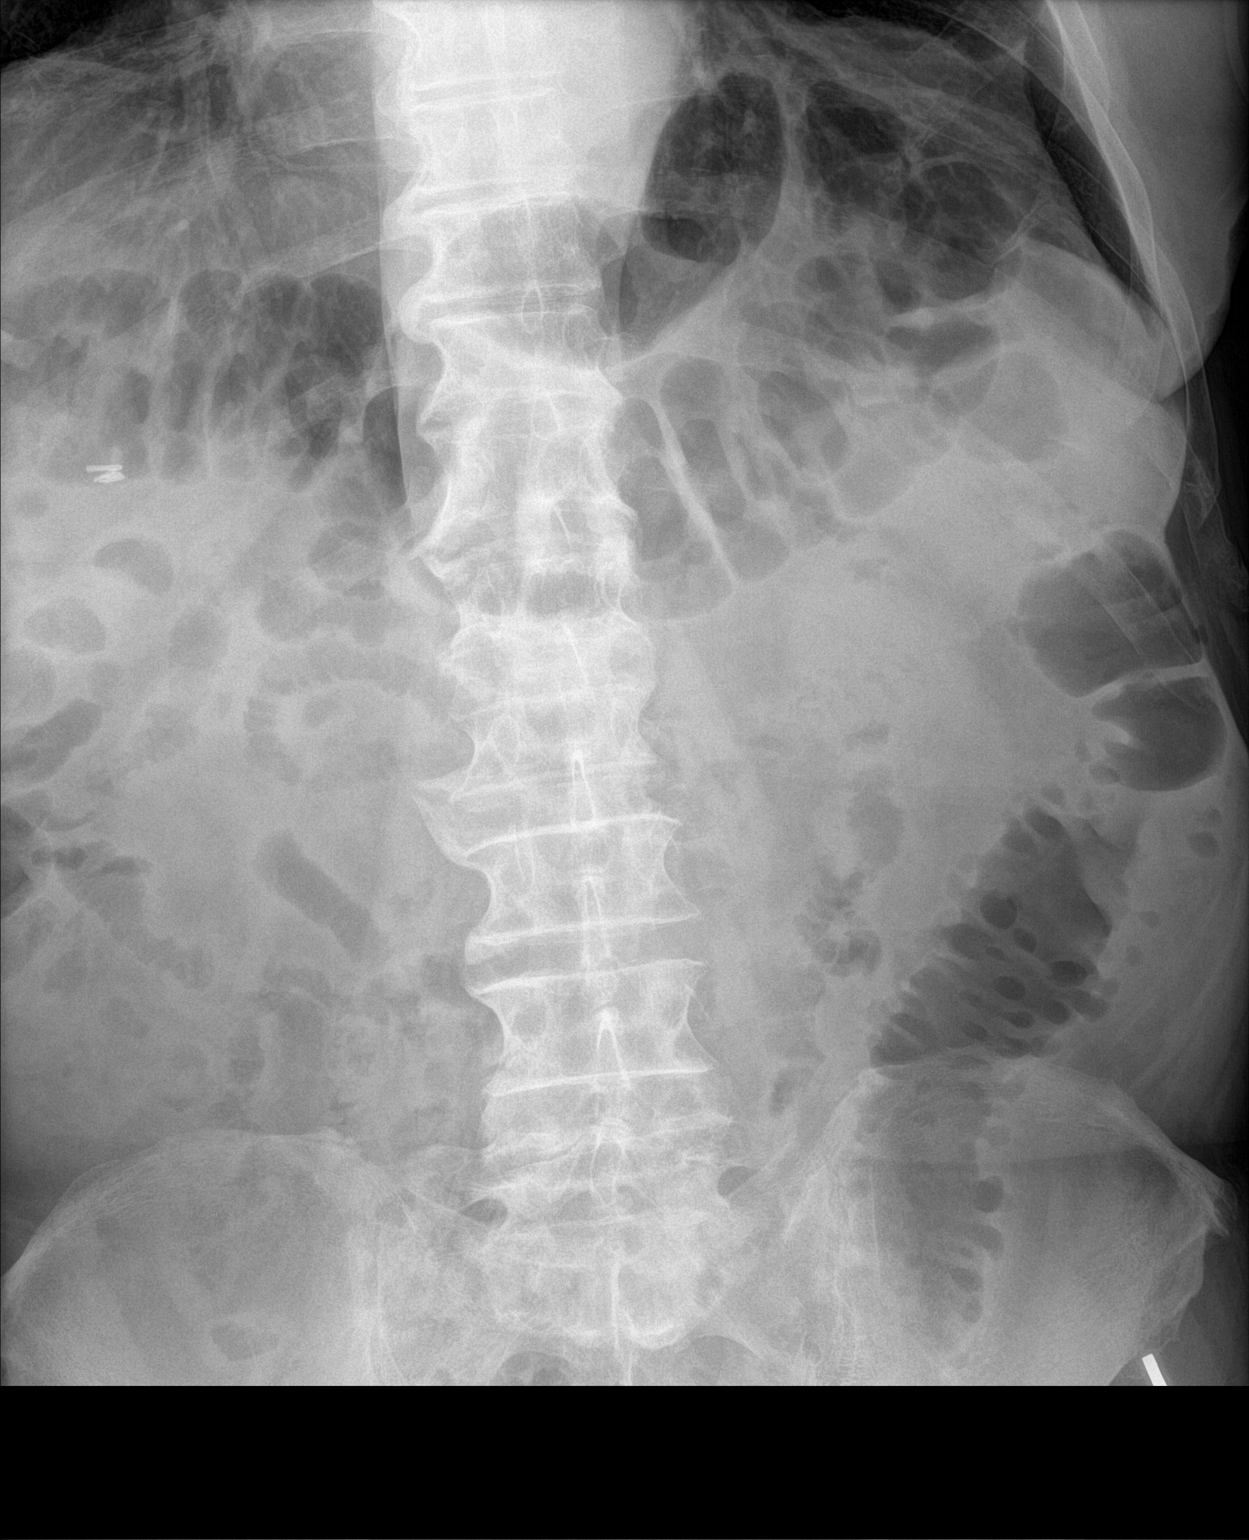

[abdomen kub (2 of 2)]
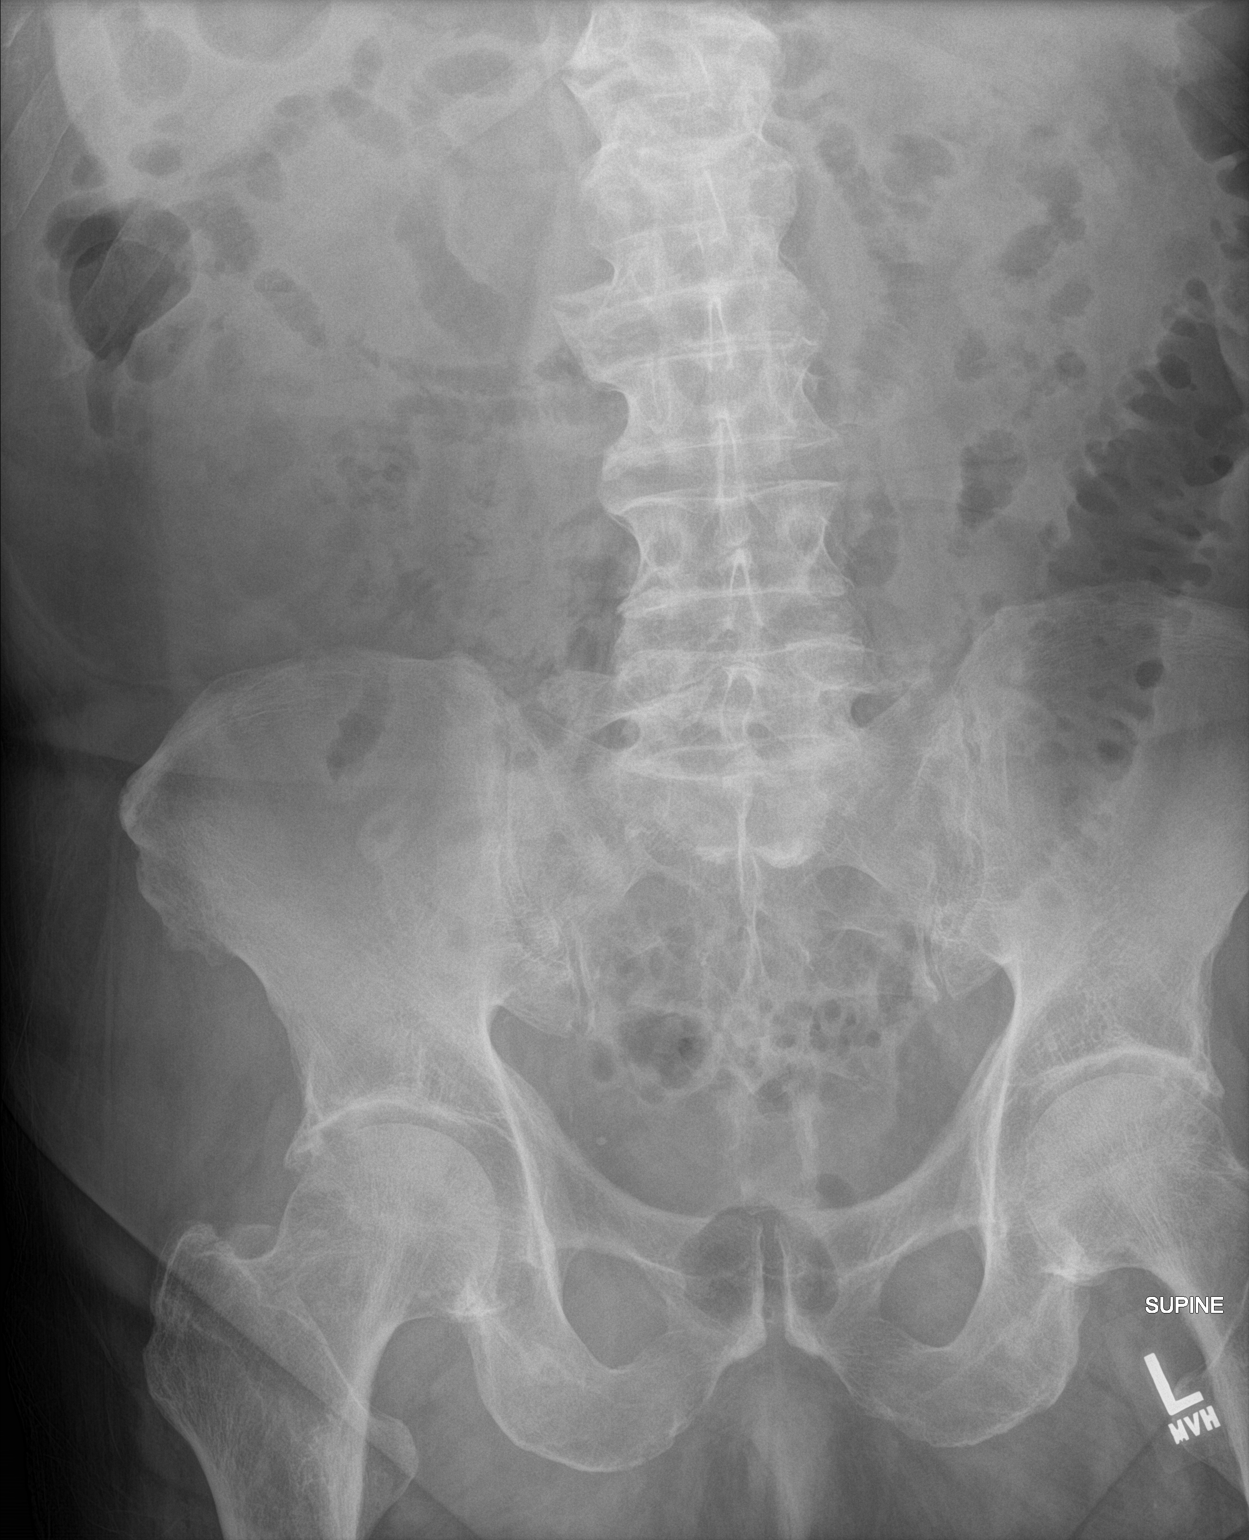

[2 of 2 positions shown; findings below may reference images not displayed]

FINDINGS: Two supine views of the abdomen obtained. No evidence of free air on
supine imaging. Air-filled colon with evidence of diverticulosis in
the left colon. No small bowel obstruction or dilatation.
Cholecystectomy clips in the right upper quadrant. No radiopaque
calculi. No osseous abnormalities.
IMPRESSION: 1. Air-filled colon consistent with recent colonoscopy. Colonic
diverticulosis.
2. No evidence of free air on supine views.

## 2021-03-16 DIAGNOSIS — Z961 Presence of intraocular lens: Secondary | ICD-10-CM | POA: Diagnosis not present

## 2021-03-16 DIAGNOSIS — H353232 Exudative age-related macular degeneration, bilateral, with inactive choroidal neovascularization: Secondary | ICD-10-CM | POA: Diagnosis not present

## 2021-03-16 DIAGNOSIS — H35373 Puckering of macula, bilateral: Secondary | ICD-10-CM | POA: Diagnosis not present

## 2021-03-16 DIAGNOSIS — Z9842 Cataract extraction status, left eye: Secondary | ICD-10-CM | POA: Diagnosis not present

## 2021-03-16 DIAGNOSIS — Z9841 Cataract extraction status, right eye: Secondary | ICD-10-CM | POA: Diagnosis not present

## 2021-03-16 DIAGNOSIS — H43813 Vitreous degeneration, bilateral: Secondary | ICD-10-CM | POA: Diagnosis not present

## 2021-03-16 DIAGNOSIS — H18513 Endothelial corneal dystrophy, bilateral: Secondary | ICD-10-CM | POA: Diagnosis not present

## 2021-04-18 ENCOUNTER — Ambulatory Visit: Payer: Medicare Other | Admitting: Cardiology

## 2021-04-18 DIAGNOSIS — H40001 Preglaucoma, unspecified, right eye: Secondary | ICD-10-CM | POA: Diagnosis not present

## 2021-04-18 DIAGNOSIS — H353222 Exudative age-related macular degeneration, left eye, with inactive choroidal neovascularization: Secondary | ICD-10-CM | POA: Diagnosis not present

## 2021-04-18 DIAGNOSIS — H43813 Vitreous degeneration, bilateral: Secondary | ICD-10-CM | POA: Diagnosis not present

## 2021-04-18 DIAGNOSIS — Z961 Presence of intraocular lens: Secondary | ICD-10-CM | POA: Diagnosis not present

## 2021-04-18 DIAGNOSIS — H18513 Endothelial corneal dystrophy, bilateral: Secondary | ICD-10-CM | POA: Diagnosis not present

## 2021-04-18 DIAGNOSIS — H353212 Exudative age-related macular degeneration, right eye, with inactive choroidal neovascularization: Secondary | ICD-10-CM | POA: Diagnosis not present

## 2021-04-18 DIAGNOSIS — H353232 Exudative age-related macular degeneration, bilateral, with inactive choroidal neovascularization: Secondary | ICD-10-CM | POA: Diagnosis not present

## 2021-04-25 ENCOUNTER — Other Ambulatory Visit: Payer: Self-pay | Admitting: Cardiology

## 2021-04-26 ENCOUNTER — Other Ambulatory Visit: Payer: Self-pay | Admitting: Cardiology

## 2021-05-18 DIAGNOSIS — R051 Acute cough: Secondary | ICD-10-CM | POA: Diagnosis not present

## 2021-05-18 DIAGNOSIS — R059 Cough, unspecified: Secondary | ICD-10-CM | POA: Diagnosis not present

## 2021-05-18 DIAGNOSIS — Z20822 Contact with and (suspected) exposure to covid-19: Secondary | ICD-10-CM | POA: Diagnosis not present

## 2021-06-03 DIAGNOSIS — Z20828 Contact with and (suspected) exposure to other viral communicable diseases: Secondary | ICD-10-CM | POA: Diagnosis not present

## 2021-06-07 NOTE — Progress Notes (Signed)
? ?[name] ?Date of Birth: Jun 23, 1940 ?Medical Record #[id number] ? ?History of Present Illness: ?[name] is seen today for followup CAD. He has a known history of coronary disease with a myocardial infarction in 2007. Cardiac catheterization at that time demonstrated occlusion of the mid left circumflex coronary. He had a 50-70% stenosis in the mid LAD that was eccentric. He had a Myoview study in January 2018 that showed a defect in the inferior lateral wall. This was unchanged compared to prior studies. ? ? He has a history of a "mini stroke"  November 2015 with swallowing difficulty. MRI showed no acute change but there were chronic ischemic changes in the right parietal lobe that were new since 2013. He was switched from ASA to Plavix.  ? ?In July 2020 he underwent left hemicolectomy for a colon mass that was benign.  ? ?On follow up today he is doing very well. He denies any chest pain or SOB. He continues to walk daily for 2-3 miles.  Works at his [employer]. BP readings at home have been normal - typically 130/70. No palpitations or edema. He has lost weight - down 18 lbs.  ? ?Current Outpatient Medications on File Prior to Visit  ?Medication Sig Dispense Refill  ? clopidogrel (PLAVIX) 75 MG tablet     ? FEROSUL 325 (65 Fe) MG tablet     ? metoprolol succinate (TOPROL-XL) 50 MG 24 hr tablet TAKE 1 TABLET EVERY DAY 90 tablet 3  ? Multiple Vitamin (MULTIVITAMIN) tablet Take 1 tablet by mouth daily.    ? ramipril (ALTACE) 10 MG capsule TAKE 1 CAPSULE EVERY DAY 90 capsule 3  ? simvastatin (ZOCOR) 80 MG tablet TAKE 1 TABLET EVERY DAY AT 6PM 90 tablet 1  ? ?No current facility-administered medications on file prior to visit.  ? ? ?Allergies  ?Allergen Reactions  ? Unasyn [Ampicillin-Sulbactam Sodium] Rash  ? ? ?Past Medical History:  ?Diagnosis Date  ? Acute cholecystitis 02/14/2012  ? Anginal pain ([place])   ? CAD (coronary artery disease) 2007  ? occlusion of mid LCX, 50-70% LAD  ? Cataract 2010  ?  bilateral eyes  ? Choledocholithiasis 02/14/2012  ? Dyslipidemia   ? HTN (hypertension)   ? Macular degeneration   ? Myocardial infarction ([employer] [place]) 1/07  ? Acute non-ST-elevation myocardial infarction  ? Overweight(278.02)   ? with body mass index of 29  ? TIA (transient ischemic attack)   ? PER PATIENT , HIS EYE DOCTOR SAW ABNORMALILTY DURING EYE EXAM . TOLD HIM HE HAD A SMALL STROKE  AND THAT WAS THE CAUSE OF A C/O DYSPHAGIA   AT THE TIME, NO RECURRENCE  SINCE THAT TIME, DYSPHAGIA  NOW SINCE IMPROVED  WITH AVOIDANCE OF STRIGNY FOODS   ? ? ?Past Surgical History:  ?Procedure Laterality Date  ? CARDIAC CATHETERIZATION  03/17/2005  ? Ejection fraction is estimated at 55%  ? CATARACT EXTRACTION W/ INTRAOCULAR LENS  IMPLANT, BILATERAL  ~ 2010  ? CHOLECYSTECTOMY  02/15/2012  ? Procedure: LAPAROSCOPIC CHOLECYSTECTOMY WITH INTRAOPERATIVE CHOLANGIOGRAM;  Surgeon: Stark Klein, MD;  Location: [facility];  Service: General;  Laterality: N/A;  ? KNEE SURGERY  1980's  ? "took knee out and put it back in" ; left   ? LAPAROSCOPIC SIGMOID COLECTOMY Left 09/17/2018  ? Procedure: LAPAROSCOPIC LEFT HEMICOLECTOMY WITH MOBILIZATION OF SPLENNIC FLEXURE;  Surgeon: Alphonsa Overall, MD;  [facility];  Service: General;  Laterality: Left;  ? ? ?Social History  ? ?Tobacco Use  ?Smoking Status  Never  ?Smokeless Tobacco Never  ? ? ?Social History  ? ?Substance and Sexual Activity  ?Alcohol Use No  ? Comment: 12/05/2011 "stopped all alcohol > 25 years ago; never drank heavily"  ? ? ?Family History  ?Problem Relation Age of Onset  ? Heart disease Father   ? Cancer Mother   ?     breast  ? Stomach cancer Mother   ? Cancer Maternal Aunt   ?     breast  ? Esophageal cancer Neg Hx   ? Rectal cancer Neg Hx   ? Colon cancer Neg Hx   ? Colon polyps Neg Hx   ? ? ?Review of Systems: ?As noted in history of present illness.  All other systems were reviewed and are negative. ? ?Physical Exam: ?BP (!) 150/62   Pulse 62   Ht '6\' 2"'$  (1.88 m)   Wt 198 lb 9.6 oz  (90.1 kg)   SpO2 98%   BMI 25.50 kg/m?  ?GENERAL:  Well appearing WM in NAD ?HEENT:  PERRL, EOMI, sclera are clear. Oropharynx is clear. ?NECK:  No jugular venous distention, carotid upstroke brisk and symmetric, no bruits, no thyromegaly or adenopathy ?LUNGS:  Clear to auscultation bilaterally ?CHEST:  Unremarkable ?HEART:  RRR,  PMI not displaced or sustained,S1 and S2 within normal limits, no S3, no S4: no clicks, no rubs, no murmurs ?ABD:  Soft, nontender. BS +, no masses or bruits. No hepatomegaly, no splenomegaly ?EXT:  2 + pulses throughout, no edema, no cyanosis no clubbing ?SKIN:  Warm and dry.  No rashes ?NEURO:  Alert and oriented x 3. Cranial nerves II through XII intact. ?PSYCH:  Cognitively intact ? ? ? ?LABORATORY DATA: ?Ecg: today- NSR with normal Ecg. Rate 62. Normal.  I have personally reviewed and interpreted this study. ? ?Labs dated 07/02/15: Cholesterol 122, triglycerides 101, HDL 35, LDL 67 ?Dated 01/12/16: A1c 5.2%, CMET and CBC normal. ?Dated 07/06/16: cholesterol 120, triglycerides 78, HDL 40, LDL 64. Hgb  16.2 ?Dated 01/23/17: A1c 5.4%. Normal chemistries. ?Dated 02/04/18: cholesterol 126, triglycerides 117, HDL 42, LDL 61. A1c 5.5%. Creatinine 1.1. Hgb and ALT normal ?Dated 07/19/18: cholesterol 121, triglycerides 92, HDL 35, LDL 68 ?Dated 01/28/19: Hgb 10.5. CMET normal. A1c 5.7%.  ?Dated 07/23/19: cholesterol 117, triglycerides 122, HDL 34, LDL 59. A1c 5.7%. creatinine 1.3. Hgb 11.4. plts 433K. Otherwise CMET and CBC normal. ?Dated 07/28/20: cholesterol 137, triglycerides 99. HDL 36, LDL 81. ?Dated 02/28/21: A1c 5.6%. LFTs normal. GFR 64.  ? ?Myoview 04/19/16: Study Highlights  ? ?The left ventricular ejection fraction is normal (55-65%). ?Nuclear stress EF: 59%. ?There was no ST segment deviation noted during stress. ?No T wave inversion was noted during stress. ?Blood pressure demonstrated a normal response to exercise. ?Defect 1: There is a small defect of severe severity present in the  basal inferolateral and mid inferolateral location. ?Findings consistent with prior myocardial infarction with peri-infarct ischemia. ?This is an intermediate risk study.  ? ? ?Assessment / Plan: ? 1. Coronary disease with history of occlusion of the mid left circumflex coronary in 2007. He is asymptomatic. Myoview study in June of 2018 was unchanged from prior studies. Continue current therapy with Plavix, statin, and beta blocker therapy.  ? ?2. Hypertension-well controlled per home readings.  Continue ACE inhibitor and beta blocker therapy. ? ?3. Dyslipidemia patient is on high-dose simvastatin  therapy. Last LDL up to 81. I would anticipate that with his weight loss this will be better this year. Labs done  with PCP ? ?4. CVA  on Plavx. No recurrence ? ?Follow up in one year. ? ?

## 2021-06-13 ENCOUNTER — Ambulatory Visit (INDEPENDENT_AMBULATORY_CARE_PROVIDER_SITE_OTHER): Payer: Medicare Other | Admitting: Cardiology

## 2021-06-13 ENCOUNTER — Encounter: Payer: Self-pay | Admitting: Cardiology

## 2021-06-13 VITALS — BP 150/62 | HR 62 | Ht 74.0 in | Wt 198.6 lb

## 2021-06-13 DIAGNOSIS — I2510 Atherosclerotic heart disease of native coronary artery without angina pectoris: Secondary | ICD-10-CM | POA: Diagnosis not present

## 2021-06-13 DIAGNOSIS — I10 Essential (primary) hypertension: Secondary | ICD-10-CM

## 2021-06-13 DIAGNOSIS — E785 Hyperlipidemia, unspecified: Secondary | ICD-10-CM | POA: Diagnosis not present

## 2021-06-16 DIAGNOSIS — Z20822 Contact with and (suspected) exposure to covid-19: Secondary | ICD-10-CM | POA: Diagnosis not present

## 2021-06-27 DIAGNOSIS — Z20822 Contact with and (suspected) exposure to covid-19: Secondary | ICD-10-CM | POA: Diagnosis not present

## 2021-06-27 DIAGNOSIS — H353212 Exudative age-related macular degeneration, right eye, with inactive choroidal neovascularization: Secondary | ICD-10-CM | POA: Diagnosis not present

## 2021-06-27 DIAGNOSIS — H43813 Vitreous degeneration, bilateral: Secondary | ICD-10-CM | POA: Diagnosis not present

## 2021-06-27 DIAGNOSIS — H3554 Dystrophies primarily involving the retinal pigment epithelium: Secondary | ICD-10-CM | POA: Diagnosis not present

## 2021-06-27 DIAGNOSIS — H353221 Exudative age-related macular degeneration, left eye, with active choroidal neovascularization: Secondary | ICD-10-CM | POA: Diagnosis not present

## 2021-06-27 DIAGNOSIS — Z961 Presence of intraocular lens: Secondary | ICD-10-CM | POA: Diagnosis not present

## 2021-06-30 DIAGNOSIS — Z961 Presence of intraocular lens: Secondary | ICD-10-CM | POA: Diagnosis not present

## 2021-06-30 DIAGNOSIS — H35373 Puckering of macula, bilateral: Secondary | ICD-10-CM | POA: Diagnosis not present

## 2021-06-30 DIAGNOSIS — H4312 Vitreous hemorrhage, left eye: Secondary | ICD-10-CM | POA: Diagnosis not present

## 2021-06-30 DIAGNOSIS — H353221 Exudative age-related macular degeneration, left eye, with active choroidal neovascularization: Secondary | ICD-10-CM | POA: Diagnosis not present

## 2021-06-30 DIAGNOSIS — H43813 Vitreous degeneration, bilateral: Secondary | ICD-10-CM | POA: Diagnosis not present

## 2021-06-30 DIAGNOSIS — H18593 Other hereditary corneal dystrophies, bilateral: Secondary | ICD-10-CM | POA: Diagnosis not present

## 2021-06-30 DIAGNOSIS — H353212 Exudative age-related macular degeneration, right eye, with inactive choroidal neovascularization: Secondary | ICD-10-CM | POA: Diagnosis not present

## 2021-06-30 DIAGNOSIS — H40001 Preglaucoma, unspecified, right eye: Secondary | ICD-10-CM | POA: Diagnosis not present

## 2021-07-07 DIAGNOSIS — Z961 Presence of intraocular lens: Secondary | ICD-10-CM | POA: Diagnosis not present

## 2021-07-07 DIAGNOSIS — H35373 Puckering of macula, bilateral: Secondary | ICD-10-CM | POA: Diagnosis not present

## 2021-07-07 DIAGNOSIS — H40001 Preglaucoma, unspecified, right eye: Secondary | ICD-10-CM | POA: Diagnosis not present

## 2021-07-07 DIAGNOSIS — H353212 Exudative age-related macular degeneration, right eye, with inactive choroidal neovascularization: Secondary | ICD-10-CM | POA: Diagnosis not present

## 2021-07-07 DIAGNOSIS — H353221 Exudative age-related macular degeneration, left eye, with active choroidal neovascularization: Secondary | ICD-10-CM | POA: Diagnosis not present

## 2021-07-07 DIAGNOSIS — H18513 Endothelial corneal dystrophy, bilateral: Secondary | ICD-10-CM | POA: Diagnosis not present

## 2021-07-07 DIAGNOSIS — H4312 Vitreous hemorrhage, left eye: Secondary | ICD-10-CM | POA: Diagnosis not present

## 2021-07-07 DIAGNOSIS — H43813 Vitreous degeneration, bilateral: Secondary | ICD-10-CM | POA: Diagnosis not present

## 2021-07-12 DIAGNOSIS — Z20822 Contact with and (suspected) exposure to covid-19: Secondary | ICD-10-CM | POA: Diagnosis not present

## 2021-07-18 DIAGNOSIS — Z20822 Contact with and (suspected) exposure to covid-19: Secondary | ICD-10-CM | POA: Diagnosis not present

## 2021-08-11 DIAGNOSIS — H43813 Vitreous degeneration, bilateral: Secondary | ICD-10-CM | POA: Diagnosis not present

## 2021-08-11 DIAGNOSIS — H18593 Other hereditary corneal dystrophies, bilateral: Secondary | ICD-10-CM | POA: Diagnosis not present

## 2021-08-11 DIAGNOSIS — H4312 Vitreous hemorrhage, left eye: Secondary | ICD-10-CM | POA: Diagnosis not present

## 2021-08-11 DIAGNOSIS — H353212 Exudative age-related macular degeneration, right eye, with inactive choroidal neovascularization: Secondary | ICD-10-CM | POA: Diagnosis not present

## 2021-08-11 DIAGNOSIS — H40001 Preglaucoma, unspecified, right eye: Secondary | ICD-10-CM | POA: Diagnosis not present

## 2021-08-11 DIAGNOSIS — H35373 Puckering of macula, bilateral: Secondary | ICD-10-CM | POA: Diagnosis not present

## 2021-08-11 DIAGNOSIS — Z961 Presence of intraocular lens: Secondary | ICD-10-CM | POA: Diagnosis not present

## 2021-08-11 DIAGNOSIS — H353221 Exudative age-related macular degeneration, left eye, with active choroidal neovascularization: Secondary | ICD-10-CM | POA: Diagnosis not present

## 2021-08-17 DIAGNOSIS — E7800 Pure hypercholesterolemia, unspecified: Secondary | ICD-10-CM | POA: Diagnosis not present

## 2021-08-17 DIAGNOSIS — Z125 Encounter for screening for malignant neoplasm of prostate: Secondary | ICD-10-CM | POA: Diagnosis not present

## 2021-08-17 DIAGNOSIS — I119 Hypertensive heart disease without heart failure: Secondary | ICD-10-CM | POA: Diagnosis not present

## 2021-08-17 DIAGNOSIS — R7302 Impaired glucose tolerance (oral): Secondary | ICD-10-CM | POA: Diagnosis not present

## 2021-08-24 DIAGNOSIS — E7800 Pure hypercholesterolemia, unspecified: Secondary | ICD-10-CM | POA: Diagnosis not present

## 2021-08-24 DIAGNOSIS — I679 Cerebrovascular disease, unspecified: Secondary | ICD-10-CM | POA: Diagnosis not present

## 2021-08-24 DIAGNOSIS — I252 Old myocardial infarction: Secondary | ICD-10-CM | POA: Diagnosis not present

## 2021-08-24 DIAGNOSIS — I1310 Hypertensive heart and chronic kidney disease without heart failure, with stage 1 through stage 4 chronic kidney disease, or unspecified chronic kidney disease: Secondary | ICD-10-CM | POA: Diagnosis not present

## 2021-08-24 DIAGNOSIS — R82998 Other abnormal findings in urine: Secondary | ICD-10-CM | POA: Diagnosis not present

## 2021-08-24 DIAGNOSIS — Z9049 Acquired absence of other specified parts of digestive tract: Secondary | ICD-10-CM | POA: Diagnosis not present

## 2021-08-24 DIAGNOSIS — N1831 Chronic kidney disease, stage 3a: Secondary | ICD-10-CM | POA: Diagnosis not present

## 2021-08-24 DIAGNOSIS — Z1339 Encounter for screening examination for other mental health and behavioral disorders: Secondary | ICD-10-CM | POA: Diagnosis not present

## 2021-08-24 DIAGNOSIS — I2510 Atherosclerotic heart disease of native coronary artery without angina pectoris: Secondary | ICD-10-CM | POA: Diagnosis not present

## 2021-08-24 DIAGNOSIS — Z1331 Encounter for screening for depression: Secondary | ICD-10-CM | POA: Diagnosis not present

## 2021-08-24 DIAGNOSIS — I119 Hypertensive heart disease without heart failure: Secondary | ICD-10-CM | POA: Diagnosis not present

## 2021-08-24 DIAGNOSIS — I872 Venous insufficiency (chronic) (peripheral): Secondary | ICD-10-CM | POA: Diagnosis not present

## 2021-08-24 DIAGNOSIS — Z0000 Encounter for general adult medical examination without abnormal findings: Secondary | ICD-10-CM | POA: Diagnosis not present

## 2021-09-22 DIAGNOSIS — H18593 Other hereditary corneal dystrophies, bilateral: Secondary | ICD-10-CM | POA: Diagnosis not present

## 2021-09-22 DIAGNOSIS — H18513 Endothelial corneal dystrophy, bilateral: Secondary | ICD-10-CM | POA: Diagnosis not present

## 2021-09-22 DIAGNOSIS — H353221 Exudative age-related macular degeneration, left eye, with active choroidal neovascularization: Secondary | ICD-10-CM | POA: Diagnosis not present

## 2021-09-22 DIAGNOSIS — H40001 Preglaucoma, unspecified, right eye: Secondary | ICD-10-CM | POA: Diagnosis not present

## 2021-09-22 DIAGNOSIS — Z961 Presence of intraocular lens: Secondary | ICD-10-CM | POA: Diagnosis not present

## 2021-09-22 DIAGNOSIS — H35373 Puckering of macula, bilateral: Secondary | ICD-10-CM | POA: Diagnosis not present

## 2021-09-22 DIAGNOSIS — H353212 Exudative age-related macular degeneration, right eye, with inactive choroidal neovascularization: Secondary | ICD-10-CM | POA: Diagnosis not present

## 2021-10-04 ENCOUNTER — Other Ambulatory Visit: Payer: Self-pay | Admitting: Cardiology

## 2021-10-06 ENCOUNTER — Other Ambulatory Visit: Payer: Self-pay | Admitting: Cardiology

## 2021-11-10 DIAGNOSIS — Z961 Presence of intraocular lens: Secondary | ICD-10-CM | POA: Diagnosis not present

## 2021-11-10 DIAGNOSIS — H353212 Exudative age-related macular degeneration, right eye, with inactive choroidal neovascularization: Secondary | ICD-10-CM | POA: Diagnosis not present

## 2021-11-10 DIAGNOSIS — H18593 Other hereditary corneal dystrophies, bilateral: Secondary | ICD-10-CM | POA: Diagnosis not present

## 2021-11-10 DIAGNOSIS — H35373 Puckering of macula, bilateral: Secondary | ICD-10-CM | POA: Diagnosis not present

## 2021-11-10 DIAGNOSIS — H353221 Exudative age-related macular degeneration, left eye, with active choroidal neovascularization: Secondary | ICD-10-CM | POA: Diagnosis not present

## 2021-11-10 DIAGNOSIS — H40001 Preglaucoma, unspecified, right eye: Secondary | ICD-10-CM | POA: Diagnosis not present

## 2021-12-08 DIAGNOSIS — H18513 Endothelial corneal dystrophy, bilateral: Secondary | ICD-10-CM | POA: Diagnosis not present

## 2021-12-08 DIAGNOSIS — Z7982 Long term (current) use of aspirin: Secondary | ICD-10-CM | POA: Diagnosis not present

## 2021-12-08 DIAGNOSIS — H35373 Puckering of macula, bilateral: Secondary | ICD-10-CM | POA: Diagnosis not present

## 2021-12-08 DIAGNOSIS — Z961 Presence of intraocular lens: Secondary | ICD-10-CM | POA: Diagnosis not present

## 2021-12-08 DIAGNOSIS — H40001 Preglaucoma, unspecified, right eye: Secondary | ICD-10-CM | POA: Diagnosis not present

## 2021-12-08 DIAGNOSIS — H353212 Exudative age-related macular degeneration, right eye, with inactive choroidal neovascularization: Secondary | ICD-10-CM | POA: Diagnosis not present

## 2021-12-08 DIAGNOSIS — H4312 Vitreous hemorrhage, left eye: Secondary | ICD-10-CM | POA: Diagnosis not present

## 2021-12-08 DIAGNOSIS — H353221 Exudative age-related macular degeneration, left eye, with active choroidal neovascularization: Secondary | ICD-10-CM | POA: Diagnosis not present

## 2021-12-08 DIAGNOSIS — H43813 Vitreous degeneration, bilateral: Secondary | ICD-10-CM | POA: Diagnosis not present

## 2022-01-12 DIAGNOSIS — Z961 Presence of intraocular lens: Secondary | ICD-10-CM | POA: Diagnosis not present

## 2022-01-12 DIAGNOSIS — H18513 Endothelial corneal dystrophy, bilateral: Secondary | ICD-10-CM | POA: Diagnosis not present

## 2022-01-12 DIAGNOSIS — H40001 Preglaucoma, unspecified, right eye: Secondary | ICD-10-CM | POA: Diagnosis not present

## 2022-01-12 DIAGNOSIS — H353212 Exudative age-related macular degeneration, right eye, with inactive choroidal neovascularization: Secondary | ICD-10-CM | POA: Diagnosis not present

## 2022-01-12 DIAGNOSIS — H353221 Exudative age-related macular degeneration, left eye, with active choroidal neovascularization: Secondary | ICD-10-CM | POA: Diagnosis not present

## 2022-01-12 DIAGNOSIS — H4312 Vitreous hemorrhage, left eye: Secondary | ICD-10-CM | POA: Diagnosis not present

## 2022-01-12 DIAGNOSIS — H18593 Other hereditary corneal dystrophies, bilateral: Secondary | ICD-10-CM | POA: Diagnosis not present

## 2022-01-12 DIAGNOSIS — H43813 Vitreous degeneration, bilateral: Secondary | ICD-10-CM | POA: Diagnosis not present

## 2022-02-09 DIAGNOSIS — H35373 Puckering of macula, bilateral: Secondary | ICD-10-CM | POA: Diagnosis not present

## 2022-02-09 DIAGNOSIS — Z961 Presence of intraocular lens: Secondary | ICD-10-CM | POA: Diagnosis not present

## 2022-02-09 DIAGNOSIS — H40001 Preglaucoma, unspecified, right eye: Secondary | ICD-10-CM | POA: Diagnosis not present

## 2022-02-09 DIAGNOSIS — H43813 Vitreous degeneration, bilateral: Secondary | ICD-10-CM | POA: Diagnosis not present

## 2022-02-09 DIAGNOSIS — H353231 Exudative age-related macular degeneration, bilateral, with active choroidal neovascularization: Secondary | ICD-10-CM | POA: Diagnosis not present

## 2022-02-09 DIAGNOSIS — H4312 Vitreous hemorrhage, left eye: Secondary | ICD-10-CM | POA: Diagnosis not present

## 2022-02-09 DIAGNOSIS — H18513 Endothelial corneal dystrophy, bilateral: Secondary | ICD-10-CM | POA: Diagnosis not present

## 2022-02-23 DIAGNOSIS — Z961 Presence of intraocular lens: Secondary | ICD-10-CM | POA: Diagnosis not present

## 2022-02-23 DIAGNOSIS — H18513 Endothelial corneal dystrophy, bilateral: Secondary | ICD-10-CM | POA: Diagnosis not present

## 2022-02-23 DIAGNOSIS — H0102A Squamous blepharitis right eye, upper and lower eyelids: Secondary | ICD-10-CM | POA: Diagnosis not present

## 2022-02-23 DIAGNOSIS — H353232 Exudative age-related macular degeneration, bilateral, with inactive choroidal neovascularization: Secondary | ICD-10-CM | POA: Diagnosis not present

## 2022-02-23 DIAGNOSIS — H0102B Squamous blepharitis left eye, upper and lower eyelids: Secondary | ICD-10-CM | POA: Diagnosis not present

## 2022-03-10 DIAGNOSIS — Z881 Allergy status to other antibiotic agents status: Secondary | ICD-10-CM | POA: Diagnosis not present

## 2022-03-10 DIAGNOSIS — H43813 Vitreous degeneration, bilateral: Secondary | ICD-10-CM | POA: Diagnosis not present

## 2022-03-10 DIAGNOSIS — Z880 Allergy status to penicillin: Secondary | ICD-10-CM | POA: Diagnosis not present

## 2022-03-10 DIAGNOSIS — H02402 Unspecified ptosis of left eyelid: Secondary | ICD-10-CM | POA: Diagnosis not present

## 2022-03-10 DIAGNOSIS — H18513 Endothelial corneal dystrophy, bilateral: Secondary | ICD-10-CM | POA: Diagnosis not present

## 2022-03-10 DIAGNOSIS — H35373 Puckering of macula, bilateral: Secondary | ICD-10-CM | POA: Diagnosis not present

## 2022-03-10 DIAGNOSIS — H353231 Exudative age-related macular degeneration, bilateral, with active choroidal neovascularization: Secondary | ICD-10-CM | POA: Diagnosis not present

## 2022-03-10 DIAGNOSIS — Z961 Presence of intraocular lens: Secondary | ICD-10-CM | POA: Diagnosis not present

## 2022-03-10 DIAGNOSIS — H11153 Pinguecula, bilateral: Secondary | ICD-10-CM | POA: Diagnosis not present

## 2022-03-10 DIAGNOSIS — H40001 Preglaucoma, unspecified, right eye: Secondary | ICD-10-CM | POA: Diagnosis not present

## 2022-03-17 DIAGNOSIS — I1310 Hypertensive heart and chronic kidney disease without heart failure, with stage 1 through stage 4 chronic kidney disease, or unspecified chronic kidney disease: Secondary | ICD-10-CM | POA: Diagnosis not present

## 2022-03-17 DIAGNOSIS — I872 Venous insufficiency (chronic) (peripheral): Secondary | ICD-10-CM | POA: Diagnosis not present

## 2022-03-17 DIAGNOSIS — N1831 Chronic kidney disease, stage 3a: Secondary | ICD-10-CM | POA: Diagnosis not present

## 2022-03-17 DIAGNOSIS — I2510 Atherosclerotic heart disease of native coronary artery without angina pectoris: Secondary | ICD-10-CM | POA: Diagnosis not present

## 2022-03-17 DIAGNOSIS — I679 Cerebrovascular disease, unspecified: Secondary | ICD-10-CM | POA: Diagnosis not present

## 2022-03-17 DIAGNOSIS — D126 Benign neoplasm of colon, unspecified: Secondary | ICD-10-CM | POA: Diagnosis not present

## 2022-03-17 DIAGNOSIS — E7800 Pure hypercholesterolemia, unspecified: Secondary | ICD-10-CM | POA: Diagnosis not present

## 2022-03-17 DIAGNOSIS — R7302 Impaired glucose tolerance (oral): Secondary | ICD-10-CM | POA: Diagnosis not present

## 2022-03-17 DIAGNOSIS — Z9049 Acquired absence of other specified parts of digestive tract: Secondary | ICD-10-CM | POA: Diagnosis not present

## 2022-03-17 DIAGNOSIS — I252 Old myocardial infarction: Secondary | ICD-10-CM | POA: Diagnosis not present

## 2022-03-17 DIAGNOSIS — Z23 Encounter for immunization: Secondary | ICD-10-CM | POA: Diagnosis not present

## 2022-03-17 DIAGNOSIS — E663 Overweight: Secondary | ICD-10-CM | POA: Diagnosis not present

## 2022-04-24 DIAGNOSIS — Z961 Presence of intraocular lens: Secondary | ICD-10-CM | POA: Diagnosis not present

## 2022-04-24 DIAGNOSIS — H02403 Unspecified ptosis of bilateral eyelids: Secondary | ICD-10-CM | POA: Diagnosis not present

## 2022-04-24 DIAGNOSIS — H3554 Dystrophies primarily involving the retinal pigment epithelium: Secondary | ICD-10-CM | POA: Diagnosis not present

## 2022-04-24 DIAGNOSIS — H353231 Exudative age-related macular degeneration, bilateral, with active choroidal neovascularization: Secondary | ICD-10-CM | POA: Diagnosis not present

## 2022-04-24 DIAGNOSIS — Z880 Allergy status to penicillin: Secondary | ICD-10-CM | POA: Diagnosis not present

## 2022-04-24 DIAGNOSIS — H43813 Vitreous degeneration, bilateral: Secondary | ICD-10-CM | POA: Diagnosis not present

## 2022-05-04 DIAGNOSIS — R5383 Other fatigue: Secondary | ICD-10-CM | POA: Diagnosis not present

## 2022-05-04 DIAGNOSIS — Z1152 Encounter for screening for COVID-19: Secondary | ICD-10-CM | POA: Diagnosis not present

## 2022-05-04 DIAGNOSIS — R059 Cough, unspecified: Secondary | ICD-10-CM | POA: Diagnosis not present

## 2022-05-04 DIAGNOSIS — J302 Other seasonal allergic rhinitis: Secondary | ICD-10-CM | POA: Diagnosis not present

## 2022-05-04 DIAGNOSIS — J069 Acute upper respiratory infection, unspecified: Secondary | ICD-10-CM | POA: Diagnosis not present

## 2022-05-04 DIAGNOSIS — R0981 Nasal congestion: Secondary | ICD-10-CM | POA: Diagnosis not present

## 2022-05-24 ENCOUNTER — Other Ambulatory Visit: Payer: Self-pay | Admitting: Cardiology

## 2022-06-14 DIAGNOSIS — Z961 Presence of intraocular lens: Secondary | ICD-10-CM | POA: Diagnosis not present

## 2022-06-14 DIAGNOSIS — H02403 Unspecified ptosis of bilateral eyelids: Secondary | ICD-10-CM | POA: Diagnosis not present

## 2022-06-14 DIAGNOSIS — H353221 Exudative age-related macular degeneration, left eye, with active choroidal neovascularization: Secondary | ICD-10-CM | POA: Diagnosis not present

## 2022-06-14 DIAGNOSIS — H353212 Exudative age-related macular degeneration, right eye, with inactive choroidal neovascularization: Secondary | ICD-10-CM | POA: Diagnosis not present

## 2022-06-14 DIAGNOSIS — H353211 Exudative age-related macular degeneration, right eye, with active choroidal neovascularization: Secondary | ICD-10-CM | POA: Diagnosis not present

## 2022-06-14 DIAGNOSIS — H43813 Vitreous degeneration, bilateral: Secondary | ICD-10-CM | POA: Diagnosis not present

## 2022-07-20 ENCOUNTER — Ambulatory Visit: Payer: Medicare Other | Admitting: Cardiology

## 2022-07-26 ENCOUNTER — Other Ambulatory Visit: Payer: Self-pay | Admitting: Cardiology

## 2022-08-30 DIAGNOSIS — H02834 Dermatochalasis of left upper eyelid: Secondary | ICD-10-CM | POA: Diagnosis not present

## 2022-08-30 DIAGNOSIS — H02403 Unspecified ptosis of bilateral eyelids: Secondary | ICD-10-CM | POA: Diagnosis not present

## 2022-08-30 DIAGNOSIS — Z961 Presence of intraocular lens: Secondary | ICD-10-CM | POA: Diagnosis not present

## 2022-08-30 DIAGNOSIS — H353211 Exudative age-related macular degeneration, right eye, with active choroidal neovascularization: Secondary | ICD-10-CM | POA: Diagnosis not present

## 2022-08-30 DIAGNOSIS — H43813 Vitreous degeneration, bilateral: Secondary | ICD-10-CM | POA: Diagnosis not present

## 2022-08-30 DIAGNOSIS — H02831 Dermatochalasis of right upper eyelid: Secondary | ICD-10-CM | POA: Diagnosis not present

## 2022-08-30 DIAGNOSIS — H353231 Exudative age-related macular degeneration, bilateral, with active choroidal neovascularization: Secondary | ICD-10-CM | POA: Diagnosis not present

## 2022-08-30 DIAGNOSIS — H353221 Exudative age-related macular degeneration, left eye, with active choroidal neovascularization: Secondary | ICD-10-CM | POA: Diagnosis not present

## 2022-09-04 DIAGNOSIS — R7989 Other specified abnormal findings of blood chemistry: Secondary | ICD-10-CM | POA: Diagnosis not present

## 2022-09-04 DIAGNOSIS — E7800 Pure hypercholesterolemia, unspecified: Secondary | ICD-10-CM | POA: Diagnosis not present

## 2022-09-04 DIAGNOSIS — I119 Hypertensive heart disease without heart failure: Secondary | ICD-10-CM | POA: Diagnosis not present

## 2022-09-04 DIAGNOSIS — R7302 Impaired glucose tolerance (oral): Secondary | ICD-10-CM | POA: Diagnosis not present

## 2022-09-04 LAB — LAB REPORT - SCANNED: EGFR: 63.9

## 2022-09-04 LAB — HEMOGLOBIN A1C: A1c: 5.6

## 2022-09-11 DIAGNOSIS — R82998 Other abnormal findings in urine: Secondary | ICD-10-CM | POA: Diagnosis not present

## 2022-09-11 DIAGNOSIS — I872 Venous insufficiency (chronic) (peripheral): Secondary | ICD-10-CM | POA: Diagnosis not present

## 2022-09-11 DIAGNOSIS — I129 Hypertensive chronic kidney disease with stage 1 through stage 4 chronic kidney disease, or unspecified chronic kidney disease: Secondary | ICD-10-CM | POA: Diagnosis not present

## 2022-09-11 DIAGNOSIS — E7800 Pure hypercholesterolemia, unspecified: Secondary | ICD-10-CM | POA: Diagnosis not present

## 2022-09-11 DIAGNOSIS — I252 Old myocardial infarction: Secondary | ICD-10-CM | POA: Diagnosis not present

## 2022-09-11 DIAGNOSIS — R7302 Impaired glucose tolerance (oral): Secondary | ICD-10-CM | POA: Diagnosis not present

## 2022-09-11 DIAGNOSIS — I2510 Atherosclerotic heart disease of native coronary artery without angina pectoris: Secondary | ICD-10-CM | POA: Diagnosis not present

## 2022-09-11 DIAGNOSIS — I679 Cerebrovascular disease, unspecified: Secondary | ICD-10-CM | POA: Diagnosis not present

## 2022-09-11 DIAGNOSIS — Z1331 Encounter for screening for depression: Secondary | ICD-10-CM | POA: Diagnosis not present

## 2022-09-11 DIAGNOSIS — N1831 Chronic kidney disease, stage 3a: Secondary | ICD-10-CM | POA: Diagnosis not present

## 2022-09-11 DIAGNOSIS — E663 Overweight: Secondary | ICD-10-CM | POA: Diagnosis not present

## 2022-09-11 DIAGNOSIS — Z1339 Encounter for screening examination for other mental health and behavioral disorders: Secondary | ICD-10-CM | POA: Diagnosis not present

## 2022-09-11 DIAGNOSIS — Z0000 Encounter for general adult medical examination without abnormal findings: Secondary | ICD-10-CM | POA: Diagnosis not present

## 2022-09-15 NOTE — Progress Notes (Unsigned)
[name] Date of Birth: 24-Jun-1940 Medical Record #[id number]  History of Present Illness: [name] is seen today for followup CAD. He has a known history of coronary disease with a myocardial infarction in 2007. Cardiac catheterization at that time demonstrated occlusion of the mid left circumflex coronary. He had a 50-70% stenosis in the mid LAD that was eccentric. He had a Myoview study in January 2018 that showed a defect in the inferior lateral wall. This was unchanged compared to prior studies.  He has a history of a "mini stroke"  November 2015 with swallowing difficulty. MRI showed no acute change but there were chronic ischemic changes in the right parietal lobe that were new since 2013. He was switched from ASA to Plavix.   In July 2020 he underwent left hemicolectomy for a colon mass that was benign.   On follow up today he is doing very well. He denies any chest pain or SOB. He continues to walk daily for 55miles/week. 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 BP readings at home have been normal. No palpitations or edema. He has lost weight - down 4 lbs.   Current Outpatient Medications on File Prior to Visit  Medication Sig Dispense Refill   clopidogrel (PLAVIX) 75 MG tablet      FEROSUL 325 (65 Fe) MG tablet      metoprolol succinate (TOPROL-XL) 50 MG 24 hr tablet TAKE 1 TABLET EVERY DAY 90 tablet 3   Multiple Vitamin (MULTIVITAMIN) tablet Take 1 tablet by mouth daily.     ramipril (ALTACE) 10 MG capsule TAKE 1 CAPSULE EVERY DAY 90 capsule 3   simvastatin (ZOCOR) 80 MG tablet Take 1 tablet (80 mg total) by mouth daily at 6 PM. Patient must attend upcoming appointment for future refills 90 tablet 0   No current facility-administered medications on file prior to visit.    Allergies  Allergen Reactions   Unasyn [Ampicillin-Sulbactam Sodium] Rash    Past Medical History:  Diagnosis Date   Acute cholecystitis 02/14/2012   Anginal pain (HCC)    CAD (coronary artery disease)  2007   occlusion of mid LCX, 50-70% LAD   Cataract 2010   bilateral eyes   Choledocholithiasis 02/14/2012   Dyslipidemia    HTN (hypertension)    Macular degeneration    Myocardial infarction (HCC) 1/07   Acute non-ST-elevation myocardial infarction   Overweight(278.02)    with body mass index of 29   TIA (transient ischemic attack)    PER PATIENT , HIS EYE DOCTOR SAW ABNORMALILTY DURING EYE EXAM . TOLD HIM HE HAD A SMALL STROKE  AND THAT WAS THE CAUSE OF A C/O DYSPHAGIA   AT THE TIME, NO RECURRENCE  SINCE THAT TIME, DYSPHAGIA  NOW SINCE IMPROVED  WITH AVOIDANCE OF STRIGNY FOODS     Past Surgical History:  Procedure Laterality Date   CARDIAC CATHETERIZATION  03/17/2005   Ejection fraction is estimated at 55%   CATARACT EXTRACTION W/ INTRAOCULAR LENS  IMPLANT, BILATERAL  ~ 2010   CHOLECYSTECTOMY  02/15/2012   Procedure: LAPAROSCOPIC CHOLECYSTECTOMY WITH INTRAOPERATIVE CHOLANGIOGRAM;  Surgeon: Almond Lint, MD;  Location: [facility];  Service: General;  Laterality: N/A;   KNEE SURGERY  1980's   "took knee out and put it back in" ; left    LAPAROSCOPIC SIGMOID COLECTOMY Left 09/17/2018   Procedure: LAPAROSCOPIC LEFT HEMICOLECTOMY WITH MOBILIZATION OF SPLENNIC FLEXURE;  Surgeon: Ovidio Kin, MD;  Location: [facility];  Service: General;  Laterality: Left;    Social History  Tobacco Use  Smoking Status Never  Smokeless Tobacco Never    Social History   Substance and Sexual Activity  Alcohol Use No   Comment: 12/05/2011 "stopped all alcohol > 25 years ago; never drank heavily"    Family History  Problem Relation Age of Onset   Heart disease Father    Cancer Mother        breast   Stomach cancer Mother    Cancer Maternal Aunt        breast   Esophageal cancer Neg Hx    Rectal cancer Neg Hx    Colon cancer Neg Hx    Colon polyps Neg Hx     Review of Systems: As noted in history of present illness.  All other systems were reviewed and are negative.  Physical Exam: BP (!)  146/60   Pulse 76   Ht 6\' 2"  (1.88 m)   Wt 194 lb (88 kg)   SpO2 97%   BMI 24.91 kg/m  GENERAL:  Well appearing WM in NAD HEENT:  PERRL, EOMI, sclera are clear. Oropharynx is clear. NECK:  No jugular venous distention, carotid upstroke brisk and symmetric, no bruits, no thyromegaly or adenopathy LUNGS:  Clear to auscultation bilaterally CHEST:  Unremarkable HEART:  RRR,  PMI not displaced or sustained,S1 and S2 within normal limits, no S3, no S4: no clicks, no rubs, no murmurs ABD:  Soft, nontender. BS +, no masses or bruits. No hepatomegaly, no splenomegaly EXT:  2 + pulses throughout, no edema, no cyanosis no clubbing SKIN:  Warm and dry.  No rashes NEURO:  Alert and oriented x 3. Cranial nerves II through XII intact. PSYCH:  Cognitively intact    LABORATORY DATA: EKG Interpretation Date/Time:  Tuesday September 19 2022 10:07:11 EDT Ventricular Rate:  76 PR Interval:  208 QRS Duration:  104 QT Interval:  394 QTC Calculation: 443 R Axis:   23  Text Interpretation: Sinus rhythm with occasional Premature ventricular complexes When compared with ECG of 14-Feb-2012 01:42, Premature ventricular complexes are now Present ECG OTHERWISE WITHIN NORMAL LIMITS Confirmed by [place], Felis Quillin [phone]) on 09/19/2022 10:10:35 AM    Labs dated 07/02/15: Cholesterol 122, triglycerides 101, HDL 35, LDL 67 Dated 01/12/16: A1c 1.9%, CMET and CBC normal. Dated 07/06/16: cholesterol 120, triglycerides 78, HDL 40, LDL 64. Hgb  16.2 Dated 01/23/17: A1c 5.4%. Normal chemistries. Dated 02/04/18: cholesterol 126, triglycerides 117, HDL 42, LDL 61. A1c 5.5%. Creatinine 1.1. Hgb and ALT normal Dated 07/19/18: cholesterol 121, triglycerides 92, HDL 35, LDL 68 Dated 01/28/19: Hgb 10.5. CMET normal. A1c 5.7%.  Dated 07/23/19: cholesterol 117, triglycerides 122, HDL 34, LDL 59. A1c 5.7%. creatinine 1.3. Hgb 11.4. plts 433K. Otherwise CMET and CBC normal. Dated 07/28/20: cholesterol 137, triglycerides 99. HDL 36, LDL  81. Dated 02/28/21: A1c 5.6%. LFTs normal. GFR 64.  Dated 09/04/22: cholesterol 127, triglycerides 108, HDL 35, LDL 70. A1c 5.6%. CMET normal.   Myoview 04/19/16: Study Highlights   The left ventricular ejection fraction is normal (55-65%). Nuclear stress EF: 59%. There was no ST segment deviation noted during stress. No T wave inversion was noted during stress. Blood pressure demonstrated a normal response to exercise. Defect 1: There is a small defect of severe severity present in the basal inferolateral and mid inferolateral location. Findings consistent with prior myocardial infarction with peri-infarct ischemia. This is an intermediate risk study.    Assessment / Plan:  1. Coronary disease with history of occlusion of the mid left circumflex coronary  in 2007. He is asymptomatic. Myoview study in June of 2018 was unchanged from prior studies with fixed inferolateral defect. Continue current therapy with Plavix, statin, and beta blocker therapy.   2. Hypertension-well controlled per home readings.  Continue ACE inhibitor and beta blocker therapy.  3. Dyslipidemia patient is on high-dose simvastatin  therapy. Last LDL 70.    4. CVA  on Plavx. No recurrence  Follow up in one year.

## 2022-09-19 ENCOUNTER — Encounter: Payer: Self-pay | Admitting: Cardiology

## 2022-09-19 ENCOUNTER — Ambulatory Visit: Payer: Medicare Other | Attending: Cardiology | Admitting: Cardiology

## 2022-09-19 VITALS — BP 146/60 | HR 76 | Ht 74.0 in | Wt 194.0 lb

## 2022-09-19 DIAGNOSIS — I10 Essential (primary) hypertension: Secondary | ICD-10-CM | POA: Insufficient documentation

## 2022-09-19 DIAGNOSIS — E785 Hyperlipidemia, unspecified: Secondary | ICD-10-CM | POA: Insufficient documentation

## 2022-09-19 DIAGNOSIS — I2510 Atherosclerotic heart disease of native coronary artery without angina pectoris: Secondary | ICD-10-CM | POA: Insufficient documentation

## 2022-09-19 MED ORDER — SIMVASTATIN 80 MG PO TABS: 80.0000 mg | ORAL_TABLET | Freq: Every day | ORAL | 3 refills | Status: DC

## 2022-09-19 NOTE — Patient Instructions (Signed)
Medication Instructions:  Continue same medications *If you need a refill on your cardiac medications before your next appointment, please call your pharmacy*   Lab Work: None ordered   Testing/Procedures: None ordered   Follow-Up: At [facility], you and your health needs are our priority.  As part of our continuing mission to provide you with exceptional heart care, we have created designated Provider Care Teams.  These Care Teams include your primary Cardiologist (physician) and Advanced Practice Providers (APPs -  Physician Assistants and Nurse Practitioners) who all work together to provide you with the care you need, when you need it.  We recommend signing up for the patient portal called "MyChart".  Sign up information is provided on this After Visit Summary.  MyChart is used to connect with patients for Virtual Visits (Telemedicine).  Patients are able to view lab/test results, encounter notes, upcoming appointments, etc.  Non-urgent messages can be sent to your provider as well.   To learn more about what you can do with MyChart, go to [email or web address].    Your next appointment:  1 year   Call in March to schedule July appointment     Provider:  Dr.Jordan   

## 2022-10-09 ENCOUNTER — Other Ambulatory Visit: Payer: Self-pay | Admitting: Cardiology

## 2022-10-25 ENCOUNTER — Other Ambulatory Visit: Payer: Self-pay | Admitting: Cardiology

## 2022-11-29 DIAGNOSIS — Z961 Presence of intraocular lens: Secondary | ICD-10-CM | POA: Diagnosis not present

## 2022-11-29 DIAGNOSIS — H353211 Exudative age-related macular degeneration, right eye, with active choroidal neovascularization: Secondary | ICD-10-CM | POA: Diagnosis not present

## 2022-11-29 DIAGNOSIS — H02403 Unspecified ptosis of bilateral eyelids: Secondary | ICD-10-CM | POA: Diagnosis not present

## 2022-11-29 DIAGNOSIS — H18519 Endothelial corneal dystrophy, unspecified eye: Secondary | ICD-10-CM | POA: Diagnosis not present

## 2022-11-29 DIAGNOSIS — H43813 Vitreous degeneration, bilateral: Secondary | ICD-10-CM | POA: Diagnosis not present

## 2022-12-03 ENCOUNTER — Emergency Department (HOSPITAL_COMMUNITY): Payer: Medicare Other

## 2022-12-03 ENCOUNTER — Other Ambulatory Visit: Payer: Self-pay

## 2022-12-03 ENCOUNTER — Emergency Department (HOSPITAL_COMMUNITY)
Admission: EM | Admit: 2022-12-03 | Discharge: 2022-12-03 | Disposition: A | Discharge status: Home / Self Care | Payer: Medicare Other | Attending: Emergency Medicine | Admitting: Emergency Medicine

## 2022-12-03 ENCOUNTER — Encounter (HOSPITAL_COMMUNITY): Payer: Self-pay

## 2022-12-03 DIAGNOSIS — K5730 Diverticulosis of large intestine without perforation or abscess without bleeding: Secondary | ICD-10-CM | POA: Diagnosis not present

## 2022-12-03 DIAGNOSIS — W19XXXA Unspecified fall, initial encounter: Secondary | ICD-10-CM | POA: Insufficient documentation

## 2022-12-03 DIAGNOSIS — I10 Essential (primary) hypertension: Secondary | ICD-10-CM | POA: Insufficient documentation

## 2022-12-03 DIAGNOSIS — I2510 Atherosclerotic heart disease of native coronary artery without angina pectoris: Secondary | ICD-10-CM | POA: Diagnosis not present

## 2022-12-03 DIAGNOSIS — S3992XA Unspecified injury of lower back, initial encounter: Secondary | ICD-10-CM | POA: Insufficient documentation

## 2022-12-03 DIAGNOSIS — Z79899 Other long term (current) drug therapy: Secondary | ICD-10-CM | POA: Insufficient documentation

## 2022-12-03 DIAGNOSIS — M4310 Spondylolisthesis, site unspecified: Secondary | ICD-10-CM | POA: Diagnosis not present

## 2022-12-03 DIAGNOSIS — K4090 Unilateral inguinal hernia, without obstruction or gangrene, not specified as recurrent: Secondary | ICD-10-CM | POA: Diagnosis not present

## 2022-12-03 MED ORDER — LIDOCAINE 5 % EX PTCH
1.0000 | MEDICATED_PATCH | CUTANEOUS | Status: DC
  Administered 2022-12-03: 1 via TRANSDERMAL
  Filled 2022-12-03: qty 1

## 2022-12-03 MED ORDER — ACETAMINOPHEN 500 MG PO TABS
1000.0000 mg | ORAL_TABLET | Freq: Once | ORAL | Status: AC
  Administered 2022-12-03: 1000 mg via ORAL
  Filled 2022-12-03: qty 2

## 2022-12-03 NOTE — ED Provider Notes (Signed)
[place] EMERGENCY DEPARTMENT AT [facility] Provider Note   CSN: [id number] Arrival date & time: 12/03/22  6962     History  Chief Complaint  Patient presents with   Marletta Lor    [name] is a 82 y.o. male past with history of CAD, hypertension, TIA presents today for evaluation after fall.  Patient was pulling his bed sheet on Tuesday, tripped, fell backwards and sat on the floor.  Patient complains of tailbone pain since.  He denies any his head, LOC, vision changes, nausea vomiting, headache, lightheadedness, dizziness.  Pain is worse when he changes positions.  Patient has not tried any medication for pain at home.  He denies any urinary symptoms, urinary retention, bowel incontinence, saddle anesthesia, distal weakness.   Fall    Past Medical History:  Diagnosis Date   Acute cholecystitis 02/14/2012   Anginal pain (HCC)    CAD (coronary artery disease) 2007   occlusion of mid LCX, 50-70% LAD   Cataract 2010   bilateral eyes   Choledocholithiasis 02/14/2012   Dyslipidemia    HTN (hypertension)    Macular degeneration    Myocardial infarction (HCC) 1/07   Acute non-ST-elevation myocardial infarction   Overweight(278.02)    with body mass index of 29   TIA (transient ischemic attack)    PER PATIENT , HIS EYE DOCTOR SAW ABNORMALILTY DURING EYE EXAM . TOLD HIM HE HAD A SMALL STROKE  AND THAT WAS THE CAUSE OF A C/O DYSPHAGIA   AT THE TIME, NO RECURRENCE  SINCE THAT TIME, DYSPHAGIA  NOW SINCE IMPROVED  WITH AVOIDANCE OF STRIGNY FOODS    Past Surgical History:  Procedure Laterality Date   CARDIAC CATHETERIZATION  03/17/2005   Ejection fraction is estimated at 55%   CATARACT EXTRACTION W/ INTRAOCULAR LENS  IMPLANT, BILATERAL  ~ 2010   CHOLECYSTECTOMY  02/15/2012   Procedure: LAPAROSCOPIC CHOLECYSTECTOMY WITH INTRAOPERATIVE CHOLANGIOGRAM;  Surgeon: Almond Lint, MD;  Location: [facility] OR;  Service: General;  Laterality: N/A;   KNEE SURGERY  1980's   "took knee out  and put it back in" ; left    LAPAROSCOPIC SIGMOID COLECTOMY Left 09/17/2018   Procedure: LAPAROSCOPIC LEFT HEMICOLECTOMY WITH MOBILIZATION OF SPLENNIC FLEXURE;  Surgeon: Ovidio Kin, MD;  Location: [facility];  Service: General;  Laterality: Left;     Home Medications Prior to Admission medications   Medication Sig Start Date End Date Taking? Authorizing Provider  clopidogrel (PLAVIX) 75 MG tablet  03/14/19   [provider]  FEROSUL 325 (65 Fe) MG tablet  04/09/19   [provider]  metoprolol succinate (TOPROL-XL) 50 MG 24 hr tablet TAKE 1 TABLET EVERY DAY 05/25/22   [place], Peter M, MD  Multiple Vitamin (MULTIVITAMIN) tablet Take 1 tablet by mouth daily.    [provider]  ramipril (ALTACE) 10 MG capsule TAKE 1 CAPSULE EVERY DAY 10/25/22   [place], Peter M, MD  simvastatin (ZOCOR) 80 MG tablet TAKE 1 TABLET EVERY DAY AT 6PM (NEED MD APPOINTMENT FOR REFILLS) 10/11/22   [place], Peter M, MD      Allergies    Unasyn [ampicillin-sulbactam sodium]    Review of Systems   Review of Systems Negative except as per HPI.  Physical Exam Updated Vital Signs BP 138/74   Pulse 70   Temp 97.9 F (36.6 C) (Oral)   Resp 17   Ht 6\' 2"  (1.88 m)   Wt 86.2 kg   SpO2 99%   BMI 24.39 kg/m  Physical Exam Vitals and nursing note reviewed.  Constitutional:      Appearance: Normal appearance.  HENT:     Head: Normocephalic and atraumatic.     Mouth/Throat:     Mouth: Mucous membranes are moist.  Eyes:     General: No scleral icterus. Cardiovascular:     Rate and Rhythm: Normal rate and regular rhythm.     Pulses: Normal pulses.     Heart sounds: Normal heart sounds.  Pulmonary:     Effort: Pulmonary effort is normal.     Breath sounds: Normal breath sounds.  Abdominal:     General: Abdomen is flat.     Palpations: Abdomen is soft.     Tenderness: There is no abdominal tenderness.  Musculoskeletal:        General: No deformity.     Comments: TTP to the sacrum/coccyx  area.  Skin:    General: Skin is warm.     Findings: No rash.  Neurological:     General: No focal deficit present.     Mental Status: He is alert.  Psychiatric:        Mood and Affect: Mood normal.     ED Results / Procedures / Treatments   Labs (all labs ordered are listed, but only abnormal results are displayed) Labs Reviewed - No data to display  EKG None  Radiology CT PELVIS WO CONTRAST  Result Date: 12/03/2022 CLINICAL DATA:  Coccygeal displacement.  Fall. EXAM: CT PELVIS WITHOUT CONTRAST TECHNIQUE: Multidetector CT imaging of the pelvis was performed following the standard protocol without intravenous contrast. RADIATION DOSE REDUCTION: This exam was performed according to the departmental dose-optimization program which includes automated exposure control, adjustment of the mA and/or kV according to patient size and/or use of iterative reconstruction technique. COMPARISON:  Abdominopelvic CT 02/14/2012 FINDINGS: Urinary Tract: Visualized ureters are normal. Couple tiny left lateral bladder diverticula are present. Bowel: Moderate diverticulosis of the colon particularly over the sigmoid colon and distal descending colon. Appendix is normal. Visualized small bowel is unremarkable. Vascular/Lymphatic: Calcified plaque over the distal abdominal aorta which is normal in caliber. No adenopathy. Reproductive:  Normal. Other:  Small left inguinal hernia containing only peritoneal fat Musculoskeletal: Examination demonstrates possible subtle fracture of the coccyx. There is subtle 1-2 mm posterior subluxation of the coccygeal segment immediately inferior to this possible fracture site. This area is not well evaluated on the previous exam. There is subtle stranding of the adjacent presacral fat as these findings are likely acute, although may be chronic. Degenerative changes of the spine and hips. IMPRESSION: 1. Possible subtle nondisplaced fracture of the distal coccyx with subtle 1-2 mm  posterior subluxation of the coccygeal segment immediately inferior to this possible fracture site. These findings may be acute or chronic as the subtle stranding of the adjacent presacral fat suggests these findings are more likely acute. 2. Moderate diverticulosis of the colon. 3. Small left inguinal hernia containing only peritoneal fat. 4. Aortic atherosclerosis. Aortic Atherosclerosis (ICD10-I70.0). Electronically Signed   By: Elberta Fortis M.D.   On: 12/03/2022 09:13   DG Sacrum/Coccyx  Result Date: 12/03/2022 CLINICAL DATA:  82 year old male with history of trauma from a fall. EXAM: SACRUM AND COCCYX - 2+ VIEW COMPARISON:  No priors. FINDINGS: Three views of the sacrum and coccyx demonstrate no definite acute displaced fractures. The last coccygeal segment appears 2 mm dorsally displaced, which could be acute or chronic. 6 mm of anterolisthesis of L5 upon S1 is also noted, which is  also likely chronic. IMPRESSION: 1. No acute displaced fracture of the sacrum or coccyx. 2. 2 mm of dorsal displacement of the distal coccygeal segment, potentially acute or chronic. Electronically Signed   By: Trudie Reed M.D.   On: 12/03/2022 07:58    Procedures Procedures    Medications Ordered in ED Medications  acetaminophen (TYLENOL) tablet 1,000 mg (1,000 mg Oral Given 12/03/22 0755)    ED Course/ Medical Decision Making/ A&P                                 Medical Decision Making Amount and/or Complexity of Data Reviewed Radiology: ordered.  Risk OTC drugs.   This patient presents to the ED for abdominal pain, this involves an extensive number of treatment options, and is a complaint that carries with a high risk of complications and morbidity.  The differential diagnosis includes fracture, dislocation, ligamentous injury.  This is not an exhaustive list.   Imaging studies: I ordered imaging studies, personally reviewed, interpreted imaging and agree with the radiologist's interpretations.  The results include: X-ray show 2 mm of dorsal displacement of the distal coccygeal segment.  CT pelvis show possible subtle nondisplaced fracture of the distal coccyx with subtle posterior subluxations of the coccygeal segment.  Problem list/ ED course/ Critical interventions/ Medical management: HPI: See above Vital signs within normal range and stable throughout visit. Laboratory/imaging studies significant for: See above. On physical examination, patient is afebrile and appears in no acute distress.  Patient has pain upon sitting and changing position.  However states the pain is minimal.  He was monitored in the ER for 3 hours and was able to sit without much discomfort.  Advised patient to use cushion or donut pillow for support when sitting.  Follow-up with orthopedics for reevaluation and management.  Strict ED return precaution discussed. I have reviewed the patient home medicines and have made adjustments as needed.  Cardiac monitoring/EKG: The patient was maintained on a cardiac monitor.  I personally reviewed and interpreted the cardiac monitor which showed an underlying rhythm of: sinus rhythm.  Additional history obtained: External records from outside source obtained and reviewed including: Chart review including previous notes, labs, imaging.  Consultations obtained:  Disposition Continued outpatient therapy. Follow-up with PCP or orthopedics recommended for reevaluation of symptoms. Treatment plan discussed with patient.  Pt acknowledged understanding was agreeable to the plan. Worrisome signs and symptoms were discussed with patient, and patient acknowledged understanding to return to the ED if they noticed these signs and symptoms. Patient was stable upon discharge.   This chart was dictated using voice recognition software.  Despite best efforts to proofread,  errors can occur which can change the documentation meaning.          Final Clinical Impression(s) / ED  Diagnoses Final diagnoses:  Injury of coccyx, initial encounter    Rx / DC Orders ED Discharge Orders     None         Jeanelle Malling, [place] 12/03/22 1949    Loetta Rough, MD 12/06/22 [phone]

## 2022-12-03 NOTE — ED Triage Notes (Signed)
Pt arrives POV after he fell on Tuesday this week. Pt states that he was making the bed and tripped backwards and just sat on the ground and has had tailbone pain since. Denies hitting head and other injury at this time. Pain gets better when walking or sitting on hard surface, but when sitting on a cushion, pain worsens.

## 2022-12-03 NOTE — Discharge Instructions (Addendum)
Please take tylenol for pain. Use a cushion or donut pillow for support when sitting. I recommend close follow-up with PCP or orthopedics for reevaluation.  Please do not hesitate to return to emergency department if worrisome signs symptoms we discussed become apparent.

## 2023-01-04 ENCOUNTER — Telehealth: Payer: Self-pay

## 2023-01-04 NOTE — Telephone Encounter (Signed)
Transition Care Management Follow-up Telephone Call Date of discharge and from where: 12/03/2022 [facility] How have you been since you were released from the hospital? Patient stated he is feeling much better. Any questions or concerns? No  Items Reviewed: Did the pt receive and understand the discharge instructions provided? Yes  Medications obtained and verified?  No medication prescribed, OTC Tylenol. Other? No  Any new allergies since your discharge? No  Dietary orders reviewed? Yes Do you have support at home? Yes   Follow up appointments reviewed:  PCP Hospital f/u appt confirmed? Yes  Scheduled to see Guerry Bruin, MD on 12/04/2022 @ [facility]. Specialist Hospital f/u appt confirmed? No  Scheduled to see  on  @ . Are transportation arrangements needed? Yes  If their condition worsens, is the pt aware to call PCP or go to the Emergency Dept.? Yes Was the patient provided with contact information for the PCP's office or ED? Yes Was to pt encouraged to call back with questions or concerns? Yes   Jahvon Gosline [facility]  [facility] Guide Direct Dial: [phone]  Website: [email or web address]

## 2023-03-20 DIAGNOSIS — R7302 Impaired glucose tolerance (oral): Secondary | ICD-10-CM | POA: Diagnosis not present

## 2023-03-20 DIAGNOSIS — H353232 Exudative age-related macular degeneration, bilateral, with inactive choroidal neovascularization: Secondary | ICD-10-CM | POA: Diagnosis not present

## 2023-03-20 DIAGNOSIS — E663 Overweight: Secondary | ICD-10-CM | POA: Diagnosis not present

## 2023-03-20 DIAGNOSIS — Z23 Encounter for immunization: Secondary | ICD-10-CM | POA: Diagnosis not present

## 2023-03-20 DIAGNOSIS — I1310 Hypertensive heart and chronic kidney disease without heart failure, with stage 1 through stage 4 chronic kidney disease, or unspecified chronic kidney disease: Secondary | ICD-10-CM | POA: Diagnosis not present

## 2023-03-20 DIAGNOSIS — N1831 Chronic kidney disease, stage 3a: Secondary | ICD-10-CM | POA: Diagnosis not present

## 2023-03-20 DIAGNOSIS — I2510 Atherosclerotic heart disease of native coronary artery without angina pectoris: Secondary | ICD-10-CM | POA: Diagnosis not present

## 2023-03-20 DIAGNOSIS — E7800 Pure hypercholesterolemia, unspecified: Secondary | ICD-10-CM | POA: Diagnosis not present

## 2023-03-20 DIAGNOSIS — Z9049 Acquired absence of other specified parts of digestive tract: Secondary | ICD-10-CM | POA: Diagnosis not present

## 2023-03-20 DIAGNOSIS — I872 Venous insufficiency (chronic) (peripheral): Secondary | ICD-10-CM | POA: Diagnosis not present

## 2023-03-28 DIAGNOSIS — H02403 Unspecified ptosis of bilateral eyelids: Secondary | ICD-10-CM | POA: Diagnosis not present

## 2023-03-28 DIAGNOSIS — Z961 Presence of intraocular lens: Secondary | ICD-10-CM | POA: Diagnosis not present

## 2023-03-28 DIAGNOSIS — H353232 Exudative age-related macular degeneration, bilateral, with inactive choroidal neovascularization: Secondary | ICD-10-CM | POA: Diagnosis not present

## 2023-03-30 ENCOUNTER — Other Ambulatory Visit: Payer: Self-pay | Admitting: Cardiology

## 2023-08-13 ENCOUNTER — Other Ambulatory Visit: Payer: Self-pay | Admitting: Cardiology

## 2023-08-29 DIAGNOSIS — H02403 Unspecified ptosis of bilateral eyelids: Secondary | ICD-10-CM | POA: Diagnosis not present

## 2023-08-29 DIAGNOSIS — H353211 Exudative age-related macular degeneration, right eye, with active choroidal neovascularization: Secondary | ICD-10-CM | POA: Diagnosis not present

## 2023-08-29 DIAGNOSIS — H43813 Vitreous degeneration, bilateral: Secondary | ICD-10-CM | POA: Diagnosis not present

## 2023-08-29 DIAGNOSIS — Z961 Presence of intraocular lens: Secondary | ICD-10-CM | POA: Diagnosis not present

## 2023-08-29 DIAGNOSIS — H353232 Exudative age-related macular degeneration, bilateral, with inactive choroidal neovascularization: Secondary | ICD-10-CM | POA: Diagnosis not present

## 2023-09-03 ENCOUNTER — Other Ambulatory Visit: Payer: Self-pay | Admitting: Cardiology

## 2023-09-10 DIAGNOSIS — I129 Hypertensive chronic kidney disease with stage 1 through stage 4 chronic kidney disease, or unspecified chronic kidney disease: Secondary | ICD-10-CM | POA: Diagnosis not present

## 2023-09-10 DIAGNOSIS — N1831 Chronic kidney disease, stage 3a: Secondary | ICD-10-CM | POA: Diagnosis not present

## 2023-09-10 DIAGNOSIS — R7302 Impaired glucose tolerance (oral): Secondary | ICD-10-CM | POA: Diagnosis not present

## 2023-09-10 DIAGNOSIS — I2510 Atherosclerotic heart disease of native coronary artery without angina pectoris: Secondary | ICD-10-CM | POA: Diagnosis not present

## 2023-09-10 DIAGNOSIS — E7849 Other hyperlipidemia: Secondary | ICD-10-CM | POA: Diagnosis not present

## 2023-09-10 DIAGNOSIS — E7800 Pure hypercholesterolemia, unspecified: Secondary | ICD-10-CM | POA: Diagnosis not present

## 2023-09-10 DIAGNOSIS — Z125 Encounter for screening for malignant neoplasm of prostate: Secondary | ICD-10-CM | POA: Diagnosis not present

## 2023-09-16 NOTE — Progress Notes (Unsigned)
 [name]: 07-04-1940 Medical Record #[id number]  History of Present Illness: Mr. Klippel is seen today for followup CAD. He has a known history of coronary disease with a myocardial infarction in 2007. Cardiac catheterization at that time demonstrated occlusion of the mid left circumflex coronary. He had a 50-70% stenosis in the mid LAD that was eccentric. He had a Myoview  study in January 2018 that showed a defect in the inferior lateral wall. This was unchanged compared to prior studies.  He has a history of a mini stroke  November 2015 with swallowing difficulty. MRI showed no acute change but there were chronic ischemic changes in the right parietal lobe that were new since 2013. He was switched from ASA to Plavix .   In July 2020 he underwent left hemicolectomy for a colon mass that was benign.   On follow up today he is doing very well. He denies any chest pain or SOB. He continues to walk daily for 50miles/week. 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 BP readings at home have been normal. No palpitations or edema. He has lost weight - down 4 lbs.   Current Outpatient Medications on File Prior to Visit  Medication Sig Dispense Refill   clopidogrel  (PLAVIX ) 75 MG tablet      FEROSUL 325 (65 Fe) MG tablet      metoprolol  succinate (TOPROL -XL) 50 MG 24 hr tablet TAKE 1 TABLET EVERY DAY 90 tablet 3   Multiple Vitamin (MULTIVITAMIN) tablet Take 1 tablet by mouth daily.     ramipril  (ALTACE ) 10 MG capsule TAKE 1 CAPSULE EVERY DAY 30 capsule 0   simvastatin  (ZOCOR ) 80 MG tablet Take 1 tablet (80 mg total) by mouth daily at 6 PM. 90 tablet 0   No current facility-administered medications on file prior to visit.    Allergies  Allergen Reactions   Unasyn  [Ampicillin -Sulbactam Sodium ] Rash    Past Medical History:  Diagnosis Date   Acute cholecystitis 02/14/2012   Anginal pain (HCC)    CAD (coronary artery disease) 2007   occlusion of mid LCX, 50-70% LAD   Cataract 2010    bilateral eyes   Choledocholithiasis 02/14/2012   Dyslipidemia    HTN (hypertension)    Macular degeneration    Myocardial infarction (HCC) 1/07   Acute non-ST-elevation myocardial infarction   Overweight(278.02)    with body mass index of 29   TIA (transient ischemic attack)    PER PATIENT , HIS EYE DOCTOR SAW ABNORMALILTY DURING EYE EXAM . TOLD HIM HE HAD A SMALL STROKE  AND THAT WAS THE CAUSE OF A C/O DYSPHAGIA   AT THE TIME, NO RECURRENCE  SINCE THAT TIME, DYSPHAGIA  NOW SINCE IMPROVED  WITH AVOIDANCE OF STRIGNY FOODS     Past Surgical History:  Procedure Laterality Date   CARDIAC CATHETERIZATION  03/17/2005   Ejection fraction is estimated at 55%   CATARACT EXTRACTION W/ INTRAOCULAR LENS  IMPLANT, BILATERAL  ~ 2010   CHOLECYSTECTOMY  02/15/2012   Procedure: LAPAROSCOPIC CHOLECYSTECTOMY WITH INTRAOPERATIVE CHOLANGIOGRAM;  Surgeon: Jina Nephew, MD;  Location: [facility] OR;  Service: General;  Laterality: N/A;   KNEE SURGERY  1980's   took knee out and put it back in ; left    LAPAROSCOPIC SIGMOID COLECTOMY Left 09/17/2018   Procedure: LAPAROSCOPIC LEFT HEMICOLECTOMY WITH MOBILIZATION OF SPLENNIC FLEXURE;  Surgeon: Ethyl Lenis, MD;  Location: [facility];  Service: General;  Laterality: Left;    Social History   Tobacco Use  Smoking Status Never  Smokeless Tobacco Never    Social History   Substance and Sexual Activity  Alcohol  Use No   Comment: 12/05/2011 stopped all alcohol  > 25 years ago; never drank heavily    Family History  Problem Relation Age of Onset   Heart disease Father    Cancer Mother        breast   Stomach cancer Mother    Cancer Maternal Aunt        breast   Esophageal cancer Neg Hx    Rectal cancer Neg Hx    Colon cancer Neg Hx    Colon polyps Neg Hx     Review of Systems: As noted in history of present illness.  All other systems were reviewed and are negative.  Physical Exam: There were no vitals taken for this visit. GENERAL:  Well appearing WM  in NAD HEENT:  PERRL, EOMI, sclera are clear. Oropharynx is clear. NECK:  No jugular venous distention, carotid upstroke brisk and symmetric, no bruits, no thyromegaly or adenopathy LUNGS:  Clear to auscultation bilaterally CHEST:  Unremarkable HEART:  RRR,  PMI not displaced or sustained,S1 and S2 within normal limits, no S3, no S4: no clicks, no rubs, no murmurs ABD:  Soft, nontender. BS +, no masses or bruits. No hepatomegaly, no splenomegaly EXT:  2 + pulses throughout, no edema, no cyanosis no clubbing SKIN:  Warm and dry.  No rashes NEURO:  Alert and oriented x 3. Cranial nerves II through XII intact. PSYCH:  Cognitively intact    LABORATORY DATA:      Labs dated 07/02/15: Cholesterol 122, triglycerides 101, HDL 35, LDL 67 Dated 01/12/16: A1c 4.7%, CMET and CBC normal. Dated 07/06/16: cholesterol 120, triglycerides 78, HDL 40, LDL 64. Hgb  16.2 Dated 01/23/17: A1c 5.4%. Normal chemistries. Dated 02/04/18: cholesterol 126, triglycerides 117, HDL 42, LDL 61. A1c 5.5%. Creatinine 1.1. Hgb and ALT normal Dated 07/19/18: cholesterol 121, triglycerides 92, HDL 35, LDL 68 Dated 01/28/19: Hgb 10.5. CMET normal. A1c 5.7%.  Dated 07/23/19: cholesterol 117, triglycerides 122, HDL 34, LDL 59. A1c 5.7%. creatinine 1.3. Hgb 11.4. plts 433K. Otherwise CMET and CBC normal. Dated 07/28/20: cholesterol 137, triglycerides 99. HDL 36, LDL 81. Dated 02/28/21: A1c 5.6%. LFTs normal. GFR 64.  Dated 09/04/22: cholesterol 127, triglycerides 108, HDL 35, LDL 70. A1c 5.6%. CMET normal.   Myoview  04/19/16: Study Highlights   The left ventricular ejection fraction is normal (55-65%). Nuclear stress EF: 59%. There was no ST segment deviation noted during stress. No T wave inversion was noted during stress. Blood pressure demonstrated a normal response to exercise. Defect 1: There is a small defect of severe severity present in the basal inferolateral and mid inferolateral location. Findings consistent with prior  myocardial infarction with peri-infarct ischemia. This is an intermediate risk study.    Assessment / Plan:  1. Coronary disease with history of occlusion of the mid left circumflex coronary in 2007. He is asymptomatic. Myoview  study in June of 2018 was unchanged from prior studies with fixed inferolateral defect. Continue current therapy with Plavix , statin, and beta blocker therapy.   2. Hypertension-well controlled per home readings.  Continue ACE inhibitor and beta blocker therapy.  3. Dyslipidemia patient is on high-dose simvastatin   therapy. Last LDL 70.    4. CVA  on Plavx. No recurrence  Follow up in one year.

## 2023-09-18 ENCOUNTER — Ambulatory Visit: Attending: Cardiology | Admitting: Cardiology

## 2023-09-18 ENCOUNTER — Encounter: Payer: Self-pay | Admitting: Cardiology

## 2023-09-18 VITALS — BP 146/80 | HR 91 | Ht 73.0 in | Wt 188.4 lb

## 2023-09-18 DIAGNOSIS — I10 Essential (primary) hypertension: Secondary | ICD-10-CM | POA: Diagnosis not present

## 2023-09-18 DIAGNOSIS — I2510 Atherosclerotic heart disease of native coronary artery without angina pectoris: Secondary | ICD-10-CM | POA: Diagnosis not present

## 2023-09-18 DIAGNOSIS — E785 Hyperlipidemia, unspecified: Secondary | ICD-10-CM | POA: Insufficient documentation

## 2023-09-18 NOTE — Patient Instructions (Signed)

## 2023-09-18 NOTE — Progress Notes (Signed)
 Order(s) created erroneously. Erroneous order ID: [id number]  Order moved by: STONEY MAGALENE FALCON  Order move date/time: 09/18/2023 1:55 PM  Source Patient: [id number]  Source Contact: 09/18/2023  Destination Patient: [id number]  Destination Contact: 08/23/2022

## 2023-09-20 DIAGNOSIS — N1831 Chronic kidney disease, stage 3a: Secondary | ICD-10-CM | POA: Diagnosis not present

## 2023-09-20 DIAGNOSIS — Z0000 Encounter for general adult medical examination without abnormal findings: Secondary | ICD-10-CM | POA: Diagnosis not present

## 2023-09-20 DIAGNOSIS — Z1339 Encounter for screening examination for other mental health and behavioral disorders: Secondary | ICD-10-CM | POA: Diagnosis not present

## 2023-09-20 DIAGNOSIS — R82998 Other abnormal findings in urine: Secondary | ICD-10-CM | POA: Diagnosis not present

## 2023-09-20 DIAGNOSIS — J309 Allergic rhinitis, unspecified: Secondary | ICD-10-CM | POA: Diagnosis not present

## 2023-09-20 DIAGNOSIS — I679 Cerebrovascular disease, unspecified: Secondary | ICD-10-CM | POA: Diagnosis not present

## 2023-09-20 DIAGNOSIS — Z1331 Encounter for screening for depression: Secondary | ICD-10-CM | POA: Diagnosis not present

## 2023-09-20 DIAGNOSIS — E663 Overweight: Secondary | ICD-10-CM | POA: Diagnosis not present

## 2023-09-20 DIAGNOSIS — D126 Benign neoplasm of colon, unspecified: Secondary | ICD-10-CM | POA: Diagnosis not present

## 2023-09-20 DIAGNOSIS — R7302 Impaired glucose tolerance (oral): Secondary | ICD-10-CM | POA: Diagnosis not present

## 2023-09-20 DIAGNOSIS — E7800 Pure hypercholesterolemia, unspecified: Secondary | ICD-10-CM | POA: Diagnosis not present

## 2023-09-20 DIAGNOSIS — I129 Hypertensive chronic kidney disease with stage 1 through stage 4 chronic kidney disease, or unspecified chronic kidney disease: Secondary | ICD-10-CM | POA: Diagnosis not present

## 2023-09-20 DIAGNOSIS — I252 Old myocardial infarction: Secondary | ICD-10-CM | POA: Diagnosis not present

## 2023-09-20 DIAGNOSIS — H353232 Exudative age-related macular degeneration, bilateral, with inactive choroidal neovascularization: Secondary | ICD-10-CM | POA: Diagnosis not present

## 2023-09-20 DIAGNOSIS — I872 Venous insufficiency (chronic) (peripheral): Secondary | ICD-10-CM | POA: Diagnosis not present

## 2023-09-20 DIAGNOSIS — I2510 Atherosclerotic heart disease of native coronary artery without angina pectoris: Secondary | ICD-10-CM | POA: Diagnosis not present

## 2023-09-20 DIAGNOSIS — Z9049 Acquired absence of other specified parts of digestive tract: Secondary | ICD-10-CM | POA: Diagnosis not present

## 2023-10-03 ENCOUNTER — Other Ambulatory Visit: Payer: Self-pay | Admitting: Cardiovascular Disease

## 2023-10-29 ENCOUNTER — Other Ambulatory Visit: Payer: Self-pay | Admitting: Cardiology

## 2023-12-22 NOTE — Progress Notes (Signed)
°  History of Present Illness 83 yo man with burned out scarred wet AMD both eyes here for f/u.  He reports a stable condition with no recent hospitalizations. He has been visiting his friend in the hospital almost daily for the past 3 to 4 months. He maintains an active lifestyle, including daily reading. He has observed that one eye appears to be more affected than the other, and he experiences tearing in his eyes after prolonged reading sessions. He does not have diabetes, as confirmed by a recent physical examination.  Physical Exam Slit Lamp Exam Right Eye Lids:ptosis  Lens: PCIOL and clear capsule  Left Eye Lid-ptosis Lens: PCIOL and open capsule Vitreous: PVD  Fundus Exam Right Eye CD Ratio: 0.5 Macula: RPE scarring and atrophy Periphery: Normal  Left Eye Macula: Subretinal scarring and mottling, dry Periphery: Normal  Otherwise exam findings normal OU  Results Imaging  - OCT bilaterally: Shows dry AMD with a double line sign, but no exudation, no intra or subretinal fluid. No deterioration compared to pictures of June.  Assessment & Plan 1. Age-related macular degeneration (AMD), both eyes.   AGE-RELATED MACULAR DEGENERATION: The nature of age-related macular degeneration was discussed with the patient as well as the distinction between dry and wet types. Checking an Amsler grid with advice to return immediately should it change was given. The patient's smoking status now and in the past was determined and advice based on the age related eye disease studies was provided regarding consumption of anti-oxidant supplements. Consumption of dark leafy green vegetables, fresh fruits of various colors, and oily ocean fish was recommended. Treatment modalities for wet macular degeneration including intravitreal injection of anti-VEGF drugs, various investigational modalities including other drugs, and less commonly thermal laser or photodynamic therapy, were discussed with the  patient. After discussion and consideration of many factors, including first or second eye status, financial considerations, transportation constraints and their impact on multiple follow-up visits and the possible need of a series of injections, the patient's interest in research protocols, and the patient's preferences, a decision was reached on treatment or observation, and a particular modality. All the patient's questions were answered. If low vision consultation was indicated, it was recommended.   Stable. Consume greens, fish, and take AREDS2 vitamins. Check vision daily, one eye at a time, to detect any changes.  2. Ptosis, right upper lid. Mild ptosis observed.  3. Posterior chamber intraocular lens (PCIOL), both eyes. PCIOL observed with clear capsule in the left eye and open capsule in the right eye.  4. Posterior vitreous detachment (PVD), left eye. PVD observed.  Follow-up in 6 months.  Alm JINNY Schlossman, MD

## 2023-12-26 DIAGNOSIS — H02403 Unspecified ptosis of bilateral eyelids: Secondary | ICD-10-CM | POA: Diagnosis not present

## 2023-12-26 DIAGNOSIS — H353232 Exudative age-related macular degeneration, bilateral, with inactive choroidal neovascularization: Secondary | ICD-10-CM | POA: Diagnosis not present

## 2023-12-26 DIAGNOSIS — H353211 Exudative age-related macular degeneration, right eye, with active choroidal neovascularization: Secondary | ICD-10-CM | POA: Diagnosis not present

## 2023-12-26 DIAGNOSIS — Z961 Presence of intraocular lens: Secondary | ICD-10-CM | POA: Diagnosis not present

## 2024-02-08 ENCOUNTER — Other Ambulatory Visit: Payer: Self-pay | Admitting: Cardiology

## 2024-02-22 ENCOUNTER — Emergency Department (HOSPITAL_COMMUNITY)
Admission: EM | Admit: 2024-02-22 | Discharge: 2024-02-22 | Disposition: A | Discharge status: Home / Self Care | Attending: Emergency Medicine | Admitting: Emergency Medicine

## 2024-02-22 ENCOUNTER — Emergency Department (HOSPITAL_COMMUNITY)

## 2024-02-22 DIAGNOSIS — X58XXXD Exposure to other specified factors, subsequent encounter: Secondary | ICD-10-CM | POA: Insufficient documentation

## 2024-02-22 DIAGNOSIS — R918 Other nonspecific abnormal finding of lung field: Secondary | ICD-10-CM | POA: Diagnosis not present

## 2024-02-22 DIAGNOSIS — I2510 Atherosclerotic heart disease of native coronary artery without angina pectoris: Secondary | ICD-10-CM | POA: Diagnosis not present

## 2024-02-22 DIAGNOSIS — M4802 Spinal stenosis, cervical region: Secondary | ICD-10-CM | POA: Diagnosis not present

## 2024-02-22 DIAGNOSIS — S50312A Abrasion of left elbow, initial encounter: Secondary | ICD-10-CM | POA: Diagnosis not present

## 2024-02-22 DIAGNOSIS — Z7901 Long term (current) use of anticoagulants: Secondary | ICD-10-CM | POA: Diagnosis not present

## 2024-02-22 DIAGNOSIS — Z8673 Personal history of transient ischemic attack (TIA), and cerebral infarction without residual deficits: Secondary | ICD-10-CM | POA: Insufficient documentation

## 2024-02-22 DIAGNOSIS — W1830XA Fall on same level, unspecified, initial encounter: Secondary | ICD-10-CM | POA: Insufficient documentation

## 2024-02-22 DIAGNOSIS — Z79899 Other long term (current) drug therapy: Secondary | ICD-10-CM | POA: Insufficient documentation

## 2024-02-22 DIAGNOSIS — Z7902 Long term (current) use of antithrombotics/antiplatelets: Secondary | ICD-10-CM | POA: Diagnosis not present

## 2024-02-22 DIAGNOSIS — S0101XA Laceration without foreign body of scalp, initial encounter: Secondary | ICD-10-CM | POA: Diagnosis not present

## 2024-02-22 DIAGNOSIS — I10 Essential (primary) hypertension: Secondary | ICD-10-CM | POA: Diagnosis not present

## 2024-02-22 DIAGNOSIS — S0990XA Unspecified injury of head, initial encounter: Secondary | ICD-10-CM | POA: Diagnosis present

## 2024-02-22 DIAGNOSIS — S199XXA Unspecified injury of neck, initial encounter: Secondary | ICD-10-CM | POA: Diagnosis not present

## 2024-02-22 DIAGNOSIS — S0101XD Laceration without foreign body of scalp, subsequent encounter: Secondary | ICD-10-CM | POA: Insufficient documentation

## 2024-02-22 DIAGNOSIS — W19XXXA Unspecified fall, initial encounter: Secondary | ICD-10-CM

## 2024-02-22 DIAGNOSIS — M47812 Spondylosis without myelopathy or radiculopathy, cervical region: Secondary | ICD-10-CM | POA: Diagnosis not present

## 2024-02-22 DIAGNOSIS — S0990XD Unspecified injury of head, subsequent encounter: Secondary | ICD-10-CM | POA: Diagnosis present

## 2024-02-22 DIAGNOSIS — S51012A Laceration without foreign body of left elbow, initial encounter: Secondary | ICD-10-CM | POA: Diagnosis not present

## 2024-02-22 DIAGNOSIS — I672 Cerebral atherosclerosis: Secondary | ICD-10-CM | POA: Diagnosis not present

## 2024-02-22 MED ORDER — BACITRACIN ZINC 500 UNIT/GM EX OINT
TOPICAL_OINTMENT | Freq: Two times a day (BID) | CUTANEOUS | Status: DC
  Filled 2024-02-22: qty 0.9

## 2024-02-22 MED ORDER — LIDOCAINE-EPINEPHRINE (PF) 2 %-1:200000 IJ SOLN
INTRAMUSCULAR | Status: AC
  Filled 2024-02-22: qty 20

## 2024-02-22 NOTE — Discharge Instructions (Signed)
 Take the bandage off for the next 24 hours.  After this keep the area clean/covered.  You can apply a thin layer of bacitracin ointment for petroleum jelly to keep the area moist.

## 2024-02-22 NOTE — ED Provider Notes (Signed)
 [place] EMERGENCY DEPARTMENT AT [facility] Provider Note   CSN: [id number] Arrival date & time: 02/22/24  1614     Patient presents with: Bleeding/Bruising   [name] is a 83 y.o. male.   HPI  Patient is an 83 year old male on Plavix   He presents emergency room today for bleeding from his scalp laceration that he suffered earlier today.  He had CT imaging of the head at that time.       Prior to Admission medications  Medication Sig Start Date End Date Taking? Authorizing Provider  clopidogrel  (PLAVIX ) 75 MG tablet  03/14/19   [provider]  clopidogrel  (PLAVIX ) 75 MG tablet Take 75 mg by mouth daily. 02/23/14   [provider]  FEROSUL 325 (65 Fe) MG tablet  04/09/19   [provider]  metoprolol  succinate (TOPROL -XL) 50 MG 24 hr tablet TAKE 1 TABLET EVERY DAY 02/13/24   Jordan, Peter M, MD  Multiple Vitamin (MULTIVITAMIN) tablet Take 1 tablet by mouth daily.    [provider]  nitroGLYCERIN (NITROSTAT) 0.4 MG SL tablet Place 0.4 mg under the tongue every 5 (five) minutes as needed for chest pain. 07/07/14   [provider]  ramipril  (ALTACE ) 10 MG capsule TAKE 1 CAPSULE EVERY DAY (KEEP YOUR UPCOMING OFFICE VISIT ON 09/18/2023 WITH DR JORDAN FOR FUTURE REFILL) 10/04/23   Jordan, Peter M, MD  simvastatin  (ZOCOR ) 80 MG tablet TAKE 1 TABLET EVERY DAY AT 6PM 10/29/23   Jordan, Peter M, MD  White Petrolatum-Mineral Oil (ULTRA FRESH PM) OINT Apply 1 drop to eye 3 (three) times daily as needed. 04/11/16   [provider]    Allergies: Ampicillin -sulbactam sodium     Review of Systems  Updated Vital Signs BP 114/73   Pulse 80   Temp 98.2 F (36.8 C) (Oral)   Resp 17   SpO2 97%   Physical Exam Vitals and nursing note reviewed.  Constitutional:      General: He is not in acute distress.    Appearance: Normal appearance. He is not ill-appearing.  HENT:     Head: Normocephalic and atraumatic.  Eyes:      General: No scleral icterus.       Right eye: No discharge.        Left eye: No discharge.     Conjunctiva/sclera: Conjunctivae normal.  Pulmonary:     Effort: Pulmonary effort is normal.     Breath sounds: No stridor.  Skin:    General: Skin is warm and dry.     Comments: Laceration to the scalp without that is repaired with staples.  Small area of nonpulsatile blood oozing.  Neurological:     Mental Status: He is alert and oriented to person, place, and time. Mental status is at baseline.     (all labs ordered are listed, but only abnormal results are displayed) Labs Reviewed - No data to display  EKG: None  Radiology: CT Cervical Spine Wo Contrast Result Date: 02/22/2024 EXAM: CT CERVICAL SPINE WITHOUT CONTRAST 02/22/2024 11:47:10 AM TECHNIQUE: CT of the cervical spine was performed without the administration of intravenous contrast. Multiplanar reformatted images are provided for review. Automated exposure control, iterative reconstruction, and/or weight based adjustment of the mA/kV was utilized to reduce the radiation dose to as low as reasonably achievable. COMPARISON: None available. CLINICAL HISTORY: Neck trauma (Age >= 65y) FINDINGS: CERVICAL SPINE: BONES AND ALIGNMENT: Straightening of the normal cervical lordosis. No evidence of traumatic malalignment. Partial fusion of  the C5 and C6 vertebral bodies. No acute fracture. DEGENERATIVE CHANGES: Degenerative endplate osteophytes at multiple levels. Small disc bulge noted. Facet arthrosis and uncovertebral hypertrophy at multiple levels. Foraminal stenosis at multiple levels throughout the cervical spine, most pronounced at C6-C7. No high grade osseous spinal canal stenosis. SOFT TISSUES: No prevertebral soft tissue swelling. LUNGS: Possible focus of scarring versus irregular mass in the right lung apex which is incompletely visualized. Recommend correlation with noncontrast CT chest for further evaluation. IMPRESSION: 1. No acute  abnormality of the cervical spine. 2. Degenerative changes as above. 3. Possible right apical lung mass versus scarring, incompletely visualized. Recommend nonemergent non-contrast chest CT for full evaluation. Electronically signed by: Donnice Mania MD 02/22/2024 11:59 AM EST RP Workstation: [id number]   CT Head Wo Contrast Result Date: 02/22/2024 EXAM: CT HEAD WITHOUT 02/22/2024 11:47:10 AM TECHNIQUE: CT of the head was performed without the administration of intravenous contrast. Automated exposure control, iterative reconstruction, and/or weight based adjustment of the mA/kV was utilized to reduce the radiation dose to as low as reasonably achievable. COMPARISON: MRI head at 01/14/2014. CLINICAL HISTORY: Head trauma, minor (Age >= 65y). FINDINGS: BRAIN AND VENTRICLES: No acute intracranial hemorrhage. No mass effect or midline shift. No extra-axial fluid collection. No evidence of acute infarct. No hydrocephalus. Patchy and confluent decreased attenuation throughout deep and periventricular white matter of cerebral hemispheres bilaterally, compatible with chronic microvascular ischemic disease. Atherosclerotic calcifications within cavernous internal carotid and vertebral arteries. ORBITS: No acute abnormality. Bilateral lens replacement noted. SINUSES AND MASTOIDS: No acute abnormality. SOFT TISSUES AND SKULL: No acute skull fracture. Left frontoparietal scalp hematoma and associated laceration. IMPRESSION: 1. No acute intracranial abnormality. 2. Left frontoparietal scalp hematoma and associated laceration. Electronically signed by: Donnice Mania MD 02/22/2024 11:51 AM EST RP Workstation: [id number]     Procedures   Medications Ordered in the ED - No data to display                                  Medical Decision Making  Patient is an 83 year old male on Plavix   He presents emergency room today for bleeding from his scalp laceration that he suffered earlier today.  He had CT imaging of the  head at that time.  Pressure was applied to area of bleeding which stopped blood.  Quick alert was applied.  This was kept on wound for 15 minutes.  Afterwards this was removed and some petroleum jelly was applied.  Nonstick bandage was applied and patient was discharged home.  He feels well.   Final diagnoses:  Laceration of scalp, subsequent encounter    ED Discharge Orders     None          Neldon Hamp RAMAN, [place] 02/22/24 1818    Bari Roxie HERO, DO 02/22/24 2252

## 2024-02-22 NOTE — ED Triage Notes (Signed)
 Patient here from home reporting fall today while taking out trash. Reports falling forward and hitting head. Reports blood thinners - Plavix .

## 2024-02-22 NOTE — ED Provider Notes (Addendum)
 [place] EMERGENCY DEPARTMENT AT [facility] Provider Note   CSN: [id number] Arrival date & time: 02/22/24  1113     Patient presents with: Fall and Head Injury   [name] is a 83 y.o. male.   HPI      83yo male with history of CAD, TIA on plavix , dyslipidemia, htn, who presents with concern for fall.  Reports he was helping his sister and carrying 2 bags when the storm door struck him and he fell forward hitting his head onto the brick corner of the fireplace.  He hit is head there. No LOC.  Denies any significant ehadache. Denies other injuries, including no neck pain, back pain, abdominal pain nor chest pain. Has otherwise been in normal state of health, no fever, no cough, no dizziness. He had a lot of bleeding from his head right away.  He put some pressure on it Iand then drove himself to the hospital.   Past Medical History:  Diagnosis Date   Acute cholecystitis 02/14/2012   Anginal pain    CAD (coronary artery disease) 2007   occlusion of mid LCX, 50-70% LAD   Cataract 2010   bilateral eyes   Choledocholithiasis 02/14/2012   Dyslipidemia    HTN (hypertension)    Macular degeneration    Myocardial infarction (HCC) 1/07   Acute non-ST-elevation myocardial infarction   Overweight(278.02)    with body mass index of 29   TIA (transient ischemic attack)    PER PATIENT , HIS EYE DOCTOR SAW ABNORMALILTY DURING EYE EXAM . TOLD HIM HE HAD A SMALL STROKE  AND THAT WAS THE CAUSE OF A C/O DYSPHAGIA   AT THE TIME, NO RECURRENCE  SINCE THAT TIME, DYSPHAGIA  NOW SINCE IMPROVED  WITH AVOIDANCE OF STRIGNY FOODS      Prior to Admission medications  Medication Sig Start Date End Date Taking? Authorizing Provider  clopidogrel  (PLAVIX ) 75 MG tablet  03/14/19   [provider]  clopidogrel  (PLAVIX ) 75 MG tablet Take 75 mg by mouth daily. 02/23/14   [provider]  FEROSUL 325 (65 Fe) MG tablet  04/09/19   [provider]  metoprolol   succinate (TOPROL -XL) 50 MG 24 hr tablet TAKE 1 TABLET EVERY DAY 02/13/24   Jordan, Peter M, MD  Multiple Vitamin (MULTIVITAMIN) tablet Take 1 tablet by mouth daily.    [provider]  nitroGLYCERIN (NITROSTAT) 0.4 MG SL tablet Place 0.4 mg under the tongue every 5 (five) minutes as needed for chest pain. 07/07/14   [provider]  ramipril  (ALTACE ) 10 MG capsule TAKE 1 CAPSULE EVERY DAY (KEEP YOUR UPCOMING OFFICE VISIT ON 09/18/2023 WITH DR JORDAN FOR FUTURE REFILL) 10/04/23   Jordan, Peter M, MD  simvastatin  (ZOCOR ) 80 MG tablet TAKE 1 TABLET EVERY DAY AT 6PM 10/29/23   Jordan, Peter M, MD  White Petrolatum-Mineral Oil (ULTRA FRESH PM) OINT Apply 1 drop to eye 3 (three) times daily as needed. 04/11/16   [provider]    Allergies: Ampicillin -sulbactam sodium     Review of Systems  Updated Vital Signs BP (!) 158/76 (BP Location: Right Arm)   Pulse 74   Temp 97.7 F (36.5 C) (Oral)   Resp 19   Ht 6' 1 (1.854 m)   Wt 83.9 kg   SpO2 95%   BMI 24.41 kg/m   Physical Exam Vitals and nursing note reviewed.  Constitutional:      General: He is not in acute distress.  Appearance: He is well-developed. He is not diaphoretic.  HENT:     Head: Normocephalic and atraumatic.  Eyes:     Conjunctiva/sclera: Conjunctivae normal.  Cardiovascular:     Rate and Rhythm: Normal rate and regular rhythm.     Heart sounds: Normal heart sounds. No murmur heard.    No friction rub. No gallop.  Pulmonary:     Effort: Pulmonary effort is normal. No respiratory distress.     Breath sounds: Normal breath sounds. No wheezing or rales.  Abdominal:     General: There is no distension.     Palpations: Abdomen is soft.     Tenderness: There is no abdominal tenderness. There is no guarding.  Musculoskeletal:        General: No tenderness (no tenderness to c/t/l spine, nor ribs nor pelvis).     Cervical back: Normal range of motion.  Skin:    General: Skin is warm and dry.      Comments: 10cm laceration left frontal parietal 2cm area of abrasion/skin tear left elbow  Neurological:     Mental Status: He is alert and oriented to person, place, and time.     (all labs ordered are listed, but only abnormal results are displayed) Labs Reviewed - No data to display  EKG: None  Radiology: CT Cervical Spine Wo Contrast Result Date: 02/22/2024 EXAM: CT CERVICAL SPINE WITHOUT CONTRAST 02/22/2024 11:47:10 AM TECHNIQUE: CT of the cervical spine was performed without the administration of intravenous contrast. Multiplanar reformatted images are provided for review. Automated exposure control, iterative reconstruction, and/or weight based adjustment of the mA/kV was utilized to reduce the radiation dose to as low as reasonably achievable. COMPARISON: None available. CLINICAL HISTORY: Neck trauma (Age >= 65y) FINDINGS: CERVICAL SPINE: BONES AND ALIGNMENT: Straightening of the normal cervical lordosis. No evidence of traumatic malalignment. Partial fusion of the C5 and C6 vertebral bodies. No acute fracture. DEGENERATIVE CHANGES: Degenerative endplate osteophytes at multiple levels. Small disc bulge noted. Facet arthrosis and uncovertebral hypertrophy at multiple levels. Foraminal stenosis at multiple levels throughout the cervical spine, most pronounced at C6-C7. No high grade osseous spinal canal stenosis. SOFT TISSUES: No prevertebral soft tissue swelling. LUNGS: Possible focus of scarring versus irregular mass in the right lung apex which is incompletely visualized. Recommend correlation with noncontrast CT chest for further evaluation. IMPRESSION: 1. No acute abnormality of the cervical spine. 2. Degenerative changes as above. 3. Possible right apical lung mass versus scarring, incompletely visualized. Recommend nonemergent non-contrast chest CT for full evaluation. Electronically signed by: Donnice Mania MD 02/22/2024 11:59 AM EST RP Workstation: [id number]   CT Head Wo  Contrast Result Date: 02/22/2024 EXAM: CT HEAD WITHOUT 02/22/2024 11:47:10 AM TECHNIQUE: CT of the head was performed without the administration of intravenous contrast. Automated exposure control, iterative reconstruction, and/or weight based adjustment of the mA/kV was utilized to reduce the radiation dose to as low as reasonably achievable. COMPARISON: MRI head at 01/14/2014. CLINICAL HISTORY: Head trauma, minor (Age >= 65y). FINDINGS: BRAIN AND VENTRICLES: No acute intracranial hemorrhage. No mass effect or midline shift. No extra-axial fluid collection. No evidence of acute infarct. No hydrocephalus. Patchy and confluent decreased attenuation throughout deep and periventricular white matter of cerebral hemispheres bilaterally, compatible with chronic microvascular ischemic disease. Atherosclerotic calcifications within cavernous internal carotid and vertebral arteries. ORBITS: No acute abnormality. Bilateral lens replacement noted. SINUSES AND MASTOIDS: No acute abnormality. SOFT TISSUES AND SKULL: No acute skull fracture. Left frontoparietal scalp hematoma and associated laceration. IMPRESSION: 1.  No acute intracranial abnormality. 2. Left frontoparietal scalp hematoma and associated laceration. Electronically signed by: Donnice Mania MD 02/22/2024 11:51 AM EST RP Workstation: [id number]  10   .Laceration Repair  Date/Time: 02/22/2024 10:23 PM  Performed by: Dreama Longs, MD Authorized by: Dreama Longs, MD   Consent:    Consent obtained:  Verbal and emergent situation   Consent given by:  Patient   Risks, benefits, and alternatives were discussed: yes     Risks discussed:  Infection and pain Universal protocol:    Patient identity confirmed:  Verbally with patient Anesthesia:    Anesthesia method:  Local infiltration   Local anesthetic:  Lidocaine  1% w/o epi Laceration details:    Location:  Scalp   Scalp location:  Frontal   Length (cm):  10 Pre-procedure details:     Preparation:  Patient was prepped and draped in usual sterile fashion Treatment:    Area cleansed with:  Chlorhexidine    Amount of cleaning:  Standard   Irrigation solution:  Sterile saline   Irrigation method:  Syringe Skin repair:    Repair method:  Staples   Number of staples:  10 Repair type:    Repair type:  Simple Post-procedure details:    Procedure completion:  Tolerated well, no immediate complications    Medications Ordered in the ED  lidocaine -EPINEPHrine  (XYLOCAINE  W/EPI) 2 %-1:200000 (PF) injection (  Given by Other 02/22/24 1510)                                     83yo male with history of CAD, TIA on plavix , dyslipidemia, htn, who presents with concern for fall.    Given history of head trauma, age, on plavix , CT head and Cspine completed and show no sign of fracture.   No signs of other injuries, no signs of other acute illness.  Wound repaired using staples. Did have some bleeding following this that stopped with pressure.  Plan to place bacitracin, bandage and discharge with return precautions.     Final diagnoses:  Fall, initial encounter  Laceration of scalp without foreign body, initial encounter  Abrasion of left elbow, initial encounter    ED Discharge Orders     None          Dreama Longs, MD 02/22/24 [id number]    Dreama Longs, MD 02/22/24 2225

## 2024-02-22 NOTE — ED Triage Notes (Signed)
 Patient back from visit earlier with ongoing bleeding from head.

## 2024-03-03 ENCOUNTER — Other Ambulatory Visit: Payer: Self-pay | Admitting: Family Medicine

## 2024-03-03 DIAGNOSIS — R0989 Other specified symptoms and signs involving the circulatory and respiratory systems: Secondary | ICD-10-CM

## 2024-03-18 ENCOUNTER — Ambulatory Visit
Admission: RE | Admit: 2024-03-18 | Discharge: 2024-03-18 | Disposition: A | Source: Ambulatory Visit | Attending: Family Medicine | Admitting: Family Medicine

## 2024-03-18 DIAGNOSIS — R0989 Other specified symptoms and signs involving the circulatory and respiratory systems: Secondary | ICD-10-CM
